# Patient Record
Sex: Male | Born: 1955 | Race: White | Hispanic: No | Marital: Married | Smoking: Former smoker
Health system: Southern US, Community
[De-identification: ages and names within clinical notes are randomized; demographics above are authoritative.]

## PROBLEM LIST (undated history)

## (undated) DIAGNOSIS — K219 Gastro-esophageal reflux disease without esophagitis: Secondary | ICD-10-CM

## (undated) DIAGNOSIS — B019 Varicella without complication: Secondary | ICD-10-CM

## (undated) DIAGNOSIS — N289 Disorder of kidney and ureter, unspecified: Secondary | ICD-10-CM

## (undated) DIAGNOSIS — S43005A Unspecified dislocation of left shoulder joint, initial encounter: Secondary | ICD-10-CM

## (undated) DIAGNOSIS — J45909 Unspecified asthma, uncomplicated: Secondary | ICD-10-CM

## (undated) DIAGNOSIS — I519 Heart disease, unspecified: Secondary | ICD-10-CM

## (undated) DIAGNOSIS — K9041 Non-celiac gluten sensitivity: Secondary | ICD-10-CM

## (undated) DIAGNOSIS — T7840XA Allergy, unspecified, initial encounter: Secondary | ICD-10-CM

## (undated) DIAGNOSIS — L309 Dermatitis, unspecified: Secondary | ICD-10-CM

## (undated) DIAGNOSIS — I2510 Atherosclerotic heart disease of native coronary artery without angina pectoris: Secondary | ICD-10-CM

## (undated) DIAGNOSIS — E785 Hyperlipidemia, unspecified: Secondary | ICD-10-CM

## (undated) HISTORY — DX: Atherosclerotic heart disease of native coronary artery without angina pectoris: I25.10

## (undated) HISTORY — DX: Gastro-esophageal reflux disease without esophagitis: K21.9

## (undated) HISTORY — DX: Disorder of kidney and ureter, unspecified: N28.9

## (undated) HISTORY — PX: VASECTOMY: SHX75

## (undated) HISTORY — PX: OTHER SURGICAL HISTORY: SHX169

## (undated) HISTORY — PX: COLONOSCOPY: SHX174

## (undated) HISTORY — DX: Hyperlipidemia, unspecified: E78.5

## (undated) HISTORY — DX: Unspecified asthma, uncomplicated: J45.909

## (undated) HISTORY — DX: Allergy, unspecified, initial encounter: T78.40XA

## (undated) HISTORY — DX: Varicella without complication: B01.9

## (undated) HISTORY — PX: ELBOW SURGERY: SHX618

## (undated) HISTORY — DX: Non-celiac gluten sensitivity: K90.41

## (undated) HISTORY — DX: Dermatitis, unspecified: L30.9

## (undated) HISTORY — DX: Unspecified dislocation of left shoulder joint, initial encounter: S43.005A

## (undated) HISTORY — DX: Heart disease, unspecified: I51.9

---
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
## 2008-02-03 HISTORY — PX: TOE SURGERY: SHX1073

## 2010-04-15 DIAGNOSIS — K9041 Non-celiac gluten sensitivity: Secondary | ICD-10-CM | POA: Insufficient documentation

## 2010-04-15 DIAGNOSIS — K219 Gastro-esophageal reflux disease without esophagitis: Secondary | ICD-10-CM | POA: Insufficient documentation

## 2010-04-15 DIAGNOSIS — J302 Other seasonal allergic rhinitis: Secondary | ICD-10-CM | POA: Insufficient documentation

## 2010-09-17 DIAGNOSIS — M6740 Ganglion, unspecified site: Secondary | ICD-10-CM | POA: Insufficient documentation

## 2010-09-17 HISTORY — DX: Ganglion, unspecified site: M67.40

## 2013-12-21 ENCOUNTER — Ambulatory Visit: Payer: Self-pay | Admitting: Family Medicine

## 2015-11-06 ENCOUNTER — Encounter: Payer: Self-pay | Admitting: Gastroenterology

## 2015-11-27 ENCOUNTER — Ambulatory Visit (AMBULATORY_SURGERY_CENTER): Payer: Self-pay | Admitting: *Deleted

## 2015-11-27 VITALS — Ht 65.0 in | Wt 152.0 lb

## 2015-11-27 DIAGNOSIS — Z1211 Encounter for screening for malignant neoplasm of colon: Secondary | ICD-10-CM

## 2015-11-27 MED ORDER — NA SULFATE-K SULFATE-MG SULF 17.5-3.13-1.6 GM/177ML PO SOLN: ORAL | 0 refills | Status: DC

## 2015-11-27 NOTE — Progress Notes (Signed)
Patient denies any allergies to eggs or soy. Patient denies any problems with anesthesia/sedation. Patient denies any oxygen use at home and does not take any diet/weight loss medications.  

## 2015-12-19 ENCOUNTER — Encounter: Payer: Self-pay | Admitting: Gastroenterology

## 2015-12-24 ENCOUNTER — Encounter: Payer: Self-pay | Admitting: Gastroenterology

## 2015-12-30 ENCOUNTER — Encounter: Payer: Self-pay | Admitting: Gastroenterology

## 2015-12-30 ENCOUNTER — Ambulatory Visit (AMBULATORY_SURGERY_CENTER): Payer: Managed Care, Other (non HMO) | Admitting: Gastroenterology

## 2015-12-30 VITALS — BP 112/63 | HR 59 | Temp 97.1°F | Resp 11 | Ht 65.0 in | Wt 152.0 lb

## 2015-12-30 DIAGNOSIS — Z1211 Encounter for screening for malignant neoplasm of colon: Secondary | ICD-10-CM

## 2015-12-30 DIAGNOSIS — Z1212 Encounter for screening for malignant neoplasm of rectum: Secondary | ICD-10-CM | POA: Diagnosis not present

## 2015-12-30 DIAGNOSIS — K649 Unspecified hemorrhoids: Secondary | ICD-10-CM | POA: Diagnosis not present

## 2015-12-30 MED ORDER — SODIUM CHLORIDE 0.9 % IV SOLN: 500.0000 mL | INTRAVENOUS | Status: DC

## 2015-12-30 NOTE — Op Note (Signed)
[facility] Patient Name: Eric ChildsDaniel [name] Procedure Date: 12/30/2015 8:12 AM MRN: [id number] Endoscopist: Rachael Feeaniel P Jacobs , MD Age: 60 Referring MD:  Date of Birth: 12/06/1955 Gender: Male Account #: [id number] Procedure:                Colonoscopy Indications:              Screening for colorectal malignant neoplasm;                            colonoscopy 10 years ago (elsewhere) was normal Medicines:                Monitored Anesthesia Care Procedure:                Pre-Anesthesia Assessment:                           - Prior to the procedure, a History and Physical                            was performed, and patient medications and                            allergies were reviewed. The patient's tolerance of                            previous anesthesia was also reviewed. The risks                            and benefits of the procedure and the sedation                            options and risks were discussed with the patient.                            All questions were answered, and informed consent                            was obtained. Prior Anticoagulants: The patient has                            taken no previous anticoagulant or antiplatelet                            agents. ASA Grade Assessment: II - A patient with                            mild systemic disease. After reviewing the risks                            and benefits, the patient was deemed in                            satisfactory condition to undergo the procedure.  After obtaining informed consent, the colonoscope                            was passed under direct vision. Throughout the                            procedure, the patient's blood pressure, pulse, and                            oxygen saturations were monitored continuously. The                            Model CF-HQ190L [id number]) scope was introduced                            through the anus and  advanced to the the cecum,                            identified by appendiceal orifice and ileocecal                            valve. The colonoscopy was performed without                            difficulty. The patient tolerated the procedure                            well. The quality of the bowel preparation was                            excellent. The ileocecal valve, appendiceal                            orifice, and rectum were photographed. Scope In: 8:24:35 AM Scope Out: 8:32:29 AM Scope Withdrawal Time: 0 hours 6 minutes 3 seconds  Total Procedure Duration: 0 hours 7 minutes 54 seconds  Findings:                 Internal hemorrhoids were found. The hemorrhoids                            were small.                           The exam was otherwise without abnormality on                            direct and retroflexion views. Complications:            No immediate complications. Estimated blood loss:                            None. Estimated Blood Loss:     Estimated blood loss: none. Impression:               - Internal hemorrhoids.                           -  The examination was otherwise normal on direct                            and retroflexion views.                           - No polyps or cancers. Recommendation:           - Patient has a contact number available for                            emergencies. The signs and symptoms of potential                            delayed complications were discussed with the                            patient. Return to normal activities tomorrow.                            Written discharge instructions were provided to the                            patient.                           - Resume previous diet.                           - Continue present medications.                           - Repeat colonoscopy in 10 years for screening                            purposes. There is no need for colon cancer                             screening by any method (including stool testing)                            prior to then. Rachael Fee, MD 12/30/2015 8:35:11 AM This report has been signed electronically.

## 2015-12-30 NOTE — Progress Notes (Signed)
Report given to PACU RN, vss 

## 2015-12-30 NOTE — Patient Instructions (Signed)
YOU HAD AN ENDOSCOPIC PROCEDURE TODAY AT THE [facility]:   Refer to the procedure report that was given to you for any specific questions about what was found during the examination.  If the procedure report does not answer your questions, please call your gastroenterologist to clarify.  If you requested that your care partner not be given the details of your procedure findings, then the procedure report has been included in a sealed envelope for you to review at your convenience later.  YOU SHOULD EXPECT: Some feelings of bloating in the abdomen. Passage of more gas than usual.  Walking can help get rid of the air that was put into your GI tract during the procedure and reduce the bloating. If you had a lower endoscopy (such as a colonoscopy or flexible sigmoidoscopy) you may notice spotting of blood in your stool or on the toilet paper. If you underwent a bowel prep for your procedure, you may not have a normal bowel movement for a few days.  Please Note:  You might notice some irritation and congestion in your nose or some drainage.  This is from the oxygen used during your procedure.  There is no need for concern and it should clear up in a day or so.  SYMPTOMS TO REPORT IMMEDIATELY:   Following lower endoscopy (colonoscopy or flexible sigmoidoscopy):  Excessive amounts of blood in the stool  Significant tenderness or worsening of abdominal pains  Swelling of the abdomen that is new, acute  Fever of 100F or higher  For urgent or emergent issues, a gastroenterologist can be reached at any hour by calling [phone].  DIET:  We do recommend a small meal at first, but then you may proceed to your regular diet.  Drink plenty of fluids but you should avoid alcoholic beverages for 24 hours.  ACTIVITY:  You should plan to take it easy for the rest of today and you should NOT DRIVE or use heavy machinery until tomorrow (because of the sedation medicines used during the test).     FOLLOW UP: Our staff will call the number listed on your records the next business day following your procedure to check on you and address any questions or concerns that you may have regarding the information given to you following your procedure. If we do not reach you, we will leave a message.  However, if you are feeling well and you are not experiencing any problems, there is no need to return our call.  We will assume that you have returned to your regular daily activities without incident.  SIGNATURES/CONFIDENTIALITY: You and/or your care partner have signed paperwork which will be entered into your electronic medical record.  These signatures attest to the fact that that the information above on your After Visit Summary has been reviewed and is understood.  Full responsibility of the confidentiality of this discharge information lies with you and/or your care-partner.  Please continue your normal medications  Next colonoscopy- 10 years  Please read over handouts about hemorrhoids and high fiber diet

## 2015-12-31 ENCOUNTER — Telehealth: Payer: Self-pay

## 2015-12-31 NOTE — Telephone Encounter (Signed)
  Follow up Call-  Call back number 12/30/2015  Post procedure Call Back phone  # [phone]  Permission to leave phone message Yes  Some recent data might be hidden    Patient was called for follow up after his procedure on 12/30/2015. No answer at the number given for follow up phone call. A message was left on the answering machine.

## 2015-12-31 NOTE — Telephone Encounter (Signed)
  Follow up Call-  Call back number 12/30/2015  Post procedure Call Back phone  # [phone]  Permission to leave phone message Yes  Some recent data might be hidden    Patient was called for follow up after her procedure on 12/30/2015. No answer at the number given for follow up phone call. A message was left on the answering machine.

## 2016-06-22 ENCOUNTER — Telehealth: Payer: Self-pay

## 2016-06-22 NOTE — Telephone Encounter (Signed)
SENT NOTES TO SCHEDULING 

## 2016-06-24 ENCOUNTER — Telehealth: Payer: Self-pay | Admitting: Cardiology

## 2016-06-24 NOTE — Telephone Encounter (Signed)
Received records from [facility] for appointment on 07/23/16 with Dr Herbie BaltimoreHarding.  Records put with Dr Elissa HeftyHarding's schedule for 07/23/16. lp

## 2016-07-23 ENCOUNTER — Encounter: Payer: Self-pay | Admitting: Cardiology

## 2016-07-23 ENCOUNTER — Ambulatory Visit (INDEPENDENT_AMBULATORY_CARE_PROVIDER_SITE_OTHER): Payer: Managed Care, Other (non HMO) | Admitting: Cardiology

## 2016-07-23 VITALS — BP 139/95 | HR 59 | Ht 65.0 in | Wt 161.0 lb

## 2016-07-23 DIAGNOSIS — E785 Hyperlipidemia, unspecified: Secondary | ICD-10-CM | POA: Insufficient documentation

## 2016-07-23 DIAGNOSIS — Z8249 Family history of ischemic heart disease and other diseases of the circulatory system: Secondary | ICD-10-CM | POA: Diagnosis not present

## 2016-07-23 HISTORY — DX: Hyperlipidemia, unspecified: E78.5

## 2016-07-23 NOTE — Progress Notes (Signed)
PCP: Shirlean MylarWebb, Carol, MD  Clinic Note: Chief Complaint  Patient presents with  . New Evaluation    family history of CAD    HPI: Eric [name] is a 61 y.o. male who is being seen today for the evaluation of baseline CVD risk with Fam Hx of CAD at the request of Shirlean MylarWebb, Carol, MD. Eric [name] himself is been relatively healthy, but is concerned about his extensive family history of CAD noted below. His father, 4 paternal uncles and paternal grandfather all with MIs and most with early deaths related to CAD. He himself has lived longer than most of his paternal uncles. He is very active exercising routinely, and has not had any sensation of anginal symptoms. [place]  Eric [name] was last seen on May 18 by Dr. Hyman HopesWebb for routine follow-up visit/preventive medicine. He noted that time having some decreased exercise tolerance and is concerned about his risk factors. He is therefore referred for baseline screening.  Recent Hospitalizations: None  Studies Personally Reviewed - (if available, images/films reviewed: From Epic Chart or Care Everywhere)  None  Interval History: Eric [name] presents here today noting maybe just a little bit of decreased exercise tolerance. He routinely does running, cycling was swimming and golf. Over the last year or so he is tolerance to exercises on more notably. He still is able to run may be 3 miles a day, but ever since he had a significant lifestyle changes because of his job changing with more frequent traveling and less ability to exercise. Since then try to get back to where he was, he is not able to run the 8 and half-9 minute miles that he used to be a do, is only doing 10 minute miles. The is barely able to do his 50 mile bike rides. She used to be routine. Basically what is noting now as he is not improving as fast as he used to and is seemed to plateau sooner with his heart rate and energy level. He sometimes has to stop. Overall  he thinks it is getting better, but he is low but concerned with his family history that his inability to improve is a sign of developing coronary disease. He also notes that some of this exercise intolerance is related to bilateral (R>L) knee pain  No chest pain or shortness of breath with rest or exertion.  No PND, orthopnea or edema. No palpitations, lightheadedness, dizziness, weakness or syncope/near syncope. No TIA/amaurosis fugax symptoms. No melena, hematochezia, hematuria, or epstaxis. No claudication.  ROS: A comprehensive was performed. Review of Systems  Constitutional: Negative for malaise/fatigue.  HENT: Negative for nosebleeds.   Respiratory: Negative for cough, shortness of breath and wheezing.   Cardiovascular: Negative.   Gastrointestinal: Negative for abdominal pain and constipation.  Genitourinary: Negative for dysuria and frequency.  Musculoskeletal: Positive for joint pain (Knee pain).  Neurological: Negative for dizziness and weakness.  Psychiatric/Behavioral: Negative.   All other systems reviewed and are negative.   I have reviewed and (if needed) personally updated the patient's problem list, medications, allergies, past medical and surgical history, social and family history.   Past Medical History:  Diagnosis Date  . Coronary artery disease   . Dislocation of shoulder, left, closed   . GERD (gastroesophageal reflux disease)   . Gluten intolerance    Leads to diffuse GI issues    Past Surgical History:  Procedure Laterality Date  . COLONOSCOPY  10 years ago=Normal  done in [place]; pt unsure MD name, he will look for records.  Marland Kitchen MOUTH SURGERY  1965  . TOE SURGERY      Current Meds  Medication Sig  . omeprazole (PRILOSEC) 40 MG capsule Take 40 mg by mouth daily.   Current Facility-Administered Medications for the 07/23/16 encounter (Office Visit) with [name], MD  Medication  . 0.9 %  sodium chloride infusion    Allergies  Allergen  Reactions  . Gluten Meal Diarrhea and Other (See Comments)    GI    Social History   Social History  . Marital status: Married    Spouse name: N/A  . Number of children: 4  . Years of education: N/A   Occupational History  . Surveyor, mining   Social History Main Topics  . Smoking status: Former Games developer  . Smokeless tobacco: Never Used  . Alcohol use 4.2 oz/week    7 Glasses of wine per week  . Drug use: No  . Sexual activity: Not Asked   Other Topics Concern  . None   Social History Narrative   He is been married for 37 years. Therefore children and 1 grandchild. He does the son and spouse. 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   He never smoked (unless you count when he was 61 years old) he has been exposed to passive smoke with his parents.   He drinks roughly 14 alcohol drinks usually red wine a week.   Used marijuana in high school but none since.   He is an avid exercise enthusiast who does running, bicycling and swimming at least 5 days a week for 40 minutes in time.    family history includes Breast cancer in his mother; Coronary artery disease in his paternal uncle and paternal uncle; Dementia in his father; Heart attack in his father, paternal uncle, and paternal uncle; Heart disease in his father and paternal grandfather; Hypertension in his brother and paternal uncle; Prostate cancer in his father.  Wt Readings from Last 3 Encounters:  07/23/16 161 lb (73 kg)  12/30/15 152 lb (68.9 kg)  11/27/15 152 lb (68.9 kg)    PHYSICAL EXAM BP (!) 139/95 (BP Location: Left Arm, Patient Position: Sitting, Cuff Size: Normal)   Pulse (!) 59   Ht 5\' 5"  (1.651 m)   Wt 161 lb (73 kg)   BMI 26.79 kg/m  General appearance: alert, cooperative, appears stated age, no distress. Well-nourished, well-groomed. Healthy-appearing HEENT: [place]/AT, EOMI, MMM, anicteric sclera Neck: no adenopathy, no carotid bruit  and no JVD Lungs: clear to auscultation bilaterally, normal percussion bilaterally and non-labored Heart: regular rate and rhythm, S1 &S2 normal, no murmur, click, rub or gallop; nondisplaced PMI Abdomen: soft, non-tender; bowel sounds normal; no masses,  no organomegaly; no HJR Extremities: extremities normal, atraumatic, no cyanosis, or edema  Pulses: 2+ and symmetric;  Skin: mobility and turgor normal, no evidence of bleeding or bruising and no lesions noted  Neurologic: Mental status: Alert & oriented x 3, thought content appropriate; non-focal exam.  Pleasant mood & affect. Cranial nerves: normal (II-XII grossly intact)    Adult ECG Report  Rate: 58 ;  Rhythm: sinus bradycardia and Normal axis, intervals and durations;   Narrative Interpretation: Relatively normal   Other studies Reviewed: Additional studies/ records that were reviewed today include:  Recent Labs:  None available  ASSESSMENT / PLAN: Problem List Items Addressed This Visit    Family history of premature CAD - Primary (Chronic)    Pretty asymptomatic, unlikely to have obstructive coronary disease, however for risk stratification, I think is reasonable to evaluate with a coronary calcium score. Depending on what this shows, we may need to be more aggressive lipid management for instance.  Otherwise he continues to be very active with no symptoms. He is not on any cardiac meds because of. I did suggest a daily aspirin.      Relevant Orders   EKG 12-Lead (Completed)   CT CARDIAC SCORING   Hyperlipidemia with target LDL less than 100 (Chronic)    He cannot recall last time his lipids were checked, but for many years he decided that he did not want to take statins because actually intended to lower his HDL but not necessarily LDL. He therefore is leery of using them again future.  We may need to adjust this process pending results of his coronary calcium score.      Relevant Orders   EKG 12-Lead (Completed)        Current medicines are reviewed at length with the patient today. (+/- concerns) n/a The following changes have been made: n/a  Patient Instructions  SCHEDULE AT [address]  Your physician has requested that you have CORONARY Calcium scoring. Cardiac computed tomography (CT) is a painless test that uses an x-ray machine to take clear, detailed pictures of your heart. For further information please visit [email or web address] Please follow instruction sheet as given.    No other changes at present  MAY USE CoQ10 300 MG  DAILY- INCREASE BY 100 MG EVERY 2 WEEKS UNTIL YOU REACH 300 MG DAILY  Your physician recommends that you schedule a follow-up appointment in 1 month with DR Sharri Loya- AFTER TEST COMPLETE.     Studies Ordered:   Orders Placed This Encounter  Procedures  . CT CARDIAC SCORING  . EKG 12-Lead      Bryan Lemma, M.D., M.S. Interventional Cardiologist   Pager # [phone] Phone # [phone] [address] [place], [place] [address]

## 2016-07-23 NOTE — Patient Instructions (Addendum)
SCHEDULE AT [address]  Your physician has requested that you have CORONARY Calcium scoring. Cardiac computed tomography (CT) is a painless test that uses an x-ray machine to take clear, detailed pictures of your heart. For further information please visit [email or web address] Please follow instruction sheet as given.    No other changes at present  MAY USE CoQ10 300 MG  DAILY- INCREASE BY 100 MG EVERY 2 WEEKS UNTIL YOU REACH 300 MG DAILY  Your physician recommends that you schedule a follow-up appointment in 1 month with DR HARDING- AFTER TEST COMPLETE.

## 2016-07-24 ENCOUNTER — Encounter: Payer: Self-pay | Admitting: Cardiology

## 2016-07-24 NOTE — Assessment & Plan Note (Signed)
Pretty asymptomatic, unlikely to have obstructive coronary disease, however for risk stratification, I think is reasonable to evaluate with a coronary calcium score. Depending on what this shows, we may need to be more aggressive lipid management for instance.  Otherwise he continues to be very active with no symptoms. He is not on any cardiac meds because of. I did suggest a daily aspirin.

## 2016-07-24 NOTE — Assessment & Plan Note (Signed)
He cannot recall last time his lipids were checked, but for many years he decided that he did not want to take statins because actually intended to lower his HDL but not necessarily LDL. He therefore is leery of using them again future.  We may need to adjust this process pending results of his coronary calcium score.

## 2016-08-07 ENCOUNTER — Other Ambulatory Visit: Payer: Managed Care, Other (non HMO)

## 2016-08-10 ENCOUNTER — Ambulatory Visit (INDEPENDENT_AMBULATORY_CARE_PROVIDER_SITE_OTHER)
Admission: RE | Admit: 2016-08-10 | Discharge: 2016-08-10 | Disposition: A | Discharge status: Home / Self Care | Payer: Self-pay | Source: Ambulatory Visit | Attending: Cardiology | Admitting: Cardiology

## 2016-08-10 DIAGNOSIS — Z8249 Family history of ischemic heart disease and other diseases of the circulatory system: Secondary | ICD-10-CM

## 2016-08-13 ENCOUNTER — Encounter: Payer: Self-pay | Admitting: Cardiology

## 2016-08-13 DIAGNOSIS — Z8249 Family history of ischemic heart disease and other diseases of the circulatory system: Secondary | ICD-10-CM

## 2016-08-13 DIAGNOSIS — R931 Abnormal findings on diagnostic imaging of heart and coronary circulation: Secondary | ICD-10-CM

## 2016-08-17 ENCOUNTER — Encounter: Payer: Self-pay | Admitting: Cardiology

## 2016-08-18 ENCOUNTER — Telehealth: Payer: Self-pay | Admitting: Cardiology

## 2016-08-18 NOTE — Telephone Encounter (Signed)
New Message  Pt call requesting to speak with RN. Pt states he was asked to call office after 11am today per Dr. Herbie BaltimoreHarding. Please call back to discuss

## 2016-08-18 NOTE — Telephone Encounter (Signed)
Dr Harding's pt Eric [name]  

## 2016-08-18 NOTE — Telephone Encounter (Signed)
The caution and put forward for me. I talked to him for a long time

## 2016-08-21 ENCOUNTER — Encounter: Payer: Self-pay | Admitting: Cardiology

## 2016-08-21 ENCOUNTER — Telehealth: Payer: Self-pay

## 2016-08-21 DIAGNOSIS — Z8249 Family history of ischemic heart disease and other diseases of the circulatory system: Secondary | ICD-10-CM

## 2016-08-21 DIAGNOSIS — Z01818 Encounter for other preprocedural examination: Secondary | ICD-10-CM

## 2016-08-21 NOTE — Telephone Encounter (Signed)
Patient needs BMET before CT.

## 2016-08-28 ENCOUNTER — Other Ambulatory Visit: Payer: Managed Care, Other (non HMO) | Admitting: *Deleted

## 2016-08-28 ENCOUNTER — Other Ambulatory Visit: Payer: Self-pay

## 2016-08-28 DIAGNOSIS — Z8249 Family history of ischemic heart disease and other diseases of the circulatory system: Secondary | ICD-10-CM

## 2016-08-28 DIAGNOSIS — Z01818 Encounter for other preprocedural examination: Secondary | ICD-10-CM

## 2016-08-28 DIAGNOSIS — R931 Abnormal findings on diagnostic imaging of heart and coronary circulation: Secondary | ICD-10-CM

## 2016-08-28 LAB — BASIC METABOLIC PANEL
BUN/Creatinine Ratio: 20 (ref 10–24)
BUN: 23 mg/dL (ref 8–27)
CHLORIDE: 103 mmol/L (ref 96–106)
CO2: 21 mmol/L (ref 20–29)
Calcium: 9.4 mg/dL (ref 8.6–10.2)
Creatinine, Ser: 1.13 mg/dL (ref 0.76–1.27)
GFR calc Af Amer: 81 mL/min/{1.73_m2} (ref >59)
GFR calc non Af Amer: 70 mL/min/{1.73_m2} (ref >59)
GLUCOSE: 94 mg/dL (ref 65–99)
POTASSIUM: 4.5 mmol/L (ref 3.5–5.2)
SODIUM: 139 mmol/L (ref 134–144)

## 2016-08-28 NOTE — Addendum Note (Signed)
Addended by: Virl AxePATE INGALLS, Dorcas Melito L on: 08/28/2016 11:07 AM   Modules accepted: Orders

## 2016-09-02 ENCOUNTER — Ambulatory Visit: Payer: Managed Care, Other (non HMO) | Admitting: Cardiology

## 2016-09-03 ENCOUNTER — Ambulatory Visit (HOSPITAL_COMMUNITY): Admission: RE | Admit: 2016-09-03 | Payer: Managed Care, Other (non HMO) | Source: Ambulatory Visit

## 2016-09-03 ENCOUNTER — Encounter (HOSPITAL_COMMUNITY): Payer: Self-pay

## 2016-09-03 ENCOUNTER — Ambulatory Visit (HOSPITAL_COMMUNITY)
Admission: RE | Admit: 2016-09-03 | Discharge: 2016-09-03 | Disposition: A | Discharge status: Home / Self Care | Payer: Managed Care, Other (non HMO) | Source: Ambulatory Visit | Attending: Cardiology | Admitting: Cardiology

## 2016-09-03 DIAGNOSIS — Z8249 Family history of ischemic heart disease and other diseases of the circulatory system: Secondary | ICD-10-CM | POA: Diagnosis present

## 2016-09-03 DIAGNOSIS — I2510 Atherosclerotic heart disease of native coronary artery without angina pectoris: Secondary | ICD-10-CM | POA: Diagnosis not present

## 2016-09-03 DIAGNOSIS — R931 Abnormal findings on diagnostic imaging of heart and coronary circulation: Secondary | ICD-10-CM | POA: Diagnosis not present

## 2016-09-03 DIAGNOSIS — R079 Chest pain, unspecified: Secondary | ICD-10-CM

## 2016-09-03 MED ORDER — NITROGLYCERIN 0.4 MG SL SUBL
SUBLINGUAL_TABLET | SUBLINGUAL | Status: AC
  Filled 2016-09-03: qty 2

## 2016-09-03 MED ORDER — IOPAMIDOL (ISOVUE-370) INJECTION 76%
INTRAVENOUS | Status: AC
  Administered 2016-09-03: 80 mL
  Filled 2016-09-03: qty 100

## 2016-09-03 MED ORDER — NITROGLYCERIN 0.4 MG SL SUBL
0.8000 mg | SUBLINGUAL_TABLET | Freq: Once | SUBLINGUAL | Status: AC
  Administered 2016-09-03: 0.8 mg via SUBLINGUAL

## 2016-09-09 ENCOUNTER — Ambulatory Visit (INDEPENDENT_AMBULATORY_CARE_PROVIDER_SITE_OTHER): Payer: Managed Care, Other (non HMO) | Admitting: Cardiology

## 2016-09-09 ENCOUNTER — Encounter: Payer: Self-pay | Admitting: Cardiology

## 2016-09-09 VITALS — BP 119/81 | HR 56 | Ht 65.0 in | Wt 153.4 lb

## 2016-09-09 DIAGNOSIS — I2510 Atherosclerotic heart disease of native coronary artery without angina pectoris: Secondary | ICD-10-CM | POA: Diagnosis not present

## 2016-09-09 DIAGNOSIS — Z8249 Family history of ischemic heart disease and other diseases of the circulatory system: Secondary | ICD-10-CM | POA: Diagnosis not present

## 2016-09-09 DIAGNOSIS — E785 Hyperlipidemia, unspecified: Secondary | ICD-10-CM | POA: Diagnosis not present

## 2016-09-09 NOTE — Progress Notes (Signed)
PCP: Shirlean Mylar, MD  Clinic Note: Chief Complaint  Patient presents with  . Follow-up    CV Risk stratification - Coronary Calcium Score    HPI: Eric [name] is a 61 y.o. male with a PMH below who presents today for Follow-up evaluation of baseline cardiovascular risk with family history of coronary disease at the request of Shirlean Mylar, MD.  He noted mild exertional dyspnea & exercise intolerance.  THADIUS SMISEK was last seen on June 21, 20187  Recent Hospitalizations: None  Studies Personally Reviewed - (if available, images/films reviewed: From Epic Chart or Care Everywhere) Coronary Calcium Score & CTA: 1) Calcium score 147 which is 74th percentile for age and sex 2) Right dominant coronary arteries with Less than 30% calcified plaque in proximal LAD. Less than 50% calcified plaque in proximal RCA, Less than 30% soft plauqe in mid RCA and Less than 30% calcified plaque in distal RCA  Interval History: Mr. [name] returns to discuss the results of his coronary CTA. He states that since he retired in June, he is now made total lifestyle change. He was concerned about a total cholesterol level being 245 last time. He now has started exercising and is dramatically change his diet. He is stop alcohol. He is no longer doing the travel trips where he's been eating out and drinking at social gatherings. He is now exercising despite every day and has lost 10 pounds. As such he has been totally asymptomatic from cardiac standpoint.  Cardiovascular review of symptoms: No chest pain or shortness of breath with rest or exertion. No PND, orthopnea or edema. No palpitations, lightheadedness, dizziness, weakness or syncope/near syncope. No TIA/amaurosis fugax symptoms. No claudication.  ROS: A comprehensive was performed. Pertinent symptoms noted above Review of Systems  Constitutional: Positive for weight loss (Intentional).  Musculoskeletal: Positive for joint pain (If he overdoes it with  exercise).  All other systems reviewed and are negative.   I have reviewed and (if needed) personally updated the patient's problem list, medications, allergies, past medical and surgical history, social and family history.   Past Medical History:  Diagnosis Date  . Coronary artery disease, non-occlusive    Elevated Coronary Calcium Score - Non-obstructive CAD (30-50%) on Coronary CTA  . Dislocation of shoulder, left, closed   . GERD (gastroesophageal reflux disease)   . Gluten intolerance    Leads to diffuse GI issues    Past Surgical History:  Procedure Laterality Date  . COLONOSCOPY  10 years ago=Normal   done in [place]; pt unsure MD name, he will look for records.  . Coronary CT Angiograpm  July-August 2018   1) Calcium score 147 which is 74th percentile for age and sex 2) Right dominant coronary arteries with Less than 30% calcified plaque in proximal LAD. Less than 50% calcified plaque in proximal RCA, Less than 30% soft plauqe in mid RCA and Less than 30% calcified plaque in distal RCA  . MOUTH SURGERY  1965  . TOE SURGERY      Current Meds  Medication Sig  . omeprazole (PRILOSEC) 40 MG capsule Take 40 mg by mouth daily.   Current Facility-Administered Medications for the 09/09/16 encounter (Office Visit) with [name], MD  Medication  . 0.9 %  sodium chloride infusion    Allergies  Allergen Reactions  . Gluten Meal Diarrhea and Other (See Comments)    GI    Social History   Social History  . Marital status: Married  Spouse name: N/A  . Number of children: 4  . Years of education: N/A   Occupational History  . Surveyor, mining   Social History Main Topics  . Smoking status: Former Games developer  . Smokeless tobacco: Never Used  . Alcohol use 4.2 oz/week    7 Glasses of wine per week  . Drug use: No  . Sexual activity: Not Asked   Other Topics Concern  . None   Social History Narrative   He is been married for 37 years. Therefore children  and 1 grandchild. He does the son and spouse. 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   He never smoked (unless you count when he was 61 years old) he has been exposed to passive smoke with his parents.   He drinks roughly 14 alcohol drinks usually red wine a week.   Used marijuana in high school but none since.   He is an avid exercise enthusiast who does running, bicycling and swimming at least 5 days a week for 40 minutes in time.    family history includes Breast cancer in his mother; Coronary artery disease in his paternal uncle and paternal uncle; Dementia in his father; Heart attack in his father, paternal uncle, and paternal uncle; Heart disease in his father and paternal grandfather; Hypertension in his brother and paternal uncle; Prostate cancer in his father.  Wt Readings from Last 3 Encounters:  09/09/16 153 lb 6.4 oz (69.6 kg)  07/23/16 161 lb (73 kg)  12/30/15 152 lb (68.9 kg)    PHYSICAL EXAM BP 119/81   Pulse (!) 56   Ht 5\' 5"  (1.651 m)   Wt 153 lb 6.4 oz (69.6 kg)   BMI 25.53 kg/m  Physical Exam  Constitutional: He is oriented to person, place, and time. He appears well-developed and well-nourished. No distress.  Healthy-appearing.  HENT:  Head: Normocephalic and atraumatic.  Neck: No hepatojugular reflux and no JVD present. Carotid bruit is not present.  Cardiovascular: Normal rate, regular rhythm, normal heart sounds and intact distal pulses.  Exam reveals no gallop and no friction rub.   No murmur heard. Pulmonary/Chest: Effort normal and breath sounds normal. No respiratory distress. He has no wheezes.  Musculoskeletal: Normal range of motion. He exhibits no edema.  Neurological: He is alert and oriented to person, place, and time.  Skin: Skin is warm and dry.  Psychiatric: He has a normal mood and affect. His behavior is normal. Judgment and thought content normal.  Nursing note and vitals  reviewed.     Adult ECG Report  N/A  Other studies Reviewed: Additional studies/ records that were reviewed today include:  Recent Labs:   Labs last checked by PCP in ~May - not available   ASSESSMENT / PLAN: Problem List Items Addressed This Visit    Coronary artery disease, non-occlusive - Primary (Chronic)    Now that he has evidence of CAD and not just coarse calcification, we do need to be more aggressive risk factor modification. The very fact that he has lost 10 pounds since I last saw him with diet and exercise indicates that he has made lifestyle modification changes. I would recommend continued follow his lipid levels and blood pressure with these changes. Currently his blood pressure looks well at goal., His weight is also stable.  Recommend daily aspirin         Family  history of premature CAD (Chronic)   Hyperlipidemia with target LDL less than 100 (Chronic)    Would recheck lipids in roughly 6 months from the last check. If he does not make an appreciable drop, would consider initiating statin. If he does not have a significant drop, would probably suggest rosuvastatin to avoid starting of the potential side effects.  Target LDL should be less than 100, closer to 70.         We spent 25+ minutes today talking about the results of the study and pathophysiology of CAD as well as restrictive modification. We discussed that his ongoing care and risks. > 50% of the time was spent in this detailed conversation  Current medicines are reviewed at length with the patient today. (+/- concerns) n/a The following changes have been made: n/a  Patient Instructions  No Changes with  Current treatment    Your physician recommends that you schedule a follow-up appointment on an as needed basis    Studies Ordered:   No orders of the defined types were placed in this encounter.     Bryan Lemmaavid Harding, M.D., M.S. Interventional Cardiologist   Pager # [phone] Phone  # [phone] [address] [place], [place] [address]

## 2016-09-09 NOTE — Patient Instructions (Signed)
No Changes with  Current treatment    Your physician recommends that you schedule a follow-up appointment on an as needed basis

## 2016-09-11 ENCOUNTER — Encounter: Payer: Self-pay | Admitting: Cardiology

## 2016-09-11 DIAGNOSIS — I2510 Atherosclerotic heart disease of native coronary artery without angina pectoris: Secondary | ICD-10-CM | POA: Insufficient documentation

## 2016-09-11 NOTE — Assessment & Plan Note (Addendum)
Now that he has evidence of CAD and not just coarse calcification, we do need to be more aggressive risk factor modification. The very fact that he has lost 10 pounds since I last saw him with diet and exercise indicates that he has made lifestyle modification changes. I would recommend continued follow his lipid levels and blood pressure with these changes. Currently his blood pressure looks well at goal., His weight is also stable.  Recommend daily aspirin

## 2016-09-11 NOTE — Assessment & Plan Note (Signed)
Would recheck lipids in roughly 6 months from the last check. If he does not make an appreciable drop, would consider initiating statin. If he does not have a significant drop, would probably suggest rosuvastatin to avoid starting of the potential side effects.  Target LDL should be less than 100, closer to 70.

## 2017-06-01 ENCOUNTER — Ambulatory Visit
Admission: RE | Admit: 2017-06-01 | Discharge: 2017-06-01 | Disposition: A | Discharge status: Home / Self Care | Payer: 59 | Source: Ambulatory Visit | Attending: Sports Medicine | Admitting: Sports Medicine

## 2017-06-01 ENCOUNTER — Ambulatory Visit (INDEPENDENT_AMBULATORY_CARE_PROVIDER_SITE_OTHER): Payer: 59 | Admitting: Sports Medicine

## 2017-06-01 VITALS — BP 126/82 | Ht 65.0 in | Wt 145.0 lb

## 2017-06-01 DIAGNOSIS — M25561 Pain in right knee: Secondary | ICD-10-CM | POA: Diagnosis not present

## 2017-06-01 NOTE — Progress Notes (Signed)
   Subjective:    Patient ID: Eric [name], male    DOB: Mar 11, 1955, 62 y.o.   MRN: [id number]  HPIchief complaint: Right knee pain  Very pleasant 62 year old runner comes in today complaining of 2 years of intermittent right knee pain. Patient does not recall any specific injury but does describe a sudden onset of pain about 2 years ago. He initially felt some instability in his knee but that has resolved. He now complains of pain along the lateral patella that is specific to going up and down stairs or with squatting. He has no pain while running. He also enjoys biking and swimming. No pain with those activities although he does have some pain after cycling. He denies locking in his knee. He has not noticed any swelling. He denies any prior knee surgery. He denies any injury to this knee in the past. No numbness or tingling. He does have custom orthotics for his running shoes but he failed to bring those with him today.  Past medical history reviewed Medications reviewed Allergies reviewed    Review of Systems As above    Objective:   Physical Exam  Well-developed, well-nourished. No acute distress. Awake alert and oriented 3. Vital signs reviewed  Right knee: Full range of motion. No effusion. Slight VMO weakness. Knee is stable to valgus and varus stressing. Negative anterior drawer, negative posterior drawer. No tenderness along medial lateral joint lines. Negative McMurray's. Negative Thessaly's. Good patellar mobility. Negative patellar grind. Trace patellofemoral crepitus. Neurovascular intact distally.  Evaluation of his gait shows good running form. Slight pronation, especially on the left. No limp.      Assessment & Plan:   Chronic right knee pain  I suspect his symptoms are retropatellar. I'm going to get an x-ray of his knee including a sunrise view to evaluate further. Phone follow-up with those results when available. Follow-up with me in 4 weeks for reevaluation. He  will bring his orthotics with him to that visit. In the meantime, he will start doing daily isometric quad exercises (he has already been doing them 3 times a week). I think he is okay to continue with activity using pain as his guide but he may want to decrease his running and cross-train a little more for the immediate future.

## 2017-06-02 ENCOUNTER — Telehealth: Payer: Self-pay | Admitting: Sports Medicine

## 2017-06-02 NOTE — Telephone Encounter (Signed)
  I spoke with the patient on the phone today after reviewing x-rays of his knee. They are fairly unremarkable. No significant degenerative changes. 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 I recommend that he purchase a patellar strap to wear with running and follow-up with me in 4 weeks. He will do his isometric quad exercises daily. We will discuss his x-rays in further detail at follow-up. If symptoms persist we could consider formal physical therapy or possibly an MRI specifically to evaluate the articular cartilage behind the patella. Patient is in agreement with this plan.

## 2017-07-01 ENCOUNTER — Ambulatory Visit (INDEPENDENT_AMBULATORY_CARE_PROVIDER_SITE_OTHER): Payer: 59 | Admitting: Sports Medicine

## 2017-07-01 VITALS — BP 118/78 | Ht 65.5 in | Wt 143.0 lb

## 2017-07-01 DIAGNOSIS — M25561 Pain in right knee: Secondary | ICD-10-CM

## 2017-07-02 NOTE — Progress Notes (Signed)
   Subjective:    Patient ID: Eric [name], male    DOB: 1955/12/18, 62 y.o.   MRN: [id number]  HPI   Patient comes in today for follow-up on right knee pain. His symptoms have improved but he has not been running as much as he would like due to a recent illness. His x-rays were fairly unremarkable other than patella alta. He has been doing his home exercises. He still endorses pain primarily with going up and down stairs. No swelling. He brought his orthotics in with him today for me to review. He tried using a patellar strap but did not find it useful.   Review of Systems As above    Objective:   Physical Exam  Well-developed, well-nourished. No acute distress. Awake alert and oriented 3. Vital signs reviewed  Right knee: Full range of motion.No effusion. No tenderness to palpation around the patella. No patellofemoral crepitus. No joint line tenderness. Negative McMurray's. Neurovascularly intact distally.  Right hip does demonstrate some mild hip abductor weakness.  Evaluation of his running gait with orthotics in place shows excellent correction of his pronation. He has excellent running form.      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 He is already doing quad strengthening so we will add hip abductor strengthening as well. He may continue all activity as tolerated including running and follow-up with me again in 6 weeks. If symptoms do not continue to improve then consider merits of further diagnostic imaging. Patient will call with questions or concerns in the interim.

## 2017-08-12 ENCOUNTER — Ambulatory Visit (INDEPENDENT_AMBULATORY_CARE_PROVIDER_SITE_OTHER): Payer: 59 | Admitting: Sports Medicine

## 2017-08-12 VITALS — BP 112/82 | Ht 65.5 in | Wt 142.0 lb

## 2017-08-12 DIAGNOSIS — M25561 Pain in right knee: Secondary | ICD-10-CM

## 2017-08-13 NOTE — Progress Notes (Signed)
   Subjective:    Patient ID: Eric [name], male    DOB: 09/12/1955, 62 y.o.   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 Overall, he is slowly improving.He has continued to stay active with running and cycling. He had a rather aggressive ride a few days ago which did cause his knee to get sore but despite this he still notes improvement. Continues to deny swelling. Continues to deny locking or catching. Pain continues to be localized to the anterior knee. He has a good understanding of his home exercises.   Review of Systems    as above Objective:   Physical Exam Well-developed, well-nourished. No acute distress. Awake alert and oriented 3. Vital signs reviewed  Right knee: Full range of motion. No effusion. No significant patellofemoral crepitus.No joint line tenderness. Negative McMurray's. Good quad strength. Neurovascularly intact distally.  Right hip exam shows full painless range of motion with much improved hip abductor strength.      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 I think he can continue to increase his activity as tolerated and follow-up with me as needed.

## 2018-01-04 IMAGING — CT CT HEART SCORING
2 series · 16 of 20 positions shown, 18 images · non-contrast
Comparison: None.

CLINICAL DATA: Risk stratification

EXAM:
Coronary Calcium Score
TECHNIQUE: The patient was scanned on a Siemens Somatom 64 slice scanner. Axial
non-contrast 3 mm slices were carried out through the heart. The
data set was analyzed on a dedicated work station and scored using
the Agatson method.

[Series 2: casc 3.0 i36f 2 bestdiast 69 % · axial · 0.35mm/px · z∈[-219,-120]mm · 8 of 43 slices shown, 10 images]
[im 5/43  vessel]
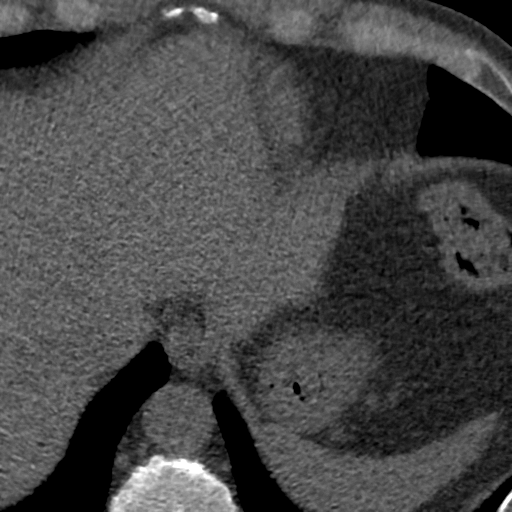
[im 5/43  lung]
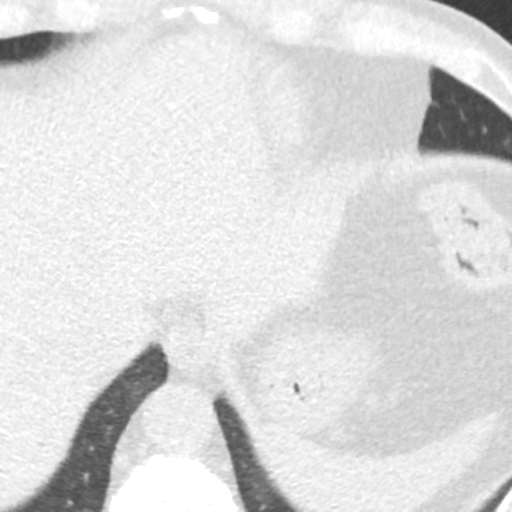
[im 10/43  vessel]
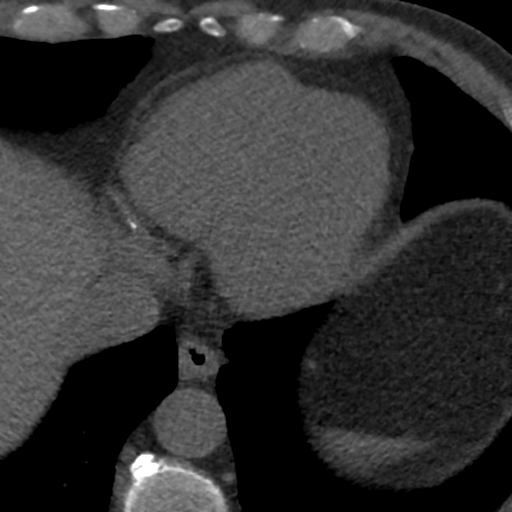
[im 15/43  vessel]
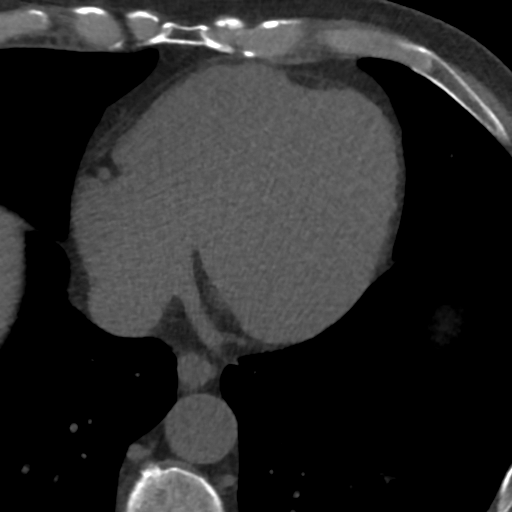
[im 19/43  vessel]
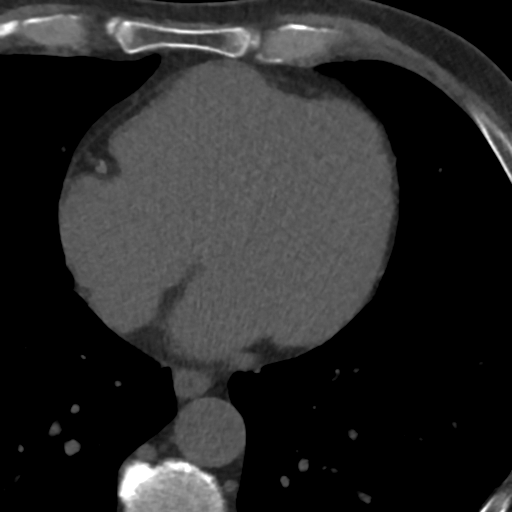
[im 24/43  vessel]
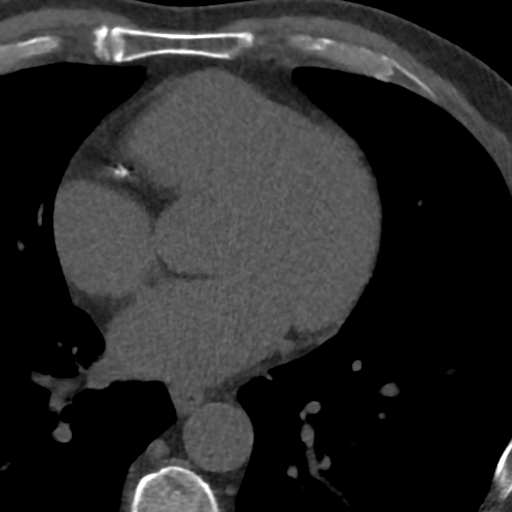
[im 24/43  lung]
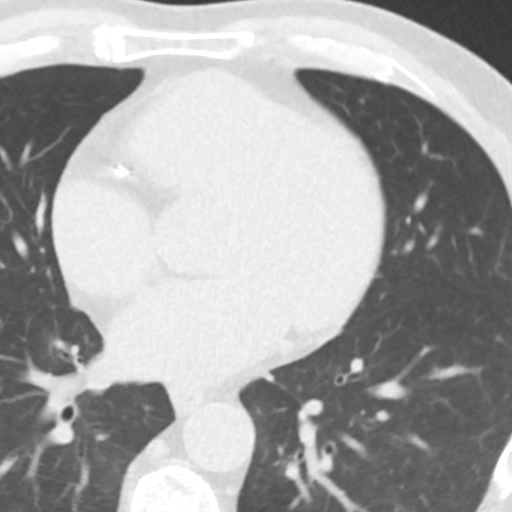
[im 29/43  vessel]
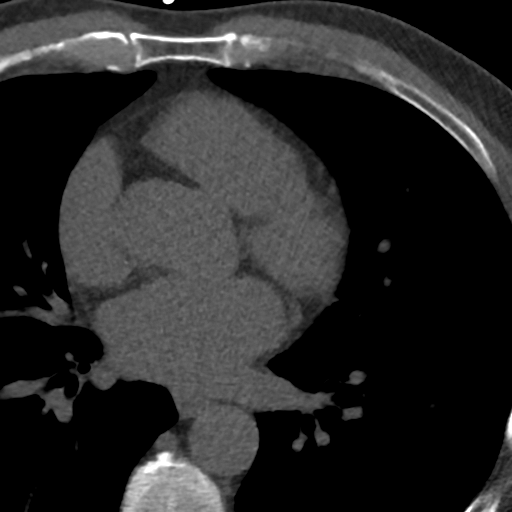
[im 33/43  vessel]
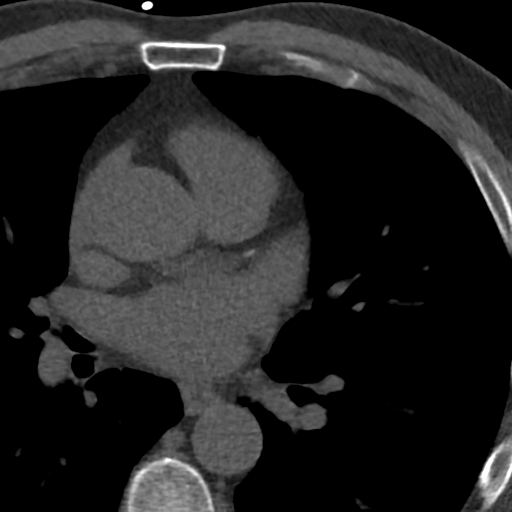
[im 38/43  vessel]
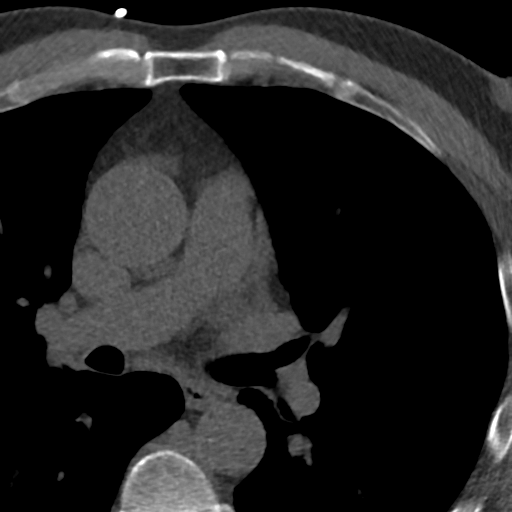

[Series 4: lung st 69 % · axial · 0.66mm/px · z∈[-222,-120]mm · 8 of 44 slices shown]
[im 5/44  lung]
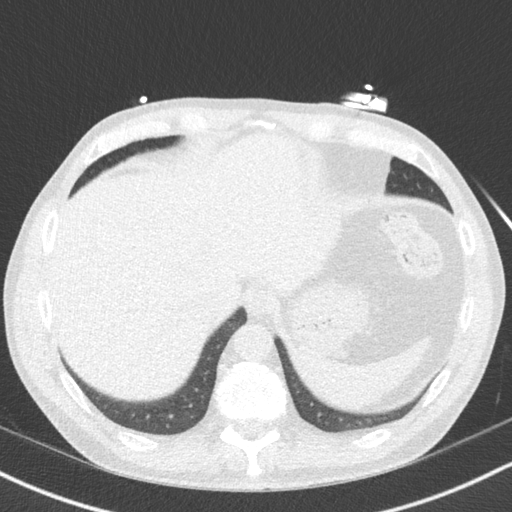
[im 10/44  lung]
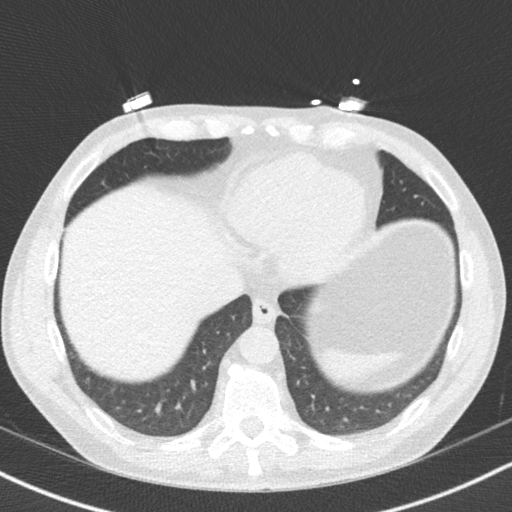
[im 15/44  lung]
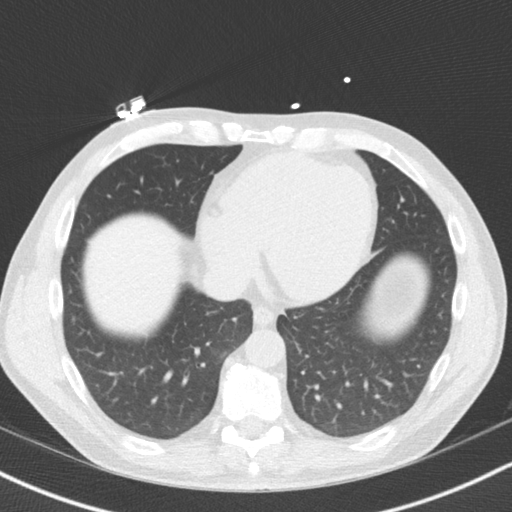
[im 20/44  lung]
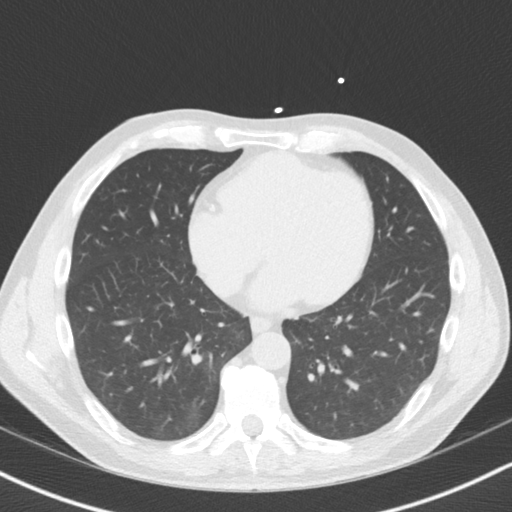
[im 24/44  lung]
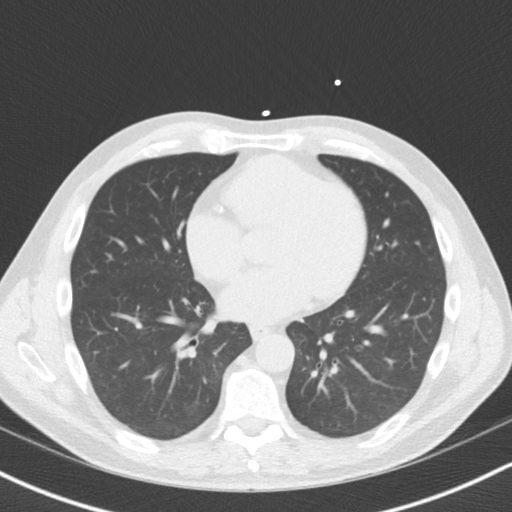
[im 29/44  lung]
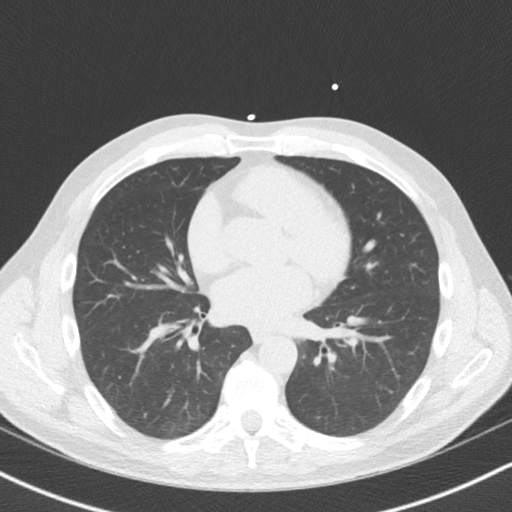
[im 34/44  lung]
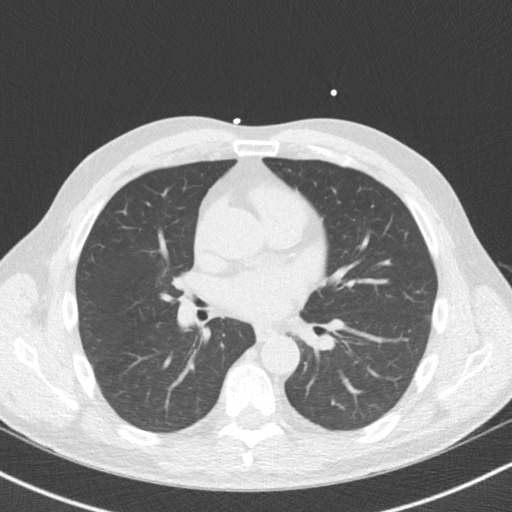
[im 39/44  lung]
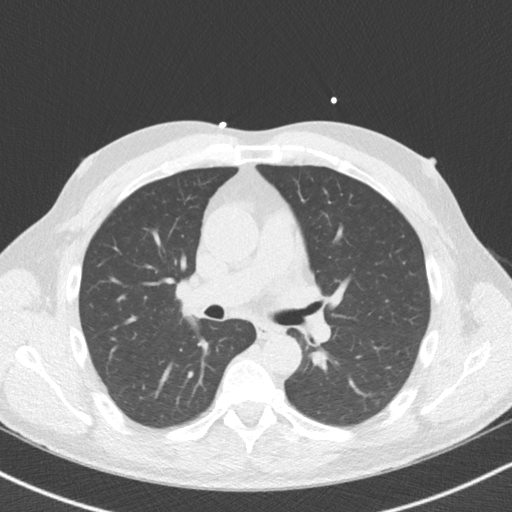

[16 of 20 positions shown; findings below may reference images not displayed]

FINDINGS: Non-cardiac: See separate report from [REDACTED].

Ascending Aorta:  3.7 cm

Pericardium: Normal

Coronary arteries: Calcium noted in proximal, mid and distal RCA as
well as proximal LAD
IMPRESSION: Coronary calcium score of 147. This was 74 th percentile for age and
sex matched control.

Dh Bowen

EXAM:
OVER-READ INTERPRETATION  CT CHEST

The following report is an over-read performed by radiologist Dr.
over-read does not include interpretation of cardiac or coronary
anatomy or pathology. The coronary calcium score interpretation by
the cardiologist is attached.
FINDINGS: Within the visualized portions of the thorax there are no suspicious
appearing pulmonary nodules or masses, there is no acute
consolidative airspace disease, no pleural effusions, no
pneumothorax and no lymphadenopathy. Visualized portions of the
upper abdomen are unremarkable. There are no aggressive appearing
lytic or blastic lesions noted in the visualized portions of the
skeleton.
IMPRESSION: 1. No significant incidental noncardiac findings are noted.

## 2019-02-24 ENCOUNTER — Ambulatory Visit: Payer: Managed Care, Other (non HMO)

## 2019-04-09 ENCOUNTER — Ambulatory Visit: Payer: 59 | Attending: Internal Medicine

## 2019-04-09 DIAGNOSIS — Z23 Encounter for immunization: Secondary | ICD-10-CM | POA: Insufficient documentation

## 2019-04-09 NOTE — Progress Notes (Signed)
   Covid-19 Vaccination Clinic  Name:  [name]    MRN: [id number] DOB: 07-18-1955  04/09/2019  [name] was observed post Covid-19 immunization for 15 minutes without incident. He was provided with Vaccine Information Sheet and instruction to access the V-Safe system.   [name] was instructed to call 911 with any severe reactions post vaccine: Marland Kitchen Difficulty breathing  . Swelling of face and throat  . A fast heartbeat  . A bad rash all over body  . Dizziness and weakness   Immunizations Administered    Name Date Dose VIS Date Route   Pfizer COVID-19 Vaccine 04/09/2019  5:50 PM 0.3 mL 01/13/2019 Intramuscular   Manufacturer: [employer], [employer]   Lot: [id number]   NDC: [address]-0

## 2019-05-02 ENCOUNTER — Ambulatory Visit: Payer: 59 | Attending: Internal Medicine

## 2019-05-02 DIAGNOSIS — Z23 Encounter for immunization: Secondary | ICD-10-CM

## 2019-05-02 NOTE — Progress Notes (Signed)
   Covid-19 Vaccination Clinic  Name:  Eric [name]    MRN: [id number] DOB: 1955/10/06  05/02/2019  Mr. Dortch was observed post Covid-19 immunization for 15 minutes without incident. He was provided with Vaccine Information Sheet and instruction to access the V-Safe system.   Mr. Nill was instructed to call 911 with any severe reactions post vaccine: Marland Kitchen Difficulty breathing  . Swelling of face and throat  . A fast heartbeat  . A bad rash all over body  . Dizziness and weakness   Immunizations Administered    Name Date Dose VIS Date Route   Pfizer COVID-19 Vaccine 05/02/2019  9:51 AM 0.3 mL 01/13/2019 Intramuscular   Manufacturer: [employer], [employer]   Lot: [id number]   NDC: [address]-4

## 2020-08-19 LAB — COMPREHENSIVE METABOLIC PANEL
Albumin: 4 (ref 3.5–5.0)
Calcium: 9.3 (ref 8.7–10.7)

## 2020-08-19 LAB — BASIC METABOLIC PANEL
Chloride: 106 (ref 99–108)
Glucose: 109
Sodium: 139 (ref 137–147)

## 2020-08-19 LAB — HEPATIC FUNCTION PANEL: ALT: 20 U/L (ref 10–40)

## 2021-05-12 ENCOUNTER — Ambulatory Visit: Payer: 59 | Admitting: Family Medicine

## 2021-05-31 ENCOUNTER — Encounter: Payer: Self-pay | Admitting: Family Medicine

## 2021-06-06 ENCOUNTER — Encounter: Payer: Self-pay | Admitting: Family Medicine

## 2021-06-06 ENCOUNTER — Ambulatory Visit (INDEPENDENT_AMBULATORY_CARE_PROVIDER_SITE_OTHER): Payer: Medicare Other | Admitting: Family Medicine

## 2021-06-06 VITALS — BP 128/83 | HR 72 | Temp 98.2°F | Ht 66.0 in | Wt 155.0 lb

## 2021-06-06 DIAGNOSIS — Z8249 Family history of ischemic heart disease and other diseases of the circulatory system: Secondary | ICD-10-CM | POA: Diagnosis not present

## 2021-06-06 DIAGNOSIS — I2510 Atherosclerotic heart disease of native coronary artery without angina pectoris: Secondary | ICD-10-CM | POA: Diagnosis not present

## 2021-06-06 DIAGNOSIS — E785 Hyperlipidemia, unspecified: Secondary | ICD-10-CM | POA: Diagnosis not present

## 2021-06-06 DIAGNOSIS — Z7689 Persons encountering health services in other specified circumstances: Secondary | ICD-10-CM

## 2021-06-06 DIAGNOSIS — R931 Abnormal findings on diagnostic imaging of heart and coronary circulation: Secondary | ICD-10-CM

## 2021-06-06 DIAGNOSIS — R944 Abnormal results of kidney function studies: Secondary | ICD-10-CM | POA: Diagnosis not present

## 2021-06-06 DIAGNOSIS — Z91018 Allergy to other foods: Secondary | ICD-10-CM

## 2021-06-06 MED ORDER — PRAVASTATIN SODIUM 80 MG PO TABS: 80.0000 mg | ORAL_TABLET | Freq: Every day | ORAL | 3 refills | Status: DC

## 2021-06-06 NOTE — Patient Instructions (Signed)
Coronary Artery Disease, Male Coronary artery disease (CAD) is a condition in which the arteries that lead to the heart (coronary arteries) become narrow or blocked. The narrowing or blockage can lead to decreased blood flow to the heart. Prolonged reduced blood flow can cause a heart attack (myocardial infarction, or MI). This condition may also be called coronary heart disease. CAD is the most common type of heart disease, and heart disease is the leading cause of death in men. It is important to understand what causes CAD and how it is treated. What are the causes? CAD is most often caused by atherosclerosis. This is the buildup of fat and cholesterol (plaque) on the inside of the arteries. Over time, the plaque may narrow or block the artery, reducing blood flow to the heart. Plaque can also become weak and break off within a coronary artery and cause a sudden blockage. Other less common causes of CAD include: A blood clot or a piece of another substance that blocks the flow of blood in a coronary artery (embolism). A tearing of the artery (spontaneous coronary artery dissection). An enlargement of an artery (aneurysm). Inflammation (vasculitis) in the artery wall. What increases the risk? The following factors may make you more likely to develop this condition: Age. Men older than 45 years are at a greater risk of CAD. Family history of CAD. High blood pressure (hypertension). Diabetes. High cholesterol levels. Obesity. Other risk factors include: Tobacco use. Excessive alcohol use. Lack of exercise. A diet high in saturated and trans fats, such as fried food and processed meat. What are the signs or symptoms? Many people do not have any symptoms during the early stages of CAD. As the condition progresses, symptoms may include: Chest pain (angina). The pain can: Feel like crushing or squeezing, or like a tightness, pressure, fullness, or heaviness in the chest. Last more than a few  minutes or can stop and recur. The pain tends to get worse with exercise or stress and to fade with rest. Pain in the arms, neck, jaw, ear, or back. Unexplained heartburn or indigestion. Shortness of breath. Nausea or vomiting. Sudden light-headedness. Sudden cold sweats. Fluttering or fast heartbeat (palpitations). How is this diagnosed? This condition is diagnosed based on: Your family and medical history. A physical exam. Tests. These may include: A test to check the electrical signals in your heart (electrocardiogram). Exercise stress test. This looks for signs of blockage when the heart is stressed with exercise, such as running on a treadmill. Pharmacologic stress test. This test looks for signs of blockage when the heart is being stressed with a medicine. Blood tests to check levels of cardiac enzymes such as troponin and creatine kinase. Coronary angiogram. This is a procedure to look at the coronary arteries to see if there is any blockage. During this test, a dye is injected into your arteries so they appear on an X-ray. Coronary artery CT scan. This scan helps detect calcium deposits in your coronary arteries. Calcium deposits are an indicator of CAD. A test that uses sound waves to take a picture of your heart (echocardiogram). How is this treated? This condition may be treated by: Healthy lifestyle changes to reduce risk factors. Medicines such as: Antiplatelet medicines such as clopidogrel or aspirin. These help to prevent blood clots. Nitroglycerin. Blood pressure medicines. Cholesterol-lowering medicine. Coronary angioplasty and stenting. During this procedure, a thin, flexible tube is inserted through a blood vessel and into a blocked artery. A balloon or similar device on the   end of the tube is inflated to open up the artery. In some cases, a small, mesh tube (stent) is inserted into the artery to keep it open. Coronary artery bypass surgery. During this surgery, veins  or arteries from other parts of the body are used to create a bypass around the blockage and allow blood to reach your heart. Follow these instructions at home: Medicines Take over-the-counter and prescription medicines only as told by your health care provider. Do not take the following medicines unless your health care provider approves: NSAIDs, such as ibuprofen, naproxen, or celecoxib. Vitamin supplements that contain vitamin A, vitamin E, or both. Lifestyle Follow an exercise program approved by your health care provider. Ask your health care provider if cardiac rehab is appropriate. Maintain a healthy weight or lose weight as approved by your health care provider. Learn to manage stress or try to limit your stress. Ask your health care provider for suggestions if you need help. Get screened for depression and seek treatment, if needed. Do not use any products that contain nicotine or tobacco. These products include cigarettes, chewing tobacco, and vaping devices, such as e-cigarettes. If you need help quitting, ask your health care provider. Eating and drinking  Follow a heart-healthy diet. A dietitian can help educate you about healthy food options and changes. In general, eat plenty of fruits and vegetables, lean meats, and whole grains. Avoid foods high in: Sugar. Salt (sodium). Saturated fat, such as processed or fatty meat. Trans fat, such as fried foods. Use healthy cooking methods such as roasting, grilling, broiling, baking, poaching, steaming, or stir-frying. Do not drink alcohol if your health care provider tells you not to drink. If you drink alcohol: Limit how much you have to 0-2 drinks a day. Know how much alcohol is in your drink. In the [place] one drink equals one 12 oz bottle of beer (355 mL), one 5 oz glass of wine (148 mL), or one 1 oz glass of hard liquor (44 mL). General instructions Manage any other health conditions, such as high cholesterol, hypertension, and  diabetes. These conditions affect your heart. Your health care provider may ask you to monitor your blood pressure. Keep all follow-up visits. This is important. Get help right away if: You have pain in your chest, neck, ear, arm, jaw, stomach, or back that: Lasts more than a few minutes. Is recurring. Is not relieved by taking medicine under your tongue (sublingual nitroglycerin). You have profuse sweating without cause. You have unexplained: Heartburn or indigestion. Shortness of breath or difficulty breathing. Fluttering or fast heartbeat (palpitations). Nausea or vomiting. Fatigue or weakness. Feelings of nervousness or anxiety. You have sudden light-headedness or dizziness. You faint. These symptoms may be an emergency. Get help right away. Call 911. Do not wait to see if the symptoms will go away. Do not drive yourself to the hospital. Summary Coronary artery disease (CAD) is a condition in which the arteries that lead to the heart (coronary arteries) become narrow or blocked. Prolonged reduced blood flow can cause a heart attack. CAD can be treated with lifestyle changes, medicines, coronary angioplasty or stents, coronary artery bypass surgery, or a combination of these treatments. Keep all follow-up visits. This is important. This information is not intended to replace advice given to you by your health care provider. Make sure you discuss any questions you have with your health care provider. Document Revised: 12/18/2020 Document Reviewed: 12/18/2020 Elsevier Patient Education  2023 Elsevier Inc.  

## 2021-06-06 NOTE — Progress Notes (Signed)
? ? ? ?Patient ID: Eric [name], male  DOB: Apr 04, 1955, 66 y.o.   MRN: [id number] ?Patient Care Team  ?  Relationship Specialty Notifications Start End  ?Ma Hillock, DO PCP - General Family Medicine  06/06/21   ?[facility] Consulting Physician Optometry  06/09/21   ? ? ?Chief Complaint  ?Patient presents with  ? Establish Care  ? Hyperlipidemia  ?  CMC; HLD and CKD  ? ? ?Subjective: ?[name] is a 66 y.o.  male present for new patient establishment. ?All past medical history, surgical history, allergies, family history, immunizations, medications and social history were updated in the electronic medical record today. ?All recent labs, ED visits and hospitalizations within the last year were reviewed. ? ?Patient presents today for new patient establishment.  He has a history of coronary artery disease, hyperlipidemia, family history of premature CAD.  He is compliant with pravastatin 80 mg daily.  He did have an elevated calcium score in 2018 of 147 which is in the 74th percentile for age and sex.  He had 30% calcified plaque in the proximal LAD and 50% calcified plaque in the proximal RCA.  He has not followed with cardiology since that time.  He is extremely physically active and denies any chest pain with activity.  He is wondering if there is something he should be doing to monitor his coronary artery disease more closely. ?2018: ?Coronary Calcium Score & CTA: 1) Calcium score 147 which is 74th percentile for age and sex 2) Right dominant coronary arteries with Less than 30% calcified plaque in proximal LAD. Less than 50% calcified plaque in proximal RCA, Less than 30% soft plauqe in mid RCA and Less than 30% calcified plaque in distal RCA ? ? ?He reports he has noticed increase in asthma-like symptoms, headaches and brain fog.  He feels the headaches and brain fog are correlated with tree nuts and legumes.  He would like tested for food allergies to be certain.  Currently he has been avoiding.  He also  wonders if he may be developing some reactive airway disease such as exercise-induced asthma. ? ? ?  06/06/2021  ?  1:46 PM  ?Depression screen PHQ 2/9  ?Decreased Interest 0  ?Down, Depressed, Hopeless 0  ?PHQ - 2 Score 0  ? ?   ? View : No data to display.  ?  ?  ?  ? ? ? ? ?  ?  ? ?  06/06/2021  ?  1:46 PM  ?Fall Risk   ?Falls in the past year? 0  ?Number falls in past yr: 0  ?Injury with Fall? 0  ?Risk for fall due to : No Fall Risks  ?Follow up Falls evaluation completed  ? ? ? ?Immunization History  ?Administered Date(s) Administered  ? Influenza Split 12/23/2011, 11/28/2018, 11/21/2019  ? Influenza,inj,Quad PF,6+ Mos 01/17/2014, 12/11/2014, 12/11/2015, 12/11/2016, 11/14/2020  ? Moderna Covid-19 Vaccine Bivalent Booster 76yrs & up 10/21/2020  ? Moderna Sars-Covid-2 Vaccination 05/23/2020  ? PFIZER(Purple Top)SARS-COV-2 Vaccination 04/09/2019, 05/02/2019, 11/21/2019, 05/23/2020  ? Pension scheme manager 46yrs & up 10/21/2020  ? Td 05/15/2008  ? ? ?No results found. ? ?Past Medical History:  ?Diagnosis Date  ? Allergies   ? Allergy 1976  ? Chicken pox   ? Coronary artery disease, non-occlusive   ? Elevated Coronary Calcium Score - Non-obstructive CAD (30-50%) on Coronary CTA  ? Dislocation of shoulder, left, closed   ? GERD (gastroesophageal reflux disease)   ?  Gluten intolerance   ? Leads to diffuse GI issues  ? Heart disease   ? Hyperlipemia   ? Kidney disease   ? 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 ?Allergies  ?Allergen Reactions  ? Gluten Meal Diarrhea and Other (See Comments)  ?  GI  ? ?Past Surgical History:  ?Procedure Laterality Date  ? COLONOSCOPY  10 years ago=Normal  ? done in [place]; pt unsure MD name, he will look for records.  ? Coronary CT Angiograpm  July-August 2018  ? 1) Calcium score 147 which is 74th percentile for age and sex 2) Right dominant coronary arteries with Less than 30% calcified plaque in proximal LAD. Less than 50% calcified plaque in proximal RCA, Less than 30% soft plauqe in mid  RCA and Less than 30% calcified plaque in distal RCA  ? [place]  ? TOE SURGERY  2010  ? TONSILLECTOMY  1962  ? VASECTOMY    ? ?Family History  ?Problem Relation Age of Onset  ? Hypertension Mother   ? Breast cancer Mother   ? Hearing loss Mother   ? Hyperlipidemia Father   ? Hypertension Father   ? Dementia Father   ? Heart disease Father   ? Prostate cancer Father   ? Heart attack Father   ?     Paternal uncle also had MI  ? Depression Father   ? Obesity Father   ? Alcohol abuse Sister   ? Hypertension Brother   ? Alcohol abuse Brother   ? Coronary artery disease Paternal Uncle   ? Heart attack Paternal Uncle   ? Heart attack Paternal Uncle   ? Coronary artery disease Paternal Uncle   ? Heart disease Paternal Grandfather   ? Colon cancer Neg Hx   ? Colon polyps Neg Hx   ? Pancreatic cancer Neg Hx   ? Rectal cancer Neg Hx   ? Stomach cancer Neg Hx   ? ?Social History  ? ?Social History Narrative  ? He is been married for 37 years.   ? [employer] consultant VP for [employer]  ? He is an avid exercise enthusiast - runs, bicycling and swimming at least 5 days a week for 40 minutes in time.  ? Has smoke alarm in the home  ? Wears a seatbelt.  ? Feels safe in his relationship.  ? ? ?Allergies as of 06/06/2021   ? ?   Reactions  ? Gluten Meal Diarrhea, Other (See Comments)  ? GI  ? ?  ? ?  ?Medication List  ?  ? ?  ? Accurate as of Jun 06, 2021 11:59 PM. If you have any questions, ask your nurse or doctor.  ?  ?  ? ?  ? ?STOP taking these medications   ? ?aspirin 81 MG tablet ?Stopped by: Howard Pouch, DO ?  ? ?  ? ?TAKE these medications   ? ?nitroGLYCERIN 0.2 mg/hr patch ?Commonly known as: NITRODUR - Dosed in mg/24 hr ?SMARTSIG:0.25-0.5 Patch(s) Topical Daily ?  ?Omeprazole 20 MG Tbec ?Take by mouth. ?  ?pravastatin 80 MG tablet ?Commonly known as: PRAVACHOL ?Take 1 tablet (80 mg total) by mouth daily. ?  ? ?  ? ? ?All past medical history, surgical history, allergies, family history, immunizations  andmedications were updated in the EMR today and reviewed under the history and medication portions of their EMR.    ?Recent Results (from the past 2160 hour(s))  ?Basic Metabolic Panel (BMET)  Status: Abnormal  ? Collection Time: 06/06/21  2:36 PM  ?Result Value Ref Range  ? Glucose, Bld 69 65 - 99 mg/dL  ?  Comment: . ?           Fasting reference interval ?. ?  ? BUN 28 (H) 7 - 25 mg/dL  ? Creat 1.31 0.70 - 1.35 mg/dL  ? BUN/Creatinine Ratio 21 6 - 22 (calc)  ? Sodium 139 135 - 146 mmol/L  ? Potassium 4.5 3.5 - 5.3 mmol/L  ? Chloride 104 98 - 110 mmol/L  ? CO2 22 20 - 32 mmol/L  ? Calcium 9.4 8.6 - 10.3 mg/dL  ? ? ?DG Knee Complete 4 Views Right ? ?Result Date: 06/02/2017 ?CLINICAL DATA:  Chronic knee pain. EXAM: RIGHT KNEE - COMPLETE 4+ VIEW COMPARISON:  None. FINDINGS: No fracture of the proximal tibia or distal femur. Patella is normal. No joint effusion. No significant arthropathy. IMPRESSION: No acute osseous abnormality. No significant arthropathy identified. No effusion. Electronically Signed   By: Suzy Bouchard M.D.   On: 06/02/2017 08:20  ? ? ? ?ROS ?14 pt review of systems performed and negative (unless mentioned in an HPI) ? ?Objective: ?BP 128/83   Pulse 72   Temp 98.2 ?F (36.8 ?C) (Oral)   Ht 5\' 6"  (1.676 m)   Wt 155 lb (70.3 kg)   SpO2 97%   BMI 25.02 kg/m?  ?Physical Exam ?Vitals and nursing note reviewed.  ?Constitutional:   ?   General: He is not in acute distress. ?   Appearance: Normal appearance. He is not ill-appearing, toxic-appearing or diaphoretic.  ?HENT:  ?   Head: Normocephalic and atraumatic.  ?Eyes:  ?   General: No scleral icterus.    ?   Right eye: No discharge.     ?   Left eye: No discharge.  ?   Extraocular Movements: Extraocular movements intact.  ?   Pupils: Pupils are equal, round, and reactive to light.  ?Cardiovascular:  ?   Rate and Rhythm: Normal rate and regular rhythm.  ?   Heart sounds: No murmur heard. ?Pulmonary:  ?   Effort: Pulmonary effort is normal. No  respiratory distress.  ?   Breath sounds: Normal breath sounds. No wheezing, rhonchi or rales.  ?Musculoskeletal:  ?   Cervical back: Neck supple.  ?   Right lower leg: No edema.  ?   Left lower leg: No edema.  ?Lymp

## 2021-06-07 LAB — BASIC METABOLIC PANEL
BUN/Creatinine Ratio: 21 (calc) (ref 6–22)
BUN: 28 mg/dL — ABNORMAL HIGH (ref 7–25)
CO2: 22 mmol/L (ref 20–32)
Calcium: 9.4 mg/dL (ref 8.6–10.3)
Chloride: 104 mmol/L (ref 98–110)
Creat: 1.31 mg/dL (ref 0.70–1.35)
Glucose, Bld: 69 mg/dL (ref 65–99)
Potassium: 4.5 mmol/L (ref 3.5–5.3)
Sodium: 139 mmol/L (ref 135–146)

## 2021-06-09 ENCOUNTER — Encounter: Payer: Self-pay | Admitting: Family Medicine

## 2021-06-09 DIAGNOSIS — G9332 Myalgic encephalomyelitis/chronic fatigue syndrome: Secondary | ICD-10-CM | POA: Insufficient documentation

## 2021-06-09 DIAGNOSIS — N529 Male erectile dysfunction, unspecified: Secondary | ICD-10-CM | POA: Insufficient documentation

## 2021-06-09 DIAGNOSIS — R931 Abnormal findings on diagnostic imaging of heart and coronary circulation: Secondary | ICD-10-CM | POA: Insufficient documentation

## 2021-06-09 DIAGNOSIS — E538 Deficiency of other specified B group vitamins: Secondary | ICD-10-CM | POA: Insufficient documentation

## 2021-06-09 DIAGNOSIS — R7303 Prediabetes: Secondary | ICD-10-CM | POA: Insufficient documentation

## 2021-07-22 ENCOUNTER — Ambulatory Visit (INDEPENDENT_AMBULATORY_CARE_PROVIDER_SITE_OTHER): Payer: Medicare Other | Admitting: Allergy

## 2021-07-22 ENCOUNTER — Encounter: Payer: Self-pay | Admitting: Allergy

## 2021-07-22 VITALS — BP 140/86 | HR 55 | Temp 98.0°F | Resp 20 | Ht 65.25 in | Wt 155.2 lb

## 2021-07-22 DIAGNOSIS — Z713 Dietary counseling and surveillance: Secondary | ICD-10-CM

## 2021-07-22 DIAGNOSIS — J45990 Exercise induced bronchospasm: Secondary | ICD-10-CM

## 2021-07-22 DIAGNOSIS — J310 Chronic rhinitis: Secondary | ICD-10-CM | POA: Diagnosis not present

## 2021-07-22 HISTORY — DX: Dietary counseling and surveillance: Z71.3

## 2021-07-22 HISTORY — DX: Chronic rhinitis: J31.0

## 2021-07-22 MED ORDER — ALBUTEROL SULFATE HFA 108 (90 BASE) MCG/ACT IN AERS: 2.0000 | INHALATION_SPRAY | RESPIRATORY_TRACT | 1 refills | Status: DC | PRN

## 2021-07-22 NOTE — Assessment & Plan Note (Addendum)
Noted shortness of breath with swimming and first mile running. Sometimes has coughing and wheezing at the end. Runs 6 miles consistently. Denies any prior asthma diagnosis or inhaler use.  Today's spirometry was normal.  Given clinical history there is some concern for EIB.  I doubt this has anything to do with autoimmune issues. Discussed that if he is concerned for autoimmune diseases then recommend rheumatology evaluation next. . Prior to physical activity: May use albuterol rescue inhaler 2 puffs 5 to 15 minutes prior to strenuous physical activities. Demonstrated proper use. o If you notice any side effects from it let us know. o You may take an additional 2 puffs at the end of your exercise if you have coughing, wheezing.

## 2021-07-22 NOTE — Assessment & Plan Note (Addendum)
Avoiding gluten due to gluten intolerance - improved eczema and GI symptoms with avoidance. Noted brain fog and headaches with legumes/tree nut ingestion.   Discussed with patient that skin prick testing and bloodwork (food IgE levels) check for IgE mediated reactions which his clinical presentation does not support.   No indication for any skin testing to foods today.   Keep a food journal with symptoms and foods eaten.  Recommend to reintroduce foods one by one a few days apart and see if he develops any symptoms.  If no issues then okay to consume.  If has issues then continue to avoid.

## 2021-07-22 NOTE — Progress Notes (Signed)
New Patient Note  RE: Eric [name] MRN: [id number] DOB: [birth year] Date of Office Visit: 07/22/2021  Consult requested by: Natalia Leatherwood, DO Primary care provider: Natalia Leatherwood, DO  Chief Complaint: Allergy Testing (Is worried about food allergies. Legumes, gluten, tree nuts, ), Allergic Rhinitis  (Has seasonal allergies), and Food Intolerance (Legumes bother him, Is not sure if he still has an intolerance to gluten. He had been diagnosed with the gluten intolerance years ago. Some lactose intolerance. )  History of Present Illness: I had the pleasure of seeing Eric [name] for initial evaluation at the [facility] on 07/22/2021. He is a 66 y.o. male, who is referred here by Felix Pacini A, DO for the evaluation of food allergy.  Patient was diagnosed with gluten intolerance 15 years ago. He had negative celiac bloodwork at that time but had some other work up done and was told to stop eating gluten.  After he stopped eating gluten his eczema resolved and his GI symptoms improved significantly.  Lately noticed brain fog and headaches and he thinks it's caused by legumes and tree nuts. Symptoms resolve after 1 day with Advil and Tylenol.  Denies any other associated symptoms.  This started about 10 years ago but didn't stop eliminating legumes and tree nuts from his diet until 5 years ago.   Now questioning whether he needs to avoid all these foods and if there are other things he can avoid to improve his health - mainly concerning his breathing.  He had questions about autoimmune diseases and if that could play a role in his current breathing issues.   Past work up includes: none. Dietary History: patient has been eating other foods including milk, eggs, limited sesame, shellfish, fish, soy, meats, fruits and vegetables. Avoiding peanuts, legumes, tree nuts, gluten  06/06/2021 PCP visit: "He reports he has noticed increase in asthma-like symptoms, headaches and  brain fog.  He feels the headaches and brain fog are correlated with tree nuts and legumes.  He would like tested for food allergies to be certain.  Currently he has been avoiding.  He also wonders if he may be developing some reactive airway disease such as exercise-induced asthma."  Assessment and Plan: Keiandre is a 66 y.o. male with: Dietary counseling and surveillance Avoiding gluten due to gluten intolerance - improved eczema and GI symptoms with avoidance. Noted brain fog and headaches with legumes/tree nut ingestion.  Discussed with patient that skin prick testing and bloodwork (food IgE levels) check for IgE mediated reactions which his clinical presentation does not support.  No indication for any skin testing to foods today.  Keep a food journal with symptoms and foods eaten. Recommend to reintroduce foods one by one a few days apart and see if he develops any symptoms. If no issues then okay to consume. If has issues then continue to avoid.   Exercise-induced asthma Noted shortness of breath with swimming and first mile running. Sometimes has coughing and wheezing at the end. Runs 6 miles consistently. Denies any prior asthma diagnosis or inhaler use. Today's spirometry was normal. Given clinical history there is some concern for EIB. I doubt this has anything to do with autoimmune issues. Discussed that if he is concerned for autoimmune diseases then recommend rheumatology evaluation next. Prior to physical activity: May use albuterol rescue inhaler 2 puffs 5 to 15 minutes prior to strenuous physical activities. Demonstrated proper use. If you notice any side effects from it let  us know. You may take an additional 2 puffs at the end of your exercise if you have coughing, wheezing.   Chronic rhinitis Some mild symptoms in the spring and fall. Tried zyrtec with no benefit. Skin testing in 2006 was positive to mold per patient. No prior AIT. Today's skin prick testing was negative to  indoor/outdoor allergens. Monitor symptoms. Use over the counter antihistamines such as Zyrtec (cetirizine), Claritin (loratadine), Allegra (fexofenadine), or Xyzal (levocetirizine) daily as needed. May switch antihistamines every few months. Use Flonase (fluticasone) nasal spray 1 spray per nostril twice a day as needed for nasal congestion.   Return in about 3 months (around 10/22/2021).  Meds ordered this encounter  Medications   albuterol (VENTOLIN HFA) 108 (90 Base) MCG/ACT inhaler    Sig: Inhale 2 puffs into the lungs every 4 (four) hours as needed for wheezing or shortness of breath (coughing fits).    Dispense:  18 g    Refill:  1   Lab Orders  No laboratory test(s) ordered today    Other allergy screening: Asthma:  Patient is concerned about possible exercise induced asthma. He noted issues with his swimming in a triathlon with difficulty breathing. He also noted shortness of breath in his first mile of running. Randomly noted coughing and wheezing at the end of his exercise. No prior inhaler use.  Rhino conjunctivitis: yes Sneezing, watery/itchy eyes mainly in the spring and fall. He tried zyrtec in the past and symptoms seem to be better when not taking it.  Skin testing in 2006 was positive to mold in the past per patient report. No prior AIT.   Medication allergy: no Hymenoptera allergy: no Urticaria: no Eczema:no History of recurrent infections suggestive of immunodeficency: no  Diagnostics: Spirometry:  Tracings reviewed. His effort: Good reproducible efforts. FVC: 3.94L FEV1: 2.93L, 104% predicted FEV1/FVC ratio: 74% Interpretation: Spirometry consistent with normal pattern.  Please see scanned spirometry results for details.  Skin Testing: Environmental allergy panel. Negative to indoor/outdoor allergens. Results discussed with patient/family.  Airborne Adult Perc - 07/22/21 0958     Time Antigen Placed [id number]    Allergen Manufacturer Waynette ButteryGreer     Location Back    Number of Test 59    1. Control-Buffer 50% Glycerol Negative    2. Control-Histamine 1 mg/ml 2+    3. Albumin saline Negative    4. Bahia Negative    5. French Southern TerritoriesBermuda Negative    6. Johnson Negative    7. Kentucky Blue Negative    8. Meadow Fescue Negative    9. Perennial Rye Negative    10. Sweet Vernal Negative    11. Timothy Negative    12. Cocklebur Negative    13. Burweed Marshelder Negative    14. Ragweed, short Negative    15. Ragweed, Giant Negative    16. Plantain,  English Negative    17. Lamb's Quarters Negative    18. Sheep Sorrell Negative    19. Rough Pigweed Negative    20. Marsh Elder, Rough Negative    21. Mugwort, Common Negative    22. Ash mix Negative    23. Birch mix Negative    24. Beech American Negative    25. Box, Elder Negative    26. Cedar, red Negative    27. Cottonwood, Guinea-BissauEastern Negative    28. Elm mix Negative    29. Hickory Negative    30. Maple mix Negative    31. Oak, Guinea-BissauEastern mix Negative    32.  Pecan Pollen Negative    33. Pine mix Negative    34. Sycamore Eastern Negative    35. Walnut, Black Pollen Negative    36. Alternaria alternata Negative    37. Cladosporium Herbarum Negative    38. Aspergillus mix Negative    39. Penicillium mix Negative    40. Bipolaris sorokiniana (Helminthosporium) Negative    41. Drechslera spicifera (Curvularia) Negative    42. Mucor plumbeus Negative    43. Fusarium moniliforme Negative    44. Aureobasidium pullulans (pullulara) Negative    45. Rhizopus oryzae Negative    46. Botrytis cinera Negative    47. Epicoccum nigrum Negative    48. Phoma betae Negative    49. Candida Albicans Negative    50. Trichophyton mentagrophytes Negative    51. Mite, D Farinae  5,000 AU/ml Negative    52. Mite, D Pteronyssinus  5,000 AU/ml Negative    53. Cat Hair 10,000 BAU/ml Negative    54.  Dog Epithelia Negative    55. Mixed Feathers Negative    56. Horse Epithelia Negative    57. Cockroach, German  Negative    58. Mouse Negative    59. 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   Chronic fatigue syndrome 06/09/2021   B12 deficiency 06/09/2021   Agatston coronary artery calcium score between 100 and 199 06/09/2021   Coronary artery disease, non-occlusive    Family history of premature CAD 07/23/2016   Hyperlipidemia with target LDL less than 100 07/23/2016   Ganglion cyst 09/17/2010   Seasonal allergies 04/15/2010   Gluten intolerance 04/15/2010   GERD (gastroesophageal reflux disease) 04/15/2010   Past Medical History:  Diagnosis Date   Allergies    Allergy 1976   Chicken pox    Coronary artery disease, non-occlusive    Elevated Coronary Calcium Score - Non-obstructive CAD (30-50%) on Coronary CTA   Dislocation of shoulder, left, closed    Eczema    GERD (gastroesophageal reflux disease)    Gluten intolerance    Leads to diffuse GI issues   Heart disease    Hyperlipemia    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   Coronary CT Angiograpm  July-August 2018   1) Calcium score 147 which is 74th percentile for age and sex 2) Right dominant coronary arteries with Less than 30% calcified plaque in proximal LAD. Less than 50% calcified plaque in proximal RCA, Less than 30% soft plauqe in mid RCA and Less than 30% calcified plaque in distal RCA   MOUTH SURGERY  1965   TOE SURGERY  2010   TONSILLECTOMY  1962   VASECTOMY     Medication List:  Current Outpatient Medications  Medication Sig Dispense Refill   albuterol (VENTOLIN HFA) 108 (90  Base) MCG/ACT inhaler Inhale 2 puffs into the lungs every 4 (four) hours as needed for wheezing or shortness of breath (coughing fits). 18 g 1   ASPIRIN 81 PO Take 1 tablet by mouth daily as needed.  COD LIVER OIL PO Take by mouth.     nitroGLYCERIN (NITRODUR - DOSED IN MG/24 HR) 0.2 mg/hr patch SMARTSIG:0.25-0.5 Patch(s) Topical Daily     Omeprazole 20 MG TBEC Take by mouth.     pravastatin (PRAVACHOL) 80 MG tablet Take 1 tablet (80 mg total) by mouth daily. 90 tablet 3   TH MAGNESIUM PO Take by mouth.     No current facility-administered medications for this visit.   Allergies: Allergies  Allergen Reactions   Gluten Meal Diarrhea and Other (See Comments)    GI   Social History: Social History   Socioeconomic History   Marital status: Married    Spouse name: Not on file   Number of children: 4   Years of education: Not on file   Highest education level: Not on file  Occupational History   Occupation: Production designer, theatre/television/film    Comment: Health and safety inspector  Tobacco Use   Smoking status: Former    Packs/day: 1.00    Years: 2.00    Total pack years: 2.00    Types: Cigarettes    Quit date: 07/04/1971    Years since quitting: 50.0    Passive exposure: Never   Smokeless tobacco: Never  Vaping Use   Vaping Use: Never used  Substance and Sexual Activity   Alcohol use: Yes    Alcohol/week: 7.0 standard drinks of alcohol    Types: 7 Glasses of wine per week   Drug use: No   Sexual activity: Not Currently    Partners: Female    Birth control/protection: Surgical    Comment: vasectomy  Other Topics Concern   Not on file  Social History Narrative   He is been married for 37 years.    Masters degree executive VP for [employer]   He is an avid exercise enthusiast - runs, bicycling and swimming at least 5 days a week for 40 minutes in time.   Has smoke alarm in the home   Wears a seatbelt.   Feels safe in his relationship.   Social Determinants of Health   Financial Resource Strain: Not  on file  Food Insecurity: Not on file  Transportation Needs: Not on file  Physical Activity: Not on file  Stress: Not on file  Social Connections: Not on file   Lives in a house built in 2003. Smoking: denies Occupation: retired  Landscape architect HistorySurveyor, minerals in the house: no Engineer, civil (consulting) in the family room: yes Carpet in the bedroom: yes Heating: gas Cooling: central Pet: yes 1 cat  x 15 years  Family History: Family History  Problem Relation Age of Onset   Hypertension Mother    Breast cancer Mother    Hearing loss Mother    Hyperlipidemia Father    Hypertension Father    Dementia Father    Heart disease Father    Prostate cancer Father    Heart attack Father        Paternal uncle also had MI   Depression Father    Obesity Father    Alcohol abuse Sister    Allergic rhinitis Brother    Hypertension Brother    Alcohol abuse Brother    Coronary artery disease Paternal Uncle    Heart attack Paternal Uncle    Heart attack Paternal Uncle    Coronary artery disease Paternal Uncle    Heart disease Paternal Grandfather    Angioedema Other    Eczema Other    Colon cancer Neg Hx    Colon  polyps Neg Hx    Pancreatic cancer Neg Hx    Rectal cancer Neg Hx    Stomach cancer Neg Hx    Review of Systems  Constitutional:  Negative for appetite change, chills, fever and unexpected weight change.  HENT:  Negative for congestion and rhinorrhea.   Eyes:  Negative for itching.  Respiratory:  Positive for shortness of breath. Negative for cough, chest tightness and wheezing.        Only with exercise.   Cardiovascular:  Negative for chest pain.  Gastrointestinal:  Negative for abdominal pain.  Genitourinary:  Negative for difficulty urinating.  Skin:  Negative for rash.  Allergic/Immunologic: Negative for environmental allergies.  Neurological:  Negative for headaches.    Objective: BP 140/86   Pulse (!) 55   Temp 98 F (36.7 C)   Resp 20   Ht 5' 5.25" (1.657  m)   Wt 155 lb 4 oz (70.4 kg)   SpO2 100%   BMI 25.64 kg/m  Body mass index is 25.64 kg/m. Physical Exam Vitals and nursing note reviewed.  Constitutional:      Appearance: Normal appearance. He is well-developed.  HENT:     Head: Normocephalic and atraumatic.     Right Ear: Tympanic membrane and external ear normal.     Left Ear: Tympanic membrane and external ear normal.     Nose: Nose normal.     Mouth/Throat:     Mouth: Mucous membranes are moist.     Pharynx: Oropharynx is clear.  Eyes:     Conjunctiva/sclera: Conjunctivae normal.  Cardiovascular:     Rate and Rhythm: Normal rate and regular rhythm.     Heart sounds: Normal heart sounds. No murmur heard.    No friction rub. No gallop.  Pulmonary:     Effort: Pulmonary effort is normal.     Breath sounds: Normal breath sounds. No wheezing, rhonchi or rales.  Musculoskeletal:     Cervical back: Neck supple.  Skin:    General: Skin is warm.     Findings: No rash.  Neurological:     Mental Status: He is alert and oriented to person, place, and time.  Psychiatric:        Behavior: Behavior normal.    The plan was reviewed with the patient/family, and all questions/concerned were addressed.  It was my pleasure to see Eric [name] today and participate in his care. Please feel free to contact me with any questions or concerns.  Sincerely,  Wyline Mood, DO Allergy & Immunology  [facility] office: [phone] [facility] office: [phone]

## 2021-07-22 NOTE — Patient Instructions (Addendum)
Today's skin testing showed: Negative to indoor/outdoor allergens.   Results given.  Food Discussed with patient that skin prick testing and bloodwork (food IgE levels) check for IgE mediated reactions which his clinical presentation does not support.  Keep a food journal with symptoms and foods eaten. Recommend to reintroduce foods one by one a few days apart and see if you notice any symptoms. If no issues then okay to consume. If you notice issues then continue to avoid.   Breathing Normal breathing test today. Prior to physical activity: May use albuterol rescue inhaler 2 puffs 5 to 15 minutes prior to strenuous physical activities. If you notice any side effects from it let us know. You may take an additional 2 puffs at the end of your exercise if you have coughing, wheezing.   Rhinitis Monitor symptoms. Use over the counter antihistamines such as Zyrtec (cetirizine), Claritin (loratadine), Allegra (fexofenadine), or Xyzal (levocetirizine) daily as needed. May switch antihistamines every few months. Use Flonase (fluticasone) nasal spray 1 spray per nostril twice a day as needed for nasal congestion.    Follow up in 3 months or sooner if needed.

## 2021-07-22 NOTE — Assessment & Plan Note (Signed)
Some mild symptoms in the spring and fall. Tried zyrtec with no benefit. Skin testing in 2006 was positive to mold per patient. No prior AIT. Marland Kitchen Today's skin prick testing was negative to indoor/outdoor allergens. . Monitor symptoms. . Use over the counter antihistamines such as Zyrtec (cetirizine), Claritin (loratadine), Allegra (fexofenadine), or Xyzal (levocetirizine) daily as needed. May switch antihistamines every few months. Use Flonase (fluticasone) nasal spray 1 spray per nostril twice a day as needed for nasal congestion.

## 2021-07-28 ENCOUNTER — Ambulatory Visit
Admission: RE | Admit: 2021-07-28 | Discharge: 2021-07-28 | Disposition: A | Discharge status: Home / Self Care | Payer: Self-pay | Source: Ambulatory Visit | Attending: Family Medicine | Admitting: Family Medicine

## 2021-07-28 ENCOUNTER — Telehealth: Payer: Self-pay | Admitting: Family Medicine

## 2021-07-28 ENCOUNTER — Encounter: Payer: Self-pay | Admitting: Family Medicine

## 2021-07-28 DIAGNOSIS — I77810 Thoracic aortic ectasia: Secondary | ICD-10-CM

## 2021-07-28 DIAGNOSIS — E785 Hyperlipidemia, unspecified: Secondary | ICD-10-CM

## 2021-07-28 DIAGNOSIS — I2510 Atherosclerotic heart disease of native coronary artery without angina pectoris: Secondary | ICD-10-CM

## 2021-07-28 DIAGNOSIS — R931 Abnormal findings on diagnostic imaging of heart and coronary circulation: Secondary | ICD-10-CM

## 2021-07-28 DIAGNOSIS — Z8249 Family history of ischemic heart disease and other diseases of the circulatory system: Secondary | ICD-10-CM

## 2021-07-28 NOTE — Telephone Encounter (Signed)
Spoke with pt regarding labs and instructions.   

## 2021-08-31 NOTE — Progress Notes (Unsigned)
Primary Care Provider: Natalia Leatherwood, DO [facility] Cardiologist: None Electrophysiologist: None  Clinic Note: Chief Complaint  Patient presents with   New Patient (Initial Visit)    Reevaluation after repeat Coronary Calcium Score is slightly worse.   Coronary Artery Disease    Mild to moderate diffuse disease noted on coronary CTA in 2018.  No active chest pain.  Patient exercising routinely.    ===================================  ASSESSMENT/PLAN   Problem List Items Addressed This Visit       Cardiology Problems   Hyperlipidemia with target LDL less than 70 (Chronic)    With Coronary Calcium Score 170 target LDL is less than 100.  But now probably should have an LDL less than 70 since there has potentially been a progression of disease (if only calcification).  He is currently taking pravastatin and has not had lipids checked in over a year.  Should be due for lipid check soon with PCP.  Would recommend converting to rosuvastatin at 20 mg daily if slightly increased, if well out of goal, we would switch to rosuvastatin 40 mg daily, targeting LDL less than 70.      Agatston coronary artery calcium score 276; 73% - Primary (Chronic)    Not surprisingly he had a modest increase in his Coronary Calcium Score from previous score of 147-276.  At this level, we do want target LDL less than 70 and he is due to have labs checked soon by PCP.  The fact that he is active at and exercising/running and bicycling as vigorously as he is doing, with suggestive he has not had obstructive CAD and probably would not benefit from a stress test.  If he were to have any reduction in exercise tolerance or worsening exertional dyspnea then we could consider a relook Coronary CTA in the future.   For the most part, we discussed the process of atherosclerosis and how coronary calcium is involved.  It is not unusual at the Coronary Calcium Score will go up unless the lipids been aggressively  controlled.  In fact this could simply mean stabilization of existing plaque as opposed to progression of plaque.  We will follow-up with him every couple years.  Sooner if symptoms warrant.  He is currently only on pravastatin 80 mg.  May need to be more aggressive with that. Also elevated blood pressure ensure that he is maintaining stable pressures currently 134/90 is borderline A1c has been fairly normal, continue to monitor for glycemic control. Per Coronary Calcium Score is a less than 300, the general consensus among his cardiologist is that aspirin may not be warranted.  However, if lipids are not adequately controlled would agree with initiating 81 mg aspirin if only every other day.      Relevant Orders   EKG 12-Lead (Completed)   Ascending aorta dilatation (HCC) (Chronic)    Minimally dilated thoracic aorta.  Also noted segmental dilation of the abdominal aorta.  He has abdominal aortic ultrasound ordered.  We can reassess thoracic aorta in about 2 years with a new CT scan (ultrasound does not work in the chest)        Other   Family history of premature CAD (Chronic)    He is relatively prominent family history for CAD and therefore being a little more aggressive with management is warranted.  As such, would be agreeable for initiating aspirin and LDL is greater than 100.  Targeting LDL of less than 70, A1c less than 5.8, BP less than 135/80.  Relevant Orders   EKG 12-Lead (Completed)   Exercise-induced asthma    He says it actually seems to doing better with using the rescue inhaler.  He thinks it may actually be related to allergies making it worse.      Prediabetes (Chronic)    Follow glycemic control closely.  Low threshold to consider initiation with cardioprotective medications such as SGLT2 inhibitor (plus or minus GLP-1 agonist).  Also metformin would be reasonable to initiate early on.      Relevant Orders   EKG 12-Lead (Completed)     ===================================  HPI:    Eric [name] is a very active & "healthy" 66 y.o. male with HLD pre-DM & Fam Hx of Premature CAD & Cor Ca Score 187 (from 2018 - Cor CTA pLAD 30% & pRCA 50%) is being seen today for Cardiology Reassessment for Elevated Calcium Score at the request of Kuneff, Renee A, DO.  Eric [name] was last seen on 09/09/2016 for follow-up after Coronary CTA -> he is overall doing very well.  Continue with weight loss since.  Total cholesterol is 245 -->  making lifestyle changes.  He has changed his diet, stopped alcohol.  Doing his best to avoid eating out and drinking alcohol in social gatherings.  Has lost 10 pounds.  Currently symptomatic from cardiac standpoint. Recommended target LDL of <100, and preferably <70  Recent Hospitalizations: none  Eric [name] was just seen by Dr. Claiborne Billings on 06/06/2021 to establish a new PCP.  Notes that he is very active & denies CP with or without activity.  Asked about f/u from 2018 testing (reviewed in PMH/PSH. Notes increase in "asthma-like Sx" - ? Concern for Exercise-induced Asthma. Compliant with pravastatin. ASA 81 mg started. -> & Cor Ca Score ordered along with f/u Cardiology c/s.  => He was continued on pravastatin 80 mg daily and told to start aspirin.  Was also given diagnosis of CKD 3  Reviewed  CV studies:    The following studies were reviewed today: (if available, images/films reviewed: From Epic Chart or Care Everywhere) Coronary Calcium Score 07/28/21: Total 276., LAD 89, R A 187 - 73%.  Aorta "mildly dilated "38.5 mm)-for full size man, this is relatively normal.  Coronary Calcium Score & CTA 09/03/16: 1) Calcium score 147 which is 74th percentile for age and sex 2) Right dominant coronary arteries with Less than 30% calcified plaque in proximal LAD. Less than 50% calcified plaque in proximal RCA, Less than 30% soft plauqe in mid RCA and Less than 30% calcified plaque in distal RCA  Interval History:    Eric [name] presents here for cardiology evaluation.  He has been evaluated for symptoms concerning for possible exercise-induced asthma also experiencing some headaches and "brain fog".  He was also concerned of possible food allergy that could be leading to his brain fog. Baer is extremely active and denies any symptoms of chest tightness or pressure at rest or exertion.  With absolutely we will start getting short of breath we will delay starting to wheeze and have difficulty breathing at peak exercise that relieves with rest.  Is exercise program includes running pretty aggressively for at least an hour (up to 6 miles a day) sometimes as well as bicycling.  CV Review of Symptoms (Summary) Cardiovascular ROS: positive for - dyspnea on exertion, shortness of breath, and associated with wheezing, but with no chest tightness negative for - chest pain, edema, irregular heartbeat, orthopnea, palpitations, paroxysmal nocturnal dyspnea, rapid  heart rate, or lightheadedness, dizziness or wooziness, syncope/near syncope or TIA/amaurosis fugax, claudication  REVIEWED OF SYSTEMS   Review of Systems  Constitutional:  Negative for malaise/fatigue.  HENT:  Negative for congestion and nosebleeds.   Respiratory:  Positive for shortness of breath and wheezing.        Per HPI  Gastrointestinal:  Negative for blood in stool and melena.  Genitourinary:  Negative for hematuria.  Musculoskeletal:  Negative for myalgias.  Neurological:  Negative for dizziness and focal weakness.  All other systems reviewed and are negative.   I have reviewed and (if needed) personally updated the patient's problem list, medications, allergies, past medical and surgical history, social and family history.   PAST MEDICAL HISTORY   Past Medical History:  Diagnosis Date   Allergies    Allergy 1976   Chicken pox    Chronic rhinitis 07/22/2021   Coronary artery disease, non-occlusive    Elevated Coronary Calcium Score  - Non-obstructive CAD (30-50%) on Coronary CTA   Dislocation of shoulder, left, closed    Eczema    Ganglion cyst 09/17/2010   GERD (gastroesophageal reflux disease)    Gluten intolerance    Leads to diffuse GI issues   Heart disease    Hyperlipidemia with target LDL less than 70 07/23/2016   Kidney disease    borderline CKD 2/3    PAST SURGICAL HISTORY   Past Surgical History:  Procedure Laterality Date   COLONOSCOPY  10 years ago=[place]   [place]; [place] unsure MD name, he will look for records.   Coronary CT Angiograpm  July-August 2018   1) Calcium score 147 which is 74th percentile for age and sex 2) Right dominant coronary arteries with Less than 30% calcified plaque in proximal LAD. Less than 50% calcified plaque in proximal RCA, Less than 30% soft plauqe in mid RCA and Less than 30% calcified plaque in distal RCA   MOUTH SURGERY  1965   TOE SURGERY  2010   TONSILLECTOMY  1962   VASECTOMY      Immunization History  Administered Date(s) Administered   Influenza Split 12/23/2011, 11/28/2018, 11/21/2019   Influenza,inj,Quad PF,6+ Mos 01/17/2014, 12/11/2014, 12/11/2015, 12/11/2016, 11/14/2020   Moderna Covid-19 Vaccine Bivalent Booster 4345yrs & up 10/21/2020   Moderna Sars-Covid-2 Vaccination 05/23/2020   PFIZER(Purple Top)SARS-COV-2 Vaccination 04/09/2019, 05/02/2019, 11/21/2019, 05/23/2020   PNEUMOCOCCAL CONJUGATE-20 09/09/2021   Pfizer Covid-19 Vaccine Bivalent Booster 4342yrs & up 10/21/2020   Td 05/15/2008    MEDICATIONS/ALLERGIES   Current Meds  Medication Sig   albuterol (VENTOLIN HFA) 108 (90 Base) MCG/ACT inhaler Inhale 2 puffs into the lungs every 4 (four) hours as needed for wheezing or shortness of breath (coughing fits).   ASPIRIN 81 PO Take 1 tablet by mouth daily as needed.   COD LIVER OIL PO Take by mouth.   Omeprazole 20 MG TBEC Take 40 mg by mouth.   TH MAGNESIUM PO Take by mouth.   [DISCONTINUED] pravastatin (PRAVACHOL) 80 MG tablet Take 1 tablet (80  mg total) by mouth daily.    Allergies  Allergen Reactions   Gluten Meal Diarrhea and Other (See Comments)    GI    SOCIAL HISTORY/FAMILY HISTORY   Reviewed in Epic:   Social History   Tobacco Use   Smoking status: Former    Packs/day: 1.00    Years: 2.00    Total pack years: 2.00    Types: Cigarettes    Quit date: 07/04/1971    Years  since quitting: 50.2    Passive exposure: Never   Smokeless tobacco: Never  Vaping Use   Vaping Use: Never used  Substance Use Topics   Alcohol use: Yes    Alcohol/week: 7.0 standard drinks of alcohol    Types: 7 Glasses of wine per week   Drug use: No   Social History   Social History Narrative   He is been married for 37 years.    Masters degree executive VP for [employer]   He is an avid exercise enthusiast - runs, bicycling and swimming at least 5 days a week for 40 minutes in time.   Has smoke alarm in the home   Wears a seatbelt.   Feels safe in his relationship.   Family History  Problem Relation Age of Onset   Hypertension Mother    Breast cancer Mother    Hearing loss Mother    Hyperlipidemia Father    Hypertension Father    Dementia Father    Heart disease Father    Prostate cancer Father    Heart attack Father        Paternal uncle also had MI   Depression Father    Obesity Father    Alcohol abuse Sister    Allergic rhinitis Brother    Hypertension Brother    Alcohol abuse Brother    Coronary artery disease Paternal Uncle    Heart attack Paternal Uncle    Heart attack Paternal Uncle    Coronary artery disease Paternal Uncle    Heart disease Paternal Grandfather    Angioedema Other    Eczema Other    Colon cancer Neg Hx    Colon polyps Neg Hx    Pancreatic cancer Neg Hx    Rectal cancer Neg Hx    Stomach cancer Neg Hx     OBJCTIVE -PE, EKG, labs   Wt Readings from Last 3 Encounters:  09/09/21 153 lb (69.4 kg)  09/01/21 153 lb 6.4 oz (69.6 kg)  07/22/21 155 lb 4 oz (70.4 kg)    Physical  Exam: BP 134/90   Pulse (!) 56   Ht 5\' 5"  (1.651 m)   Wt 153 lb 6.4 oz (69.6 kg)   SpO2 98%   BMI 25.53 kg/m  Physical Exam Vitals reviewed.  Constitutional:      General: He is not in acute distress.    Appearance: Normal appearance. He is normal weight. He is not ill-appearing or toxic-appearing.  HENT:     Head: Normocephalic and atraumatic.  Eyes:     Extraocular Movements: Extraocular movements intact.     Conjunctiva/sclera: Conjunctivae normal.     Pupils: Pupils are equal, round, and reactive to light.  Neck:     Vascular: No carotid bruit.  Cardiovascular:     Rate and Rhythm: Normal rate and regular rhythm. No extrasystoles are present.    Chest Wall: PMI is not displaced.     Pulses: Normal pulses and intact distal pulses.     Heart sounds: S1 normal and S2 normal. No murmur heard.    No friction rub. No gallop.  Pulmonary:     Effort: Pulmonary effort is normal. No respiratory distress.     Breath sounds: Normal breath sounds. No wheezing, rhonchi or rales.  Musculoskeletal:        General: No swelling. Normal range of motion.     Cervical back: Normal range of motion and neck supple.  Skin:    General: Skin is  warm and dry.  Neurological:     General: No focal deficit present.     Mental Status: He is alert and oriented to person, place, and time.     Cranial Nerves: No cranial nerve deficit (Grossly normal).     Gait: Gait normal.  Psychiatric:        Mood and Affect: Mood normal.        Behavior: Behavior normal.        Thought Content: Thought content normal.        Judgment: Judgment normal.     Adult ECG Report  Rate: 56;  Rhythm: sinus bradycardia and otherwise normal axis, intervals and durations ;   Narrative Interpretation: Normal given his level of exercise  Recent Labs: 08/19/2020: TC 186, TG 47, HDL 80, LDL 90.  A1c 5.6. 06/06/2021: CR 1.31, K+ 4.5.  ================================================== I spent a total of 36 minutes with the  patient spent in direct patient consultation.  Additional time spent with chart review  / charting (studies, outside notes, etc): 15 min Total Time: 51 min  Current medicines are reviewed at length with the patient today.  (+/- concerns) none  Notice: This dictation was prepared with Dragon dictation along with smart phrase technology. Any transcriptional errors that result from this process are unintentional and may not be corrected upon review.   Studies Ordered:  Orders Placed This Encounter  Procedures   EKG 12-Lead   No orders of the defined types were placed in this encounter.   Patient Instructions / Medication Changes & Studies & Tests Ordered    Patient Instructions  Medication Instructions:   No changes  *If you need a refill on your cardiac medications before your next appointment, please call your pharmacy*   Lab Work: Not needed    Testing/Procedures:  Not needed  Follow-Up: At [facility], you and your health needs are our priority.  As part of our continuing mission to provide you with exceptional heart care, we have created designated Provider Care Teams.  These Care Teams include your primary Cardiologist (physician) and Advanced Practice Providers (APPs -  Physician Assistants and Nurse Practitioners) who all work together to provide you with the care you need, when you need it.     Your next appointment:   24 month(s)  The format for your next appointment:   In Person  Provider:   None    Other Instructions    Dr Herbie Baltimore will send your primary recommendation -- for taking a statin     [name], MD, MS Bryan Lemma, M.D., M.S. Interventional Cardiologist  [facility]  Pager # [phone] Phone # [phone] [address] [place], [place] [address]    Thank you for choosing [facility] at [facility]!!

## 2021-09-01 ENCOUNTER — Encounter: Payer: Self-pay | Admitting: Cardiology

## 2021-09-01 ENCOUNTER — Ambulatory Visit (INDEPENDENT_AMBULATORY_CARE_PROVIDER_SITE_OTHER): Payer: Medicare Other | Admitting: Cardiology

## 2021-09-01 VITALS — BP 134/90 | HR 56 | Ht 65.0 in | Wt 153.4 lb

## 2021-09-01 DIAGNOSIS — E785 Hyperlipidemia, unspecified: Secondary | ICD-10-CM

## 2021-09-01 DIAGNOSIS — I2510 Atherosclerotic heart disease of native coronary artery without angina pectoris: Secondary | ICD-10-CM | POA: Diagnosis not present

## 2021-09-01 DIAGNOSIS — I77810 Thoracic aortic ectasia: Secondary | ICD-10-CM

## 2021-09-01 DIAGNOSIS — J45990 Exercise induced bronchospasm: Secondary | ICD-10-CM

## 2021-09-01 DIAGNOSIS — R7303 Prediabetes: Secondary | ICD-10-CM

## 2021-09-01 DIAGNOSIS — R931 Abnormal findings on diagnostic imaging of heart and coronary circulation: Secondary | ICD-10-CM | POA: Diagnosis not present

## 2021-09-01 DIAGNOSIS — Z8249 Family history of ischemic heart disease and other diseases of the circulatory system: Secondary | ICD-10-CM

## 2021-09-01 NOTE — Patient Instructions (Signed)
Medication Instructions:   No changes  *If you need a refill on your cardiac medications before your next appointment, please call your pharmacy*   Lab Work: Not needed    Testing/Procedures:  Not needed  Follow-Up: At [facility], you and your health needs are our priority.  As part of our continuing mission to provide you with exceptional heart care, we have created designated Provider Care Teams.  These Care Teams include your primary Cardiologist (physician) and Advanced Practice Providers (APPs -  Physician Assistants and Nurse Practitioners) who all work together to provide you with the care you need, when you need it.     Your next appointment:   24 month(s)  The format for your next appointment:   In Person  Provider:   None    Other Instructions    Dr Herbie Baltimore will send your primary recommendation -- for taking a statin

## 2021-09-09 ENCOUNTER — Encounter: Payer: Self-pay | Admitting: Family Medicine

## 2021-09-09 ENCOUNTER — Ambulatory Visit (INDEPENDENT_AMBULATORY_CARE_PROVIDER_SITE_OTHER): Payer: Medicare Other | Admitting: Family Medicine

## 2021-09-09 VITALS — BP 125/77 | HR 49 | Temp 98.3°F | Ht 66.0 in | Wt 153.0 lb

## 2021-09-09 DIAGNOSIS — Z0000 Encounter for general adult medical examination without abnormal findings: Secondary | ICD-10-CM | POA: Diagnosis not present

## 2021-09-09 DIAGNOSIS — Z1159 Encounter for screening for other viral diseases: Secondary | ICD-10-CM

## 2021-09-09 DIAGNOSIS — Z8249 Family history of ischemic heart disease and other diseases of the circulatory system: Secondary | ICD-10-CM

## 2021-09-09 DIAGNOSIS — Z5181 Encounter for therapeutic drug level monitoring: Secondary | ICD-10-CM

## 2021-09-09 DIAGNOSIS — Z125 Encounter for screening for malignant neoplasm of prostate: Secondary | ICD-10-CM

## 2021-09-09 DIAGNOSIS — Z79899 Other long term (current) drug therapy: Secondary | ICD-10-CM

## 2021-09-09 DIAGNOSIS — E559 Vitamin D deficiency, unspecified: Secondary | ICD-10-CM | POA: Diagnosis not present

## 2021-09-09 DIAGNOSIS — E785 Hyperlipidemia, unspecified: Secondary | ICD-10-CM | POA: Diagnosis not present

## 2021-09-09 DIAGNOSIS — Z23 Encounter for immunization: Secondary | ICD-10-CM

## 2021-09-09 DIAGNOSIS — R7303 Prediabetes: Secondary | ICD-10-CM

## 2021-09-09 DIAGNOSIS — I2510 Atherosclerotic heart disease of native coronary artery without angina pectoris: Secondary | ICD-10-CM

## 2021-09-09 DIAGNOSIS — Z114 Encounter for screening for human immunodeficiency virus [HIV]: Secondary | ICD-10-CM

## 2021-09-09 DIAGNOSIS — Z136 Encounter for screening for cardiovascular disorders: Secondary | ICD-10-CM

## 2021-09-09 DIAGNOSIS — Z87891 Personal history of nicotine dependence: Secondary | ICD-10-CM

## 2021-09-09 DIAGNOSIS — R931 Abnormal findings on diagnostic imaging of heart and coronary circulation: Secondary | ICD-10-CM

## 2021-09-09 LAB — CBC
HCT: 46.2 % (ref 39.0–52.0)
Hemoglobin: 15.3 g/dL (ref 13.0–17.0)
MCHC: 33.1 g/dL (ref 30.0–36.0)
MCV: 89.2 fl (ref 78.0–100.0)
Platelets: 285 10*3/uL (ref 150.0–400.0)
RBC: 5.18 Mil/uL (ref 4.22–5.81)
RDW: 15.3 % (ref 11.5–15.5)
WBC: 4.1 10*3/uL (ref 4.0–10.5)

## 2021-09-09 LAB — LIPID PANEL
Cholesterol: 223 mg/dL — ABNORMAL HIGH (ref 0–200)
HDL: 84.2 mg/dL (ref >39.00)
LDL Cholesterol: 124 mg/dL — ABNORMAL HIGH (ref 0–99)
NonHDL: 138.79
Total CHOL/HDL Ratio: 3
Triglycerides: 76 mg/dL (ref 0.0–149.0)
VLDL: 15.2 mg/dL (ref 0.0–40.0)

## 2021-09-09 LAB — HEMOGLOBIN A1C: Hgb A1c MFr Bld: 5.8 % (ref 4.6–6.5)

## 2021-09-09 LAB — COMPREHENSIVE METABOLIC PANEL
ALT: 24 U/L (ref 0–53)
AST: 25 U/L (ref 0–37)
Albumin: 4.8 g/dL (ref 3.5–5.2)
Alkaline Phosphatase: 41 U/L (ref 39–117)
BUN: 26 mg/dL — ABNORMAL HIGH (ref 6–23)
CO2: 26 mEq/L (ref 19–32)
Calcium: 9.7 mg/dL (ref 8.4–10.5)
Chloride: 101 mEq/L (ref 96–112)
Creatinine, Ser: 1.13 mg/dL (ref 0.40–1.50)
GFR: 68.24 mL/min (ref >60.00)
Glucose, Bld: 111 mg/dL — ABNORMAL HIGH (ref 70–99)
Potassium: 4.6 mEq/L (ref 3.5–5.1)
Sodium: 138 mEq/L (ref 135–145)
Total Bilirubin: 0.7 mg/dL (ref 0.2–1.2)
Total Protein: 7.3 g/dL (ref 6.0–8.3)

## 2021-09-09 LAB — PSA, MEDICARE: PSA: 1.05 ng/ml (ref 0.10–4.00)

## 2021-09-09 LAB — TSH: TSH: 1.99 u[IU]/mL (ref 0.35–5.50)

## 2021-09-09 LAB — MAGNESIUM: Magnesium: 2.2 mg/dL (ref 1.5–2.5)

## 2021-09-09 LAB — VITAMIN D 25 HYDROXY (VIT D DEFICIENCY, FRACTURES): VITD: 32.75 ng/mL (ref 30.00–100.00)

## 2021-09-09 LAB — VITAMIN B12: Vitamin B-12: 431 pg/mL (ref 211–911)

## 2021-09-09 MED ORDER — ZOSTER VAC RECOMB ADJUVANTED 50 MCG/0.5ML IM SUSR: 0.5000 mL | Freq: Once | INTRAMUSCULAR | 0 refills | Status: AC

## 2021-09-09 MED ORDER — TETANUS-DIPHTH-ACELL PERTUSSIS 5-2.5-18.5 LF-MCG/0.5 IM SUSP: 0.5000 mL | Freq: Once | INTRAMUSCULAR | 0 refills | Status: AC

## 2021-09-09 NOTE — Progress Notes (Unsigned)
Welcome to [facility] well exam Chief Complaint  Patient presents with   Medicare Wellness    Pt is fasting    Patient Care Team    Relationship Specialty Notifications Start End  Ma Hillock, DO PCP - General Family Medicine  06/06/21   [facility] Done eye care Consulting Physician Optometry  06/09/21      History of Present Ilness: Eric [name], 66 y.o. , male presents today for welcome to Medicare wellness visit.  Health maintenance:  Colonoscopy:12/2015, Dr. Ardis Hughs with 10-year follow-up per patient Immunizations: tdap 2010-prescription for him today, PNA administered today, shingrix printed prescription for him today, influenza (encouraged yearly), covid completed Infectious disease screening: hiv and hep c completed today PSA: 08/19/2020 (0.8) . Collected today Glaucoma screen: [facility] recently  Hearing: Whisper test, no barriers identified.    Hearing Screening   500Hz  1000Hz  2000Hz  4000Hz   Right ear 20 20 20 20   Left ear 20 20 20 20    Vision Screening   Right eye Left eye Both eyes  Without correction 20/25 20/20 20/25   With correction       Past medical, surgical, family and social histories reviewed (including experiences with illnesses, hospital stays, operations, injuries, and treatments):  Past Medical History:  Diagnosis Date   Allergies    Allergy 1976   Chicken pox    Chronic rhinitis 07/22/2021   Coronary artery disease, non-occlusive    Elevated Coronary Calcium Score - Non-obstructive CAD (30-50%) on Coronary CTA   Dislocation of shoulder, left, closed    Eczema    Ganglion cyst 09/17/2010   GERD (gastroesophageal reflux disease)    Gluten intolerance    Leads to diffuse GI issues   Heart disease    Hyperlipemia    Kidney disease    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   Coronary CT Angiograpm  July-August 2018   1) Calcium score 147 which is 74th percentile for age and sex 2) Right dominant coronary arteries with Less than 30% calcified plaque in proximal LAD. Less than 50% calcified plaque in proximal RCA, Less than 30% soft plauqe in mid RCA and Less than 30% calcified plaque in distal RCA   [place]   TOE SURGERY  2010   [place]     Family History  Problem Relation Age of Onset   Hypertension Mother    Breast cancer Mother    Hearing loss Mother    Hyperlipidemia Father    Hypertension Father    Dementia Father    Heart disease Father    Prostate cancer Father    Heart attack Father        Paternal uncle also had MI   Depression Father    Obesity Father    Alcohol abuse Sister    Allergic rhinitis Brother    Hypertension Brother    Alcohol abuse Brother    Coronary artery disease Paternal Uncle    Heart attack Paternal Uncle    Heart attack Paternal Uncle    Coronary artery disease Paternal Uncle    Heart disease Paternal Grandfather    Angioedema Other    Eczema Other  Colon cancer Neg Hx    Colon polyps Neg Hx    Pancreatic cancer Neg Hx    Rectal cancer Neg Hx    Stomach cancer Neg Hx    Social History   Social History Narrative   He is been married for 37 years.    Masters degree executive VP for [employer]   He is an avid exercise enthusiast - runs, bicycling and swimming at least 5 days a week for 40 minutes in time.   Has smoke alarm in the home   Wears a seatbelt.   Feels safe in his relationship.    All medications verified Allergies as of 09/09/2021       Reactions   Gluten Meal Diarrhea, Other (See Comments)   GI        Medication List        Accurate as of September 09, 2021 11:59 PM. If you have any questions, ask your nurse or doctor.          albuterol 108 (90 Base) MCG/ACT  inhaler Commonly known as: Ventolin HFA Inhale 2 puffs into the lungs every 4 (four) hours as needed for wheezing or shortness of breath (coughing fits).   ASPIRIN 81 PO Take 1 tablet by mouth daily as needed.   COD LIVER OIL PO Take by mouth.   Omeprazole 20 MG Tbec Take 40 mg by mouth.   pravastatin 80 MG tablet Commonly known as: PRAVACHOL Take 1 tablet (80 mg total) by mouth daily.   Tdap 5-2.5-18.5 LF-MCG/0.5 injection Commonly known as: BOOSTRIX Inject 0.5 mLs into the muscle once for 1 dose. Started by: Howard Pouch, DO   TH MAGNESIUM PO Take by mouth.   Zoster Vaccine Adjuvanted injection Commonly known as: SHINGRIX Inject 0.5 mLs into the muscle once for 1 dose. Started by: Howard Pouch, DO           09/09/2021    8:13 AM 06/06/2021    1:46 PM  Depression screen PHQ 2/9  Decreased Interest 0 0  Down, Depressed, Hopeless 0 0  PHQ - 2 Score 0 0       No data to display          Cognitive assessment:  No barriers identified    09/09/2021    8:13 AM  6CIT Screen  What Year? 0 points  What month? 0 points  What time? 0 points  Count back from 20 0 points  Months in reverse 0 points  Repeat phrase 0 points  Total Score 0 points    [facility] Office Visit from 09/09/2021 in [place]  AUDIT-C Score 5        Immunizations: Immunization History  Administered Date(s) Administered   Influenza Split 12/23/2011, 11/28/2018, 11/21/2019   Influenza,inj,Quad PF,6+ Mos 01/17/2014, 12/11/2014, 12/11/2015, 12/11/2016, 11/14/2020   Moderna Covid-19 Vaccine Bivalent Booster 69yrs & up 10/21/2020   Moderna Sars-Covid-2 Vaccination 05/23/2020   PFIZER(Purple Top)SARS-COV-2 Vaccination 04/09/2019, 05/02/2019, 11/21/2019, 05/23/2020   PNEUMOCOCCAL CONJUGATE-20 09/09/2021   Pfizer Covid-19 Vaccine Bivalent Booster 11yrs & up 10/21/2020   Td 05/15/2008    Exercise: Current Exercise Habits: Structured exercise class, Type of  exercise: calisthenics;strength training/weights, Time (Minutes): > 60, Frequency (Times/Week): >7, Weekly Exercise (Minutes/Week): 0, Intensity: Intense    Diet: Regular  Functional Status Survey: Get up and go test: steady and less than 20 seconds.  Is the patient deaf or have difficulty hearing?: No Does the patient have difficulty seeing,  even when wearing glasses/contacts?: No Does the patient have difficulty concentrating, remembering, or making decisions?: No Does the patient have difficulty walking or climbing stairs?: No Does the patient have difficulty dressing or bathing?: No Does the patient have difficulty doing errands alone such as visiting a doctor's office or shopping?: No Current Exercise Habits: Structured exercise class, Type of exercise: calisthenics;strength training/weights, Time (Minutes): > 60, Frequency (Times/Week): >7, Weekly Exercise (Minutes/Week): 0, Intensity: Intense      09/09/2021    8:14 AM 09/09/2021    7:59 AM 09/02/2021    9:21 AM 09/02/2021    9:15 AM 06/06/2021    1:46 PM  Fall Risk   Falls in the past year? 0 0 0 0 0  Number falls in past yr: 0  0  0  Injury with Fall? 0  0  0  Risk for fall due to : No Fall Risks    No Fall Risks  Follow up Falls evaluation completed    Falls evaluation completed    Advanced Care Planning: patient has completed. He will bring copy to office.   BP 125/77   Pulse (!) 49   Temp 98.3 F (36.8 C) (Oral)   Ht $R'5\' 6"'yT$  (1.676 m)   Wt 153 lb (69.4 kg)   SpO2 100%   BMI 24.69 kg/m  Physical Exam Constitutional:      General: He is not in acute distress.    Appearance: Normal appearance. He is not ill-appearing, toxic-appearing or diaphoretic.  HENT:     Head: Normocephalic and atraumatic.     Right Ear: Tympanic membrane, ear canal and external ear normal. There is no impacted cerumen.     Left Ear: Tympanic membrane, ear canal and external ear normal. There is no impacted cerumen.     Nose: Nose normal. No  congestion or rhinorrhea.     Mouth/Throat:     Mouth: Mucous membranes are moist.     Pharynx: Oropharynx is clear. No oropharyngeal exudate or posterior oropharyngeal erythema.  Eyes:     General: No scleral icterus.       Right eye: No discharge.        Left eye: No discharge.     Extraocular Movements: Extraocular movements intact.     Pupils: Pupils are equal, round, and reactive to light.  Cardiovascular:     Rate and Rhythm: Normal rate and regular rhythm.     Pulses: Normal pulses.     Heart sounds: Normal heart sounds. No murmur heard.    No friction rub. No gallop.  Pulmonary:     Effort: Pulmonary effort is normal. No respiratory distress.     Breath sounds: Normal breath sounds. No stridor. No wheezing, rhonchi or rales.  Chest:     Chest wall: No tenderness.  Abdominal:     General: Abdomen is flat. Bowel sounds are normal. There is no distension.     Palpations: Abdomen is soft. There is no mass.     Tenderness: There is no abdominal tenderness. There is no right CVA tenderness, left CVA tenderness, guarding or rebound.     Hernia: No hernia is present.  Musculoskeletal:        General: No swelling or tenderness. Normal range of motion.     Cervical back: Normal range of motion and neck supple.     Right lower leg: No edema.     Left lower leg: No edema.  Lymphadenopathy:     Cervical: No cervical  adenopathy.  Skin:    General: Skin is warm and dry.     Coloration: Skin is not jaundiced.     Findings: No bruising, lesion or rash.  Neurological:     General: No focal deficit present.     Mental Status: He is alert and oriented to person, place, and time. Mental status is at baseline.     Cranial Nerves: No cranial nerve deficit.     Sensory: No sensory deficit.     Motor: No weakness.     Coordination: Coordination normal.     Gait: Gait normal.     Deep Tendon Reflexes: Reflexes normal.  Psychiatric:        Mood and Affect: Mood normal.        Behavior:  Behavior normal.        Thought Content: Thought content normal.        Judgment: Judgment normal.     Assessment and Plan: Eric [name] is a 66 y.o. male present for  Welcome to Medicare preventive visit Patient was encouraged to exercise greater than 150 minutes a week. Patient was encouraged to choose a diet filled with fresh fruits and vegetables, and lean meats. AVS provided to patient today for education/recommendation on gender specific health and safety maintenance. Colonoscopy:12/2015, Dr. Ardis Hughs with 10-year follow-up per patient Immunizations: tdap 2010-prescription for him today, PNA administered today, shingrix printed prescription for him today, influenza (encouraged yearly), covid completed Infectious disease screening: hiv and hep c completed today Former smoker/AAA screen: completed if male 37- 71 and if ever smoked and patient agreed to screening. > discussed and pt is agreeable to screening test  Hyperlipidemia with target LDL less than 100/Family history of premature CAD/Coronary artery disease, non-occlusive/Agatston coronary artery calcium score 276; 73% - CBC - Comp Met (CMET) - TSH - Lipid panel Discussed LDL goal of less than 70 with his history.  We discussed discontinuing pravastatin and starting atorvastatin, he is agreeable to this today.  Will wait on lab results and adjust dose accordingly. Prediabetes - Hemoglobin A1c - Vitamin D (25 hydroxy) Need for Tdap vaccination - Tdap (BOOSTRIX) 5-2.5-18.5 LF-MCG/0.5 injection; Inject 0.5 mLs into the muscle once for 1 dose.  Dispense: 0.5 mL; Refill: 0 Encounter for hepatitis C screening test for low risk patient - Hepatitis C Antibody Encounter for screening for HIV - HIV antibody (with reflex) Prostate cancer screening - PSA, Medicare ( [place] [facility] only) Encounter for monitoring long-term proton pump inhibitor therapy - B12 - Magnesium Vitamin D deficiency - Vitamin D (25 hydroxy) Need for  pneumococcal 20-valent conjugate vaccination - Pneumococcal conjugate vaccine 20-valent (Prevnar 20)  Meds ordered this encounter  Medications   Zoster Vaccine Adjuvanted [facility]) injection    Sig: Inject 0.5 mLs into the muscle once for 1 dose.    Dispense:  0.5 mL    Refill:  0   Tdap (BOOSTRIX) 5-2.5-18.5 LF-MCG/0.5 injection    Sig: Inject 0.5 mLs into the muscle once for 1 dose.    Dispense:  0.5 mL    Refill:  0   Orders Placed This Encounter  Procedures   [place] MEDICARE SCREENING   Pneumococcal conjugate vaccine 20-valent (Prevnar 20)   CBC   Comp Met (CMET)   TSH   Lipid panel   Hemoglobin A1c   Hepatitis C Antibody   HIV antibody (with reflex)   PSA, Medicare ( [place] [facility] only)   B12   Magnesium   Vitamin D (25  hydroxy)   Referral Orders  No referral(s) requested today     Electronically Signed by: Howard Pouch, DO [place]

## 2021-09-09 NOTE — Patient Instructions (Addendum)
Eric [name] , Thank you for taking time to come for your Medicare Wellness Visit. I appreciate your ongoing commitment to your health goals. Please review the following plan we discussed and let me know if I can assist you in the future.   Health Maintenance After Age 65 After age 61, you are at a higher risk for certain long-term diseases and infections as well as injuries from falls. Falls are a major cause of broken bones and head injuries in people who are older than age 65. Getting regular preventive care can help to keep you healthy and well. Preventive care includes getting regular testing and making lifestyle changes as recommended by your health care provider. Talk with your health care provider about: Which screenings and tests you should have. A screening is a test that checks for a disease when you have no symptoms. A diet and exercise plan that is right for you. What should I know about screenings and tests to prevent falls? Screening and testing are the best ways to find a health problem early. Early diagnosis and treatment give you the best chance of managing medical conditions that are common after age 65. Certain conditions and lifestyle choices may make you more likely to have a fall. Your health care provider may recommend: Regular vision checks. Poor vision and conditions such as cataracts can make you more likely to have a fall. If you wear glasses, make sure to get your prescription updated if your vision changes. Medicine review. Work with your health care provider to regularly review all of the medicines you are taking, including over-the-counter medicines. Ask your health care provider about any side effects that may make you more likely to have a fall. Tell your health care provider if any medicines that you take make you feel dizzy or sleepy. Strength and balance checks. Your health care provider may recommend certain tests to check your strength and balance while standing,  walking, or changing positions. Foot health exam. Foot pain and numbness, as well as not wearing proper footwear, can make you more likely to have a fall. Screenings, including: Osteoporosis screening. Osteoporosis is a condition that causes the bones to get weaker and break more easily. Blood pressure screening. Blood pressure changes and medicines to control blood pressure can make you feel dizzy. Depression screening. You may be more likely to have a fall if you have a fear of falling, feel depressed, or feel unable to do activities that you used to do. Alcohol use screening. Using too much alcohol can affect your balance and may make you more likely to have a fall. Follow these instructions at home: Lifestyle Do not drink alcohol if: Your health care provider tells you not to drink. If you drink alcohol: Limit how much you have to: 0-1 drink a day for women. 0-2 drinks a day for men. Know how much alcohol is in your drink. In the [place] one drink equals one 12 oz bottle of beer (355 mL), one 5 oz glass of wine (148 mL), or one 1 oz glass of hard liquor (44 mL). Do not use any products that contain nicotine or tobacco. These products include cigarettes, chewing tobacco, and vaping devices, such as e-cigarettes. If you need help quitting, ask your health care provider. Activity  Follow a regular exercise program to stay fit. This will help you maintain your balance. Ask your health care provider what types of exercise are appropriate for you. If you need a cane or walker, use  it as recommended by your health care provider. Wear supportive shoes that have nonskid soles. Safety  Remove any tripping hazards, such as rugs, cords, and clutter. Install safety equipment such as grab bars in bathrooms and safety rails on stairs. Keep rooms and walkways well-lit. General instructions Talk with your health care provider about your risks for falling. Tell your health care provider if: You fall.  Be sure to tell your health care provider about all falls, even ones that seem minor. You feel dizzy, tiredness (fatigue), or off-balance. Take over-the-counter and prescription medicines only as told by your health care provider. These include supplements. Eat a healthy diet and maintain a healthy weight. A healthy diet includes low-fat dairy products, low-fat (lean) meats, and fiber from whole grains, beans, and lots of fruits and vegetables. Stay current with your vaccines. Schedule regular health, dental, and eye exams. Summary Having a healthy lifestyle and getting preventive care can help to protect your health and wellness after age 65. Screening and testing are the best way to find a health problem early and help you avoid having a fall. Early diagnosis and treatment give you the best chance for managing medical conditions that are more common for people who are older than age 65. Falls are a major cause of broken bones and head injuries in people who are older than age 65. Take precautions to prevent a fall at home. Work with your health care provider to learn what changes you can make to improve your health and wellness and to prevent falls. This information is not intended to replace advice given to you by your health care provider. Make sure you discuss any questions you have with your health care provider. Document Revised: 06/10/2020 Document Reviewed: 06/10/2020 Elsevier Patient Education  2023 [employer].

## 2021-09-10 ENCOUNTER — Telehealth: Payer: Self-pay | Admitting: Family Medicine

## 2021-09-10 LAB — HEPATITIS C ANTIBODY: Hepatitis C Ab: NONREACTIVE

## 2021-09-10 LAB — HIV ANTIBODY (ROUTINE TESTING W REFLEX): HIV 1&2 Ab, 4th Generation: NONREACTIVE

## 2021-09-10 MED ORDER — ATORVASTATIN CALCIUM 80 MG PO TABS: 80.0000 mg | ORAL_TABLET | Freq: Every evening | ORAL | 3 refills | Status: DC

## 2021-09-10 NOTE — Telephone Encounter (Signed)
Please call patient Liver, kidney and thyroid function are normal Prostate cancer screening is normal Vitamin D levels are normal B12 levels are in the low normal range at 431.  He could benefit from increasing his daily B12 by taking a supplement of B12 [phone] mcg sublingual daily. Blood cell counts and electrolytes are normal Diabetes screening/A1c is normal  DL/bad cholesterol is higher than his needed goal of an LDL less than 70.  His LDL is 124.   -We discussed during his appointment stopping pravastatin and starting a different cholesterol medicine that has better cardiovascular protection and coverage.  I have called in the atorvastatin 80 mg for him to take before bed.  DC pravastatin.  The hepatitis C/HIV screening is pending and this takes a few days to return.  We will call him if abnormal or so otherwise we will pend to MyChart.

## 2021-09-10 NOTE — Telephone Encounter (Signed)
LVM for pt to CB regarding results.  

## 2021-09-10 NOTE — Telephone Encounter (Signed)
Patient returning call about lab results.  Please call patient back when available.  

## 2021-09-11 NOTE — Telephone Encounter (Signed)
LVM for pt to CB regarding results.  

## 2021-09-11 NOTE — Telephone Encounter (Signed)
Spoke with pt regarding labs and instructions.   

## 2021-09-11 NOTE — Telephone Encounter (Signed)
Patient returning call about lab results.  Please call patient back when available.  

## 2021-09-15 ENCOUNTER — Encounter: Payer: Self-pay | Admitting: Family Medicine

## 2021-09-16 NOTE — Telephone Encounter (Signed)
If he would like to come in in 3 months for an appointment we certainly can see him again then.  However his cholesterol was not horrible to begin with and we are increasing management therefore I expect it to be better and likely at goal.  Reports also okay if he would rather wait until the 50-month follow-up to recheck his cholesterol. Either are okay

## 2021-09-16 NOTE — Telephone Encounter (Signed)
Please advise when pt should f/u

## 2021-09-22 ENCOUNTER — Ambulatory Visit (HOSPITAL_BASED_OUTPATIENT_CLINIC_OR_DEPARTMENT_OTHER)
Admission: RE | Admit: 2021-09-22 | Discharge: 2021-09-22 | Disposition: A | Discharge status: Home / Self Care | Payer: Medicare Other | Source: Ambulatory Visit | Attending: Family Medicine | Admitting: Family Medicine

## 2021-09-22 DIAGNOSIS — Z136 Encounter for screening for cardiovascular disorders: Secondary | ICD-10-CM | POA: Diagnosis present

## 2021-09-22 DIAGNOSIS — Z87891 Personal history of nicotine dependence: Secondary | ICD-10-CM | POA: Diagnosis present

## 2021-09-23 ENCOUNTER — Telehealth: Payer: Self-pay | Admitting: Family Medicine

## 2021-09-23 DIAGNOSIS — I77810 Thoracic aortic ectasia: Secondary | ICD-10-CM | POA: Insufficient documentation

## 2021-09-23 DIAGNOSIS — I77811 Abdominal aortic ectasia: Secondary | ICD-10-CM | POA: Insufficient documentation

## 2021-09-23 NOTE — Telephone Encounter (Signed)
Please inform patient His abdominal ultrasound screening for aneurysm is negative for abdominal aneurysm. 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  This means that the aorta itself is diffusely a little larger throughout the aorta and not just one area being larger (which would be an aneurysm). Recommend follow-up ultrasound every 5 years.

## 2021-09-24 NOTE — Telephone Encounter (Signed)
Spoke with pt regarding labs and instructions.   

## 2021-09-28 ENCOUNTER — Encounter: Payer: Self-pay | Admitting: Cardiology

## 2021-09-28 NOTE — Assessment & Plan Note (Addendum)
Not surprisingly he had a modest increase in his Coronary Calcium Score from previous score of 147-276.  At this level, we do want target LDL less than 70 and he is due to have labs checked soon by PCP.  The fact that he is active at and exercising/running and bicycling as vigorously as he is doing, with suggestive he has not had obstructive CAD and probably would not benefit from a stress test.  If he were to have any reduction in exercise tolerance or worsening exertional dyspnea then we could consider a relook Coronary CTA in the future.   For the most part, we discussed the process of atherosclerosis and how coronary calcium is involved.  It is not unusual at the Coronary Calcium Score will go up unless the lipids been aggressively controlled.  In fact this could simply mean stabilization of existing plaque as opposed to progression of plaque.  We will follow-up with him every couple years.  Sooner if symptoms warrant.   He is currently only on pravastatin 80 mg.  May need to be more aggressive with that.  Also elevated blood pressure ensure that he is maintaining stable pressures currently 134/90 is borderline  A1c has been fairly normal, continue to monitor for glycemic control.  Per Coronary Calcium Score is a less than 300, the general consensus among his cardiologist is that aspirin may not be warranted.  However, if lipids are not adequately controlled would agree with initiating 81 mg aspirin if only every other day.

## 2021-09-28 NOTE — Assessment & Plan Note (Signed)
He is relatively prominent family history for CAD and therefore being a little more aggressive with management is warranted.  As such, would be agreeable for initiating aspirin and LDL is greater than 100.  Targeting LDL of less than 70, A1c less than 5.8, BP less than 135/80.

## 2021-09-28 NOTE — Assessment & Plan Note (Signed)
Minimally dilated thoracic aorta.  Also noted segmental dilation of the abdominal aorta.  He has abdominal aortic ultrasound ordered.  We can reassess thoracic aorta in about 2 years with a new CT scan (ultrasound does not work in the chest)

## 2021-09-28 NOTE — Assessment & Plan Note (Signed)
With Coronary Calcium Score 170 target LDL is less than 100.  But now probably should have an LDL less than 70 since there has potentially been a progression of disease (if only calcification).  He is currently taking pravastatin and has not had lipids checked in over a year.  Should be due for lipid check soon with PCP.  Would recommend converting to rosuvastatin at 20 mg daily if slightly increased, if well out of goal, we would switch to rosuvastatin 40 mg daily, targeting LDL less than 70.

## 2021-09-28 NOTE — Assessment & Plan Note (Signed)
Follow glycemic control closely.  Low threshold to consider initiation with cardioprotective medications such as SGLT2 inhibitor (plus or minus GLP-1 agonist).  Also metformin would be reasonable to initiate early on.

## 2021-09-28 NOTE — Assessment & Plan Note (Signed)
He says it actually seems to doing better with using the rescue inhaler.  He thinks it may actually be related to allergies making it worse.

## 2021-09-29 ENCOUNTER — Encounter: Payer: Self-pay | Admitting: Cardiology

## 2021-10-01 NOTE — Telephone Encounter (Signed)
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  We would probably want to just follow screening evaluations every couple years to ensure that nothing is changed, but it does not cause extra concerned because we are aggressively treating your risk factors of coronary artery disease which is the same treatment for aortic atherosclerosis.  Bryan Lemma, MD

## 2021-10-22 NOTE — Progress Notes (Unsigned)
Follow Up Note  RE: Eric [name] MRN: [id number] DOB: 08/20/1955 Date of Office Visit: 10/23/2021  Referring provider: Natalia Leatherwood, DO Primary care provider: Natalia Leatherwood, DO  Chief Complaint: No chief complaint on file.  History of Present Illness: I had the pleasure of seeing Eric [name] for a follow up visit at the [facility] on 10/22/2021. He is a 66 y.o. male, who is being followed for exercise-induced asthma and chronic rhinitis. His previous allergy office visit was on 07/22/2021 with Dr. Selena Batten. Today is a regular follow up visit.  Dietary counseling and surveillance Avoiding gluten due to gluten intolerance - improved eczema and GI symptoms with avoidance. Noted brain fog and headaches with legumes/tree nut ingestion.  Discussed with patient that skin prick testing and bloodwork (food IgE levels) check for IgE mediated reactions which his clinical presentation does not support.  No indication for any skin testing to foods today.  Keep a food journal with symptoms and foods eaten. Recommend to reintroduce foods one by one a few days apart and see if he develops any symptoms. If no issues then okay to consume. If has issues then continue to avoid.    Exercise-induced asthma Noted shortness of breath with swimming and first mile running. Sometimes has coughing and wheezing at the end. Runs 6 miles consistently. Denies any prior asthma diagnosis or inhaler use. Today's spirometry was normal. Given clinical history there is some concern for EIB. I doubt this has anything to do with autoimmune issues. Discussed that if he is concerned for autoimmune diseases then recommend rheumatology evaluation next. Prior to physical activity: May use albuterol rescue inhaler 2 puffs 5 to 15 minutes prior to strenuous physical activities. Demonstrated proper use. If you notice any side effects from it let us know. You may take an additional 2 puffs at the end of your  exercise if you have coughing, wheezing.    Chronic rhinitis Some mild symptoms in the spring and fall. Tried zyrtec with no benefit. Skin testing in 2006 was positive to mold per patient. No prior AIT. Today's skin prick testing was negative to indoor/outdoor allergens. Monitor symptoms. Use over the counter antihistamines such as Zyrtec (cetirizine), Claritin (loratadine), Allegra (fexofenadine), or Xyzal (levocetirizine) daily as needed. May switch antihistamines every few months. Use Flonase (fluticasone) nasal spray 1 spray per nostril twice a day as needed for nasal congestion.    Return in about 3 months (around 10/22/2021).  Assessment and Plan: Eric [name] is a 66 y.o. male with: No problem-specific Assessment & Plan notes found for this encounter.  No follow-ups on file.  No orders of the defined types were placed in this encounter.  Lab Orders  No laboratory test(s) ordered today    Diagnostics: Spirometry:  Tracings reviewed. His effort: {Blank single:19197::"Good reproducible efforts.","It was hard to get consistent efforts and there is a question as to whether this reflects a maximal maneuver.","Poor effort, data can not be interpreted."} FVC: ***L FEV1: ***L, ***% predicted FEV1/FVC ratio: ***% Interpretation: {Blank single:19197::"Spirometry consistent with mild obstructive disease","Spirometry consistent with moderate obstructive disease","Spirometry consistent with severe obstructive disease","Spirometry consistent with possible restrictive disease","Spirometry consistent with mixed obstructive and restrictive disease","Spirometry uninterpretable due to technique","Spirometry consistent with normal pattern","No overt abnormalities noted given today's efforts"}.  Please see scanned spirometry results for details.  Skin Testing: {Blank single:19197::"Select foods","Environmental allergy panel","Environmental allergy panel and select foods","Food allergy  panel","None","Deferred due to recent antihistamines use"}. *** Results discussed with patient/family.  Medication List:  Current Outpatient Medications  Medication Sig Dispense Refill  . albuterol (VENTOLIN HFA) 108 (90 Base) MCG/ACT inhaler Inhale 2 puffs into the lungs every 4 (four) hours as needed for wheezing or shortness of breath (coughing fits). 18 g 1  . ASPIRIN 81 PO Take 1 tablet by mouth daily as needed.    Marland Kitchen atorvastatin (LIPITOR) 80 MG tablet Take 1 tablet (80 mg total) by mouth at bedtime. 90 tablet 3  . COD LIVER OIL PO Take by mouth.    . Omeprazole 20 MG TBEC Take 40 mg by mouth.    . TH MAGNESIUM PO Take by mouth.     No current facility-administered medications for this visit.   Allergies: Allergies  Allergen Reactions  . Gluten Meal Diarrhea and Other (See Comments)    GI   I reviewed his past medical history, social history, family history, and environmental history and no significant changes have been reported from his previous visit.  Review of Systems  Constitutional:  Negative for appetite change, chills, fever and unexpected weight change.  HENT:  Negative for congestion and rhinorrhea.   Eyes:  Negative for itching.  Respiratory:  Positive for shortness of breath. Negative for cough, chest tightness and wheezing.        Only with exercise.   Cardiovascular:  Negative for chest pain.  Gastrointestinal:  Negative for abdominal pain.  Genitourinary:  Negative for difficulty urinating.  Skin:  Negative for rash.  Allergic/Immunologic: Negative for environmental allergies.  Neurological:  Negative for headaches.   Objective: There were no vitals taken for this visit. There is no height or weight on file to calculate BMI. Physical Exam Vitals and nursing note reviewed.  Constitutional:      Appearance: Normal appearance. He is well-developed.  HENT:     Head: Normocephalic and atraumatic.     Right Ear: Tympanic membrane and external ear normal.      Left Ear: Tympanic membrane and external ear normal.     Nose: Nose normal.     Mouth/Throat:     Mouth: Mucous membranes are moist.     Pharynx: Oropharynx is clear.  Eyes:     Conjunctiva/sclera: Conjunctivae normal.  Cardiovascular:     Rate and Rhythm: Normal rate and regular rhythm.     Heart sounds: Normal heart sounds. No murmur heard.    No friction rub. No gallop.  Pulmonary:     Effort: Pulmonary effort is normal.     Breath sounds: Normal breath sounds. No wheezing, rhonchi or rales.  Musculoskeletal:     Cervical back: Neck supple.  Skin:    General: Skin is warm.     Findings: No rash.  Neurological:     Mental Status: He is alert and oriented to person, place, and time.  Psychiatric:        Behavior: Behavior normal.  Previous notes and tests were reviewed. The plan was reviewed with the patient/family, and all questions/concerned were addressed.  It was my pleasure to see Jarquez today and participate in his care. Please feel free to contact me with any questions or concerns.  Sincerely,  Rexene Alberts, DO Allergy & Immunology  [facility] office: [place] office: [phone]

## 2021-10-23 ENCOUNTER — Ambulatory Visit (INDEPENDENT_AMBULATORY_CARE_PROVIDER_SITE_OTHER): Payer: Medicare Other | Admitting: Allergy

## 2021-10-23 ENCOUNTER — Encounter: Payer: Self-pay | Admitting: Allergy

## 2021-10-23 VITALS — BP 118/76 | HR 87 | Temp 97.9°F | Ht 66.0 in | Wt 150.8 lb

## 2021-10-23 DIAGNOSIS — J45990 Exercise induced bronchospasm: Secondary | ICD-10-CM | POA: Diagnosis not present

## 2021-10-23 DIAGNOSIS — Z713 Dietary counseling and surveillance: Secondary | ICD-10-CM | POA: Diagnosis not present

## 2021-10-23 DIAGNOSIS — J310 Chronic rhinitis: Secondary | ICD-10-CM

## 2021-10-23 MED ORDER — ALBUTEROL SULFATE HFA 108 (90 BASE) MCG/ACT IN AERS: 2.0000 | INHALATION_SPRAY | RESPIRATORY_TRACT | 1 refills | Status: DC | PRN

## 2021-10-23 NOTE — Assessment & Plan Note (Signed)
Past history - Some mild symptoms in the spring and fall. Tried zyrtec with no benefit. Skin testing in 2006 was positive to mold per patient. No prior AIT. 2023 skin prick testing was negative to indoor/outdoor allergens. Interim history - only uses Nasacort on rare occasions.  . Use Flonase (fluticasone) or Nasacort nasal spray 1 spray per nostril twice a day as needed for nasal congestion.

## 2021-10-23 NOTE — Assessment & Plan Note (Signed)
Past history - Avoiding gluten due to gluten intolerance - improved eczema and GI symptoms with avoidance. Noted brain fog and headaches with legumes/tree nut ingestion.  Interim history - interested in tree nuts and legumes reintroduction.   Recommend to reintroduce foods (legumes and tree nuts) one by one a few days apart and see if you notice any symptoms.  Start with a few nuts at a time.   If no issues then okay to consume.  If you notice issues then continue to avoid.

## 2021-10-23 NOTE — Patient Instructions (Addendum)
Food Keep a food journal with symptoms and foods eaten. Recommend to reintroduce foods (legumes and tree nuts) one by one a few days apart and see if you notice any symptoms. Start with a few nuts at a time.  If no issues then okay to consume. If you notice issues then continue to avoid.   Breathing Prior to physical activity: May use albuterol rescue inhaler 1-2 puffs 5 to 15 minutes prior to strenuous physical activities. If you notice any side effects from it let us know. You may take an additional 2 puffs at the end of your exercise if you have coughing, wheezing.  Rhinitis 2023 skin testing was negative to indoor/outdoor allergens.  Use Flonase (fluticasone) or Nasacort nasal spray 1 spray per nostril twice a day as needed for nasal congestion.   Follow up in 12 months or sooner if needed.

## 2021-10-23 NOTE — Assessment & Plan Note (Signed)
Past history - Noted shortness of breath with swimming and first mile running. Sometimes has coughing and wheezing at the end. Runs 6 miles consistently. Denies any prior asthma diagnosis or inhaler use. 2023 spirometry was normal. Interim history - noted improvement with premedicating with albuterol prior to exertion. . Prior to physical activity: May use albuterol rescue inhaler 1-2 puffs 5 to 15 minutes prior to strenuous physical activities. o You may take an additional 2 puffs at the end of your exercise if you have coughing, wheezing.

## 2021-10-27 ENCOUNTER — Encounter: Payer: Self-pay | Admitting: Family Medicine

## 2021-10-28 ENCOUNTER — Ambulatory Visit (INDEPENDENT_AMBULATORY_CARE_PROVIDER_SITE_OTHER): Payer: Medicare Other | Admitting: Family Medicine

## 2021-10-28 VITALS — BP 127/74 | HR 58 | Temp 98.3°F | Ht 66.0 in | Wt 153.0 lb

## 2021-10-28 DIAGNOSIS — J4521 Mild intermittent asthma with (acute) exacerbation: Secondary | ICD-10-CM | POA: Diagnosis not present

## 2021-10-28 DIAGNOSIS — J209 Acute bronchitis, unspecified: Secondary | ICD-10-CM | POA: Diagnosis not present

## 2021-10-28 MED ORDER — PREDNISONE 20 MG PO TABS: 40.0000 mg | ORAL_TABLET | Freq: Every day | ORAL | 0 refills | Status: DC

## 2021-10-28 MED ORDER — AZITHROMYCIN 250 MG PO TABS: ORAL_TABLET | ORAL | 0 refills | Status: AC

## 2021-10-28 NOTE — Progress Notes (Signed)
Eric [name] , April 07, 1955, 66 y.o., male MRN: [id number] Patient Care Team    Relationship Specialty Notifications Start End  Ma Hillock, DO PCP - General Family Medicine  06/06/21   [facility] Consulting Physician Optometry  06/09/21     Chief Complaint  Patient presents with   Cough    Pt c/o coughing, wheezing, SOB, and hoarseness x 1 mo;      Subjective: Pt presents for an OV with complaints of dry cough, wheezing occasional short of breath and feeling hoarse for a little over a month.  He states he had a cold around a month ago that he believes he got through his grandson.  He did not feel he needs treated and the symptoms resolved rather quickly with the exception of the above symptoms mentioned. He has a history of exercise-induced asthma.  He admittedly did not think about using his albuterol inhaler until this morning.  He felt that he did not get the relief he normally would receive from the use of the albuterol.  He denies any fevers or chills.     09/09/2021    8:13 AM 06/06/2021    1:46 PM  Depression screen PHQ 2/9  Decreased Interest 0 0  Down, Depressed, Hopeless 0 0  PHQ - 2 Score 0 0    Allergies  Allergen Reactions   Gluten Meal Diarrhea and Other (See Comments)    GI   Social History   Social History Narrative   He is been married for 37 years.    Masters degree executive VP for [employer]   He is an avid exercise enthusiast - runs, bicycling and swimming at least 5 days a week for 40 minutes in time.   Has smoke alarm in the home   Wears a seatbelt.   Feels safe in his relationship.   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   Coronary CT Angiograpm  July-August 2018   1) Calcium score 147 which is 74th percentile for age and sex 2) Right dominant coronary arteries with Less than 30% calcified plaque in proximal LAD. Less than 50% calcified plaque in proximal RCA, Less than 30% soft plauqe in mid RCA and Less than 30% calcified plaque in distal RCA   [place]   TOE SURGERY  2010   TONSILLECTOMY  1962   VASECTOMY     Family History  Problem Relation Age of Onset   Hypertension Mother    Breast cancer Mother    Hearing loss Mother    Hyperlipidemia Father    Hypertension Father    Dementia Father    Heart disease Father    Prostate cancer Father  Heart attack Father        Paternal uncle also had MI   Depression Father    Obesity Father    Alcohol abuse Sister    Allergic rhinitis Brother    Hypertension Brother    Alcohol abuse Brother    Coronary artery disease Paternal Uncle    Heart attack Paternal Uncle    Heart attack Paternal Uncle    Coronary artery disease Paternal Uncle    Heart disease Paternal Grandfather    Angioedema Other    Eczema Other    Colon cancer Neg Hx    Colon polyps Neg Hx    Pancreatic cancer Neg Hx    Rectal cancer Neg Hx    Stomach cancer Neg Hx    Allergies as of 10/28/2021       Reactions   Gluten Meal Diarrhea, Other (See Comments)   GI        Medication List        Accurate as of October 28, 2021  2:04 PM. If you have any questions, ask your nurse or doctor.          albuterol 108 (90 Base) MCG/ACT inhaler Commonly known as: Ventolin HFA Inhale 2 puffs into the lungs every 4 (four) hours as needed for wheezing or  shortness of breath (coughing fits).   ASPIRIN 81 PO Take 1 tablet by mouth daily as needed.   atorvastatin 80 MG tablet Commonly known as: LIPITOR Take 1 tablet (80 mg total) by mouth at bedtime.   COD LIVER OIL PO Take by mouth.   Omeprazole 20 MG Tbec Take 40 mg by mouth.   TH MAGNESIUM PO Take by mouth.        All past medical history, surgical history, allergies, family history, immunizations andmedications were updated in the EMR today and reviewed under the history and medication portions of their EMR.     ROS Negative, with the exception of above mentioned in HPI   Objective:  BP 127/74   Pulse (!) 58   Temp 98.3 F (36.8 C) (Oral)   Ht 5\' 6"  (1.676 m)   Wt 153 lb (69.4 kg)   SpO2 99%   BMI 24.69 kg/m  Body mass index is 24.69 kg/m. Physical Exam Vitals and nursing note reviewed.  Constitutional:      General: He is not in acute distress.    Appearance: Normal appearance. He is not ill-appearing, toxic-appearing or diaphoretic.  HENT:     Head: Normocephalic and atraumatic.     Right Ear: Tympanic membrane normal.     Left Ear: Tympanic membrane normal.     Mouth/Throat:     Mouth: Mucous membranes are moist.  Eyes:     General: No scleral icterus.       Right eye: No discharge.        Left eye: No discharge.     Extraocular Movements: Extraocular movements intact.     Pupils: Pupils are equal, round, and reactive to light.  Cardiovascular:     Rate and Rhythm: Normal rate and regular rhythm.     Heart sounds: No murmur heard. Pulmonary:     Effort: Pulmonary effort is normal. No respiratory distress.     Breath sounds: Wheezing (Mild) present. No rhonchi or rales.  Musculoskeletal:     Cervical back: Neck supple.     Right lower leg: No edema.     Left lower leg: No edema.  Lymphadenopathy:  Cervical: No cervical adenopathy.  Skin:    General: Skin is warm and dry.     Coloration: Skin is not jaundiced or pale.     Findings: No rash.   Neurological:     Mental Status: He is alert and oriented to person, place, and time. Mental status is at baseline.  Psychiatric:        Mood and Affect: Mood normal.        Behavior: Behavior normal.        Thought Content: Thought content normal.        Judgment: Judgment normal.     No results found. No results found. No results found for this or any previous visit (from the past 24 hour(s)).  Assessment/Plan: KONSTANTIN LEHNEN is a 66 y.o. male present for OV for  Acute bronchitis with symptoms greater than 10 days/Mild intermittent asthma with exacerbation Rest, hydrate.  mucinex (DM if cough) Z-Pak and prednisone burst prescribed, take until completed.  Albuterol 1 to 2 puffs every 6 hours as needed during illness Follow-up in 2 weeks if not seeing improvement, sooner if worsening   Reviewed expectations re: course of current medical issues. Discussed self-management of symptoms. Outlined signs and symptoms indicating need for more acute intervention. Patient verbalized understanding and all questions were answered. Patient received an After-Visit Summary.    No orders of the defined types were placed in this encounter.  No orders of the defined types were placed in this encounter.  Referral Orders  No referral(s) requested today     Note is dictated utilizing voice recognition software. Although note has been proof read prior to signing, occasional typographical errors still can be missed. If any questions arise, please do not hesitate to call for verification.   electronically signed by:  Felix Pacini, DO  [facility] [place]

## 2021-10-28 NOTE — Patient Instructions (Signed)
  Acute Bronchitis, Adult  Acute bronchitis is when air tubes in the lungs (bronchi) suddenly get swollen. The condition can make it hard for you to breathe. In adults, acute bronchitis usually goes away within 2 weeks. A cough caused by bronchitis may last up to 3 weeks. Smoking, allergies, and asthma can make the condition worse. What are the causes? Germs that cause cold and flu (viruses). The most common cause of this condition is the virus that causes the common cold. Bacteria. Substances that bother (irritate) the lungs, including: Smoke from cigarettes and other types of tobacco. Dust and pollen. Fumes from chemicals, gases, or burned fuel. Indoor or outdoor air pollution. What increases the risk? A weak body's defense system. This is also called the immune system. Any condition that affects your lungs and breathing, such as asthma. What are the signs or symptoms? A cough. Coughing up clear, yellow, or green mucus. Making high-pitched whistling sounds when you breathe, most often when you breathe out (wheezing). Runny or stuffy nose. Having too much mucus in your lungs (chest congestion). Shortness of breath. Body aches. A sore throat. How is this treated? Acute bronchitis may go away over time without treatment. Your doctor may tell you to: Drink more fluids. This will help thin your mucus so it is easier to cough up. Use a device that gets medicine into your lungs (inhaler). Use a vaporizer or a humidifier. These are machines that add water to the air. This helps with coughing and poor breathing. Take a medicine that thins mucus and helps clear it from your lungs. Take a medicine that prevents or stops coughing. It is not common to take an antibiotic medicine for this condition. Follow these instructions at home:  Take over-the-counter and prescription medicines only as told by your doctor. Use an inhaler, vaporizer, or humidifier as told by your doctor. Take two  teaspoons (10 mL) of honey at bedtime. This helps lessen your coughing at night. Drink enough fluid to keep your pee (urine) pale yellow. Do not smoke or use any products that contain nicotine or tobacco. If you need help quitting, ask your doctor. Get a lot of rest. Return to your normal activities when your doctor says that it is safe. Keep all follow-up visits. How is this prevented?  Wash your hands often with soap and water for at least 20 seconds. If you cannot use soap and water, use hand sanitizer. Avoid contact with people who have cold symptoms. Try not to touch your mouth, nose, or eyes with your hands. Avoid breathing in smoke or chemical fumes. Make sure to get the flu shot every year. Contact a doctor if: Your symptoms do not get better in 2 weeks. You have trouble coughing up the mucus. Your cough keeps you awake at night. You have a fever. Get help right away if: You cough up blood. You have chest pain. You have very bad shortness of breath. You faint or keep feeling like you are going to faint. You have a very bad headache. Your fever or chills get worse. These symptoms may be an emergency. Get help right away. Call your local emergency services (911 in the [place] Do not wait to see if the symptoms will go away. Do not drive yourself to the hospital. Summary Acute bronchitis is when air tubes in the lungs (bronchi) suddenly get swollen. In adults, acute bronchitis usually goes away within 2 weeks. Drink more fluids. This will help thin your mucus so it   is easier to cough up. Take over-the-counter and prescription medicines only as told by your doctor. Contact a doctor if your symptoms do not improve after 2 weeks of treatment. This information is not intended to replace advice given to you by your health care provider. Make sure you discuss any questions you have with your health care provider. Document Revised: 05/22/2020 Document Reviewed: 05/22/2020 Elsevier  Patient Education  2023 [employer]  

## 2021-11-13 ENCOUNTER — Ambulatory Visit (INDEPENDENT_AMBULATORY_CARE_PROVIDER_SITE_OTHER): Payer: Medicare Other | Admitting: Family Medicine

## 2021-11-13 ENCOUNTER — Encounter: Payer: Self-pay | Admitting: Family Medicine

## 2021-11-13 VITALS — BP 131/73 | HR 63 | Temp 98.0°F | Ht 66.0 in | Wt 158.8 lb

## 2021-11-13 DIAGNOSIS — U071 COVID-19: Secondary | ICD-10-CM

## 2021-11-13 DIAGNOSIS — J069 Acute upper respiratory infection, unspecified: Secondary | ICD-10-CM

## 2021-11-13 DIAGNOSIS — I2510 Atherosclerotic heart disease of native coronary artery without angina pectoris: Secondary | ICD-10-CM | POA: Diagnosis not present

## 2021-11-13 MED ORDER — NIRMATRELVIR/RITONAVIR (PAXLOVID)TABLET: 3.0000 | ORAL_TABLET | Freq: Two times a day (BID) | ORAL | 0 refills | Status: AC

## 2021-11-13 NOTE — Progress Notes (Addendum)
OFFICE VISIT  11/13/2021  CC:  Chief Complaint  Patient presents with   Covid Positive    Tested positive yesterday. Current symptoms; wheezing, cough, chills, runny nose, and chest congestion. He has exercise-induced asthma but uses albuterol inhaler for relief yesterday and this morning which has helped. Has taken Advil which helps with chills.    Patient is a 66 y.o. male who presents for "covid positive".  HPI: Onset 2 days ago of fatigue, chills, nasal congestion/runny nose, cough.  Mild achiness. He has exercise-induced asthma.  He has had a little bit of wheezing with this illness, albuterol helps well. No nausea, vomiting, or diarrhea. No chest pain or shortness of breath. A COVID test at home was positive.  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   Coronary CT Angiograpm  July-August 2018   1) Calcium score 147 which is 74th percentile for age and sex 2) Right dominant coronary arteries with Less than 30% calcified plaque in proximal LAD. Less than 50% calcified plaque in proximal RCA, Less than 30% soft plauqe in mid RCA and Less than 30% calcified plaque in distal RCA   [place]   TOE SURGERY  2010   TONSILLECTOMY  1962   VASECTOMY      Outpatient Medications Prior to Visit  Medication Sig Dispense Refill   albuterol (VENTOLIN  HFA) 108 (90 Base) MCG/ACT inhaler Inhale 2 puffs into the lungs every 4 (four) hours as needed for wheezing or shortness of breath (coughing fits). 18 g 1   ASPIRIN 81 PO Take 1 tablet by mouth daily as needed.     atorvastatin (LIPITOR) 80 MG tablet Take 1 tablet (80 mg total) by mouth at bedtime. 90 tablet 3   COD LIVER OIL PO Take by mouth.     Omeprazole 20 MG TBEC Take 40 mg by mouth.     predniSONE (DELTASONE) 20 MG tablet Take 2 tablets (40 mg total) by mouth daily with breakfast. 10 tablet 0   TH MAGNESIUM PO Take by mouth.     No facility-administered medications prior to visit.    Allergies  Allergen Reactions   Gluten Meal Diarrhea and Other (See Comments)    GI    ROS As per HPI  PE:    11/13/2021   11:02 AM 10/28/2021    1:46 PM 10/23/2021   11:49 AM  Vitals with BMI  Height $Remov'5\' 6"'hGJxaW$  $Remove'5\' 6"'gCJGLrv$  $RemoveB'5\' 6"'btImkEbB$   Weight 158 lbs 13 oz 153 lbs 150 lbs 12 oz  BMI 25.64 47.09 62.83  Systolic 662 947 654  Diastolic 73 74 76  Pulse 63 58 87  02 sat 98% RA today  Physical Exam  VS: noted--normal. Gen: alert, NAD, NONTOXIC APPEARING. HEENT:  eyes without injection, drainage, or swelling.  Ears: EACs clear, TMs with normal light reflex and landmarks.  Nose: Clear rhinorrhea, with some dried, crusty exudate adherent to mildly injected mucosa.  No purulent d/c.  No paranasal sinus TTP.  No facial swelling.  Throat and mouth without focal lesion.  No pharyngial swelling, erythema, or exudate.   Neck: supple, no LAD.   LUNGS: CTA bilat, nonlabored resps.   CV: RRR, no m/r/g. EXT: no c/c/e SKIN: no rash   LABS:  Last metabolic panel Lab Results  Component Value Date   GLUCOSE 111 (H) 09/09/2021   NA 138 09/09/2021   K 4.6 09/09/2021   CL 101 09/09/2021   CO2 26 09/09/2021   BUN 26 (H) 09/09/2021   CREATININE 1.13 09/09/2021   EGFR 76 08/19/2020   CALCIUM 9.7 09/09/2021   PROT 7.3 09/09/2021   ALBUMIN 4.8 09/09/2021   BILITOT 0.7 09/09/2021   ALKPHOS 41 09/09/2021   AST 25  09/09/2021   ALT 24 09/09/2021    IMPRESSION AND PLAN:  COVID-19 respiratory infection (home covid test positive). Patient's risk factors for complications from COVID illness are: Age greater than 31 and exercise-induced asthma. Paxlovid eRx'd. Cont albuterol 2p q4h prn. OTC cough/cold med ok. Quarantine precautions reviewed.  An After Visit Summary was printed and given to the patient.  FOLLOW UP: Return if symptoms worsen or fail to improve.  Signed:  Crissie Sickles, MD           11/13/2021

## 2021-11-24 ENCOUNTER — Telehealth (INDEPENDENT_AMBULATORY_CARE_PROVIDER_SITE_OTHER): Payer: Medicare Other | Admitting: Family Medicine

## 2021-11-24 ENCOUNTER — Encounter: Payer: Self-pay | Admitting: Family Medicine

## 2021-11-24 VITALS — BP 143/86 | Temp 98.0°F

## 2021-11-24 DIAGNOSIS — U071 COVID-19: Secondary | ICD-10-CM

## 2021-11-24 DIAGNOSIS — J40 Bronchitis, not specified as acute or chronic: Secondary | ICD-10-CM

## 2021-11-24 MED ORDER — PREDNISONE 50 MG PO TABS: ORAL_TABLET | ORAL | 0 refills | Status: DC

## 2021-11-24 MED ORDER — BUDESONIDE 90 MCG/ACT IN AEPB: 1.0000 | INHALATION_SPRAY | Freq: Two times a day (BID) | RESPIRATORY_TRACT | 0 refills | Status: DC

## 2021-11-24 MED ORDER — AZITHROMYCIN 250 MG PO TABS: ORAL_TABLET | ORAL | 0 refills | Status: AC

## 2021-11-24 NOTE — Progress Notes (Signed)
VIRTUAL VISIT VIA VIDEO  I connected with Eric [name] on 11/25/21 at  4:00 PM EDT by a video enabled telemedicine application and verified that I am speaking with the correct person using two identifiers. Location patient: Home Location provider: [facility], Office Persons participating in the virtual visit: Patient, Dr. Claiborne Billings and Les Pou, CMA  I discussed the limitations of evaluation and management by telemedicine and the availability of in person appointments. The patient expressed understanding and agreed to proceed.     MERRIL NAGY , 08-08-1955, 66 y.o., male MRN: [id number] Patient Care Team    Relationship Specialty Notifications Start End  Natalia Leatherwood, DO PCP - General Family Medicine  06/06/21   [facility] Consulting Physician Optometry  06/09/21     Chief Complaint  Patient presents with   Covid Positive    Tested negative 10/11 paxlovid Tested positive 10/21  Using albuterol inhaler more frequently     Subjective: Eric [name] is a 66 y.o. male present virtually today to discuss his continued symptoms status post COVID.  He was treated with Paxlovid and feels like he is having COVID rebound.  He is using his inhaler a few times a day, which is unusual for him.  Denies fevers, chills, nausea or vomit.  He is having some sinus symptoms.  Prior note: Pt presents for an OV with complaints of dry cough, wheezing occasional short of breath and feeling hoarse for a little over a month.  He states he had a cold around a month ago that he believes he got through his grandson.  He did not feel he needs treated and the symptoms resolved rather quickly with the exception of the above symptoms mentioned. He has a history of exercise-induced asthma.  He admittedly did not think about using his albuterol inhaler until this morning.  He felt that he did not get the relief he normally would receive from the use of the albuterol.  He denies any fevers or  chills. Had paxlovid     09/09/2021    8:13 AM 06/06/2021    1:46 PM  Depression screen PHQ 2/9  Decreased Interest 0 0  Down, Depressed, Hopeless 0 0  PHQ - 2 Score 0 0    Allergies  Allergen Reactions   Gluten Meal Diarrhea and Other (See Comments)    GI   Social History   Social History Narrative   He is been married for 37 years.    Masters degree executive VP for [employer]   He is an avid exercise enthusiast - runs, bicycling and swimming at least 5 days a week for 40 minutes in time.   Has smoke alarm in the home   Wears a seatbelt.   Feels safe in his relationship.   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   Coronary CT Angiograpm  July-August 2018   1) Calcium score 147 which is 74th percentile for age and sex 2) Right dominant coronary arteries with Less than 30% calcified plaque in proximal LAD. Less than 50% calcified plaque in proximal RCA, Less than 30% soft plauqe in mid RCA and Less than 30% calcified plaque in distal RCA   [place]   TOE SURGERY  2010   TONSILLECTOMY  1962   VASECTOMY     Family History  Problem Relation Age of Onset   Hypertension Mother    Breast cancer Mother    Hearing loss Mother    Hyperlipidemia Father    Hypertension Father    Dementia Father    Heart disease Father    Prostate cancer  Father    Heart attack Father        Paternal uncle also had MI   Depression Father    Obesity Father    Alcohol abuse Sister    Allergic rhinitis Brother    Hypertension Brother    Alcohol abuse Brother    Coronary artery disease Paternal Uncle    Heart attack Paternal Uncle    Heart attack Paternal Uncle    Coronary artery disease Paternal Uncle    Heart disease Paternal Grandfather    Angioedema Other    Eczema Other    Colon cancer Neg Hx    Colon polyps Neg Hx    Pancreatic cancer Neg Hx    Rectal cancer Neg Hx    Stomach cancer Neg Hx    Allergies as of 11/24/2021       Reactions   Gluten Meal Diarrhea, Other (See Comments)   GI        Medication List        Accurate as of November 24, 2021  4:14 PM. If you have any questions, ask your nurse or doctor.          STOP taking these medications    predniSONE 20 MG tablet Commonly known as: DELTASONE Stopped by: Howard Pouch, DO       TAKE these medications    albuterol 108 (90 Base) MCG/ACT inhaler Commonly known as: Ventolin HFA Inhale 2 puffs into the lungs every 4 (four) hours as needed for wheezing or shortness of breath (coughing fits).   ASPIRIN 81 PO Take 1 tablet by mouth daily as needed.   atorvastatin 80 MG tablet Commonly known as: LIPITOR Take 1 tablet (80 mg total) by mouth at bedtime.   COD LIVER OIL PO Take by mouth.   Omeprazole 20 MG Tbec Take 40 mg by mouth.   TH MAGNESIUM PO Take by mouth.        All past medical history, surgical history, allergies, family history, immunizations andmedications were updated in the EMR today and reviewed under the history and medication portions of their EMR.     Review of Systems  Constitutional:  Positive for malaise/fatigue. Negative for chills and fever.  HENT:  Positive for sinus pain.   Respiratory:  Positive for cough, sputum production and wheezing.    Negative, with the exception of above mentioned in HPI   Objective:   BP (!) 143/86   Temp 98 F (36.7 C)   SpO2 95%  There is no height or weight on file to calculate BMI. Physical Exam  Vitals and nursing note reviewed.  Constitutional:      General: He is not in acute distress.    Appearance: Normal appearance. He is not ill-appearing, toxic-appearing or diaphoretic.  HENT:     Head: Normocephalic and atraumatic.  Eyes:     General: No scleral icterus.       Right eye: No discharge.        Left eye: No discharge.     Extraocular Movements: Extraocular movements intact.     Pupils: Pupils are equal, round, and reactive to light.  Pulmonary:     Effort: Pulmonary effort is normal.  Skin:    General: Skin is warm and dry.     Coloration: Skin is not jaundiced or pale.     Findings: No rash.  Neurological:     Mental Status: He is alert and oriented to person, place, and time. Mental status is at baseline.  Psychiatric:        Mood and Affect: Mood normal.        Behavior: Behavior normal.        Thought Content: Thought content normal.        Judgment: Judgment normal.     No results found. No results found. No results found for this or any previous visit (from the past 24 hour(s)).  Assessment/Plan: IHOR MEINZER is a 66 y.o. male present for OV for  Bronchitis status post COVID Rest, hydrate.  mucinex (DM if cough) Z-Pak and prednisone burst prescribed, take until completed.  Albuterol 1 to 2 puffs every 6 hours as needed during illness Flovent inhaler prescribed use 2 to 4 weeks Follow-up in 2 weeks if not seeing improvement, sooner if worsening   Reviewed expectations re: course of current medical issues. Discussed self-management of symptoms. Outlined signs and symptoms indicating need for more acute intervention. Patient verbalized understanding and all questions were answered. Patient received an After-Visit Summary.    No orders of the defined types were placed in this encounter.  No orders of the defined types were  placed in this encounter.  Referral Orders  No referral(s) requested today     Note is dictated utilizing voice recognition software. Although note has been proof read prior to signing, occasional typographical errors still can be missed. If any questions arise, please do not hesitate to call for verification.   electronically signed by:  Felix Pacini, DO  [facility] [place]

## 2021-11-25 ENCOUNTER — Encounter: Payer: Self-pay | Admitting: Family Medicine

## 2021-11-25 ENCOUNTER — Telehealth: Payer: Self-pay

## 2021-11-25 MED ORDER — FLUTICASONE PROPIONATE HFA 110 MCG/ACT IN AERO: 2.0000 | INHALATION_SPRAY | Freq: Every day | RESPIRATORY_TRACT | 0 refills | Status: DC

## 2021-11-25 NOTE — Telephone Encounter (Signed)
Spoke with patient regarding results/recommendations.  

## 2021-11-25 NOTE — Addendum Note (Signed)
Addended by: Howard Pouch A on: 11/25/2021 12:12 PM   Modules accepted: Orders

## 2021-11-25 NOTE — Telephone Encounter (Signed)
Called in Flovent in place of budesonide.   This is not available or not on his formulary, then please ask patient/pharmacy what is on their formulary and available? Thanks

## 2021-11-25 NOTE — Telephone Encounter (Signed)
Written notice received from the pharmacy regarding Budesonide, this is not available currently.  Alternative requested. Please advise.

## 2021-11-26 ENCOUNTER — Telehealth: Payer: Self-pay | Admitting: Family Medicine

## 2021-11-26 NOTE — Telephone Encounter (Signed)
Pt called back and said disregard previous message about medication advair.

## 2021-11-26 NOTE — Telephone Encounter (Signed)
Noted  

## 2021-11-26 NOTE — Telephone Encounter (Signed)
Pt is requesting he have a generic of advair called in. He is concerned with the cost of the medication and would like a call on how he should proceed with this process. Please call patient with additional steps.

## 2021-12-04 ENCOUNTER — Encounter: Payer: Self-pay | Admitting: Family Medicine

## 2021-12-08 ENCOUNTER — Ambulatory Visit (INDEPENDENT_AMBULATORY_CARE_PROVIDER_SITE_OTHER): Payer: Medicare Other | Admitting: Family Medicine

## 2021-12-08 ENCOUNTER — Encounter: Payer: Self-pay | Admitting: Family Medicine

## 2021-12-08 ENCOUNTER — Ambulatory Visit (HOSPITAL_BASED_OUTPATIENT_CLINIC_OR_DEPARTMENT_OTHER)
Admission: RE | Admit: 2021-12-08 | Discharge: 2021-12-08 | Disposition: A | Discharge status: Home / Self Care | Payer: Medicare Other | Source: Ambulatory Visit | Attending: Family Medicine | Admitting: Family Medicine

## 2021-12-08 VITALS — BP 133/89 | HR 73 | Temp 97.8°F | Wt 152.2 lb

## 2021-12-08 DIAGNOSIS — R052 Subacute cough: Secondary | ICD-10-CM | POA: Diagnosis present

## 2021-12-08 DIAGNOSIS — J40 Bronchitis, not specified as acute or chronic: Secondary | ICD-10-CM

## 2021-12-08 DIAGNOSIS — R062 Wheezing: Secondary | ICD-10-CM | POA: Diagnosis not present

## 2021-12-08 DIAGNOSIS — J4521 Mild intermittent asthma with (acute) exacerbation: Secondary | ICD-10-CM

## 2021-12-08 DIAGNOSIS — U071 COVID-19: Secondary | ICD-10-CM

## 2021-12-08 MED ORDER — CETIRIZINE HCL 10 MG PO TABS: 10.0000 mg | ORAL_TABLET | Freq: Every day | ORAL | 11 refills | Status: DC

## 2021-12-08 MED ORDER — ALBUTEROL SULFATE HFA 108 (90 BASE) MCG/ACT IN AERS: 2.0000 | INHALATION_SPRAY | RESPIRATORY_TRACT | 1 refills | Status: DC | PRN

## 2021-12-08 NOTE — Progress Notes (Unsigned)
AMIIR HECKARD , [birth year], 66 y.o., male MRN: [id number] Patient Care Team    Relationship Specialty Notifications Start End  Natalia Leatherwood, DO PCP - General Family Medicine  06/06/21   [facility] Consulting Physician Optometry  06/09/21     Chief Complaint  Patient presents with   Cough    States got better but is now getting worse     Subjective: Eric [name] is a 66 y.o. male present for his  4th visit for s/p covid bronchitis. Tx with paxlovid, albuterol, steroid x2, flovent start, azith x2.  He is present today  d/t bronchitis symptoms reported are worse and "needs more complete plan for bronchitis assessment and recovery." Prior note: virtually today to discuss his continued symptoms status post COVID.  He was treated with Paxlovid and feels like he is having COVID rebound.  He is using his inhaler a few times a day, which is unusual for him.  Denies fevers, chills, nausea or vomit.  He is having some sinus symptoms.  Prior note: Pt presents for an OV with complaints of dry cough, wheezing occasional short of breath and feeling hoarse for a little over a month.  He states he had a cold around a month ago that he believes he got through his grandson.  He did not feel he needs treated and the symptoms resolved rather quickly with the exception of the above symptoms mentioned. He has a history of exercise-induced asthma.  He admittedly did not think about using his albuterol inhaler until this morning.  He felt that he did not get the relief he normally would receive from the use of the albuterol.  He denies any fevers or chills. Had paxlovid     09/09/2021    8:13 AM 06/06/2021    1:46 PM  Depression screen PHQ 2/9  Decreased Interest 0 0  Down, Depressed, Hopeless 0 0  PHQ - 2 Score 0 0    Allergies  Allergen Reactions   Gluten Meal Diarrhea and Other (See Comments)    GI   Social History   Social History Narrative   He is been married for 37 years.    Masters  degree executive VP for [employer]   He is an avid exercise enthusiast - runs, bicycling and swimming at least 5 days a week for 40 minutes in time.   Has smoke alarm in the home   Wears a seatbelt.   Feels safe in his relationship.   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   Coronary CT Angiograpm  July-August 2018   1) Calcium score 147 which is 74th percentile for age and sex 2) Right dominant coronary arteries with Less  than 30% calcified plaque in proximal LAD. Less than 50% calcified plaque in proximal RCA, Less than 30% soft plauqe in mid RCA and Less than 30% calcified plaque in distal RCA   [place]   TOE SURGERY  2010   TONSILLECTOMY  1962   VASECTOMY     Family History  Problem Relation Age of Onset   Hypertension Mother    Breast cancer Mother    Hearing loss Mother    Hyperlipidemia Father    Hypertension Father    Dementia Father    Heart disease Father    Prostate cancer Father    Heart attack Father        Paternal uncle also had MI   Depression Father    Obesity Father    Alcohol abuse Sister    Allergic rhinitis Brother    Hypertension Brother    Alcohol abuse Brother    Coronary artery disease Paternal Uncle    Heart attack Paternal Uncle    Heart attack Paternal Uncle    Coronary artery disease Paternal Uncle     Heart disease Paternal Grandfather    Angioedema Other    Eczema Other    Colon cancer Neg Hx    Colon polyps Neg Hx    Pancreatic cancer Neg Hx    Rectal cancer Neg Hx    Stomach cancer Neg Hx    Allergies as of 12/08/2021       Reactions   Gluten Meal Diarrhea, Other (See Comments)   GI        Medication List        Accurate as of December 08, 2021  2:28 PM. If you have any questions, ask your nurse or doctor.          STOP taking these medications    predniSONE 50 MG tablet Commonly known as: DELTASONE Stopped by: Howard Pouch, DO       TAKE these medications    albuterol 108 (90 Base) MCG/ACT inhaler Commonly known as: Ventolin HFA Inhale 2 puffs into the lungs every 4 (four) hours as needed for wheezing or shortness of breath (coughing fits).   ASPIRIN 81 PO Take 1 tablet by mouth daily as needed.   atorvastatin 80 MG tablet Commonly known as: LIPITOR Take 1 tablet (80 mg total) by mouth at bedtime.   cetirizine 10 MG tablet Commonly known as: ZYRTEC Take 1 tablet (10 mg total) by mouth at bedtime. Started by: Howard Pouch, DO   COD LIVER OIL PO Take by mouth.   fluticasone 110 MCG/ACT inhaler Commonly known as: Flovent HFA Inhale 2 puffs into the lungs daily.   Omeprazole 20 MG Tbec Take 40 mg by mouth.   TH MAGNESIUM PO Take by mouth.        All past medical history, surgical history, allergies, family history, immunizations andmedications were updated in the EMR today and reviewed under the history and medication portions of their EMR.     Review of Systems  Constitutional:  Negative for chills, fever and malaise/fatigue.  HENT:  Negative for sinus pain.   Respiratory:  Negative for cough, sputum production and wheezing.    Negative, with the exception of above mentioned in HPI   Objective:  BP 133/89   Pulse 73   Temp 97.8 F (36.6 C)   Wt 152 lb 3.2 oz (69 kg)   SpO2 97%   BMI 24.57 kg/m  Body mass index is 24.57  kg/m.  Physical Exam Vitals and nursing note reviewed.  Constitutional:      General: He is not in acute distress.    Appearance: Normal appearance. He is not ill-appearing, toxic-appearing or diaphoretic.  HENT:     Head: Normocephalic and atraumatic.     Mouth/Throat:     Mouth: Mucous membranes are moist.  Eyes:     General: No scleral icterus.       Right eye: No discharge.        Left eye: No discharge.     Extraocular Movements: Extraocular movements intact.     Pupils: Pupils are equal, round, and reactive to light.  Cardiovascular:     Rate and Rhythm: Normal rate and regular rhythm.  Pulmonary:     Effort: Pulmonary effort is normal. No respiratory distress.     Breath sounds: Normal breath sounds. No wheezing, rhonchi or rales.  Musculoskeletal:     Cervical back: Neck supple.  Lymphadenopathy:     Cervical: No cervical adenopathy.  Skin:    General: Skin is warm and dry.     Coloration: Skin is not jaundiced or pale.     Findings: No rash.  Neurological:     Mental Status: He is alert and oriented to person, place, and time. Mental status is at baseline.  Psychiatric:        Mood and Affect: Mood normal.        Behavior: Behavior normal.        Thought Content: Thought content normal.        Judgment: Judgment normal.     No results found. No results found. No results found for this or any previous visit (from the past 24 hour(s)).  Assessment/Plan: JAHQUEZ STEFFLER is a 66 y.o. male present for OV for  Bronchitis status post COVID Extended s/p covid course after rebound to paxlovid.  Increase Flovent 2 puffs BID (was only taking Qd) Albuterol every 6 hours recommended until complete resolution of illness.  Start zyrtec nightly-prescribed CBC collected today (pt aware mild increase in WBC is expected with steroid use) Cxr > if PNA present, will prescribe abx . referral to pulm placed if normal cxr/albs, for further evaluation.   Reviewed expectations re:  course of current medical issues. Discussed self-management of symptoms. Outlined signs and symptoms indicating need for more acute intervention. Patient verbalized understanding and all questions were answered. Patient received an After-Visit Summary.    Orders Placed This Encounter  Procedures   DG Chest 2 View   CBC w/Diff   Meds ordered this encounter  Medications   cetirizine (ZYRTEC) 10 MG tablet    Sig: Take 1 tablet (10 mg total) by mouth at bedtime.    Dispense:  30 tablet    Refill:  11   Referral Orders  No referral(s) requested today     Note is dictated utilizing voice recognition software. Although note has been proof read prior to signing, occasional typographical errors still can be missed. If any questions arise, please do not hesitate to call for verification.   electronically signed by:  Felix Pacini, DO  [facility] [place]

## 2021-12-09 ENCOUNTER — Telehealth: Payer: Self-pay | Admitting: Family Medicine

## 2021-12-09 DIAGNOSIS — D72110 Idiopathic hypereosinophilic syndrome (ihes): Secondary | ICD-10-CM

## 2021-12-09 DIAGNOSIS — J40 Bronchitis, not specified as acute or chronic: Secondary | ICD-10-CM | POA: Insufficient documentation

## 2021-12-09 DIAGNOSIS — J45909 Unspecified asthma, uncomplicated: Secondary | ICD-10-CM | POA: Insufficient documentation

## 2021-12-09 HISTORY — DX: Bronchitis, not specified as acute or chronic: J40

## 2021-12-09 LAB — CBC WITH DIFFERENTIAL/PLATELET
Basophils Absolute: 0.1 10*3/uL (ref 0.0–0.1)
Basophils Relative: 1 % (ref 0.0–3.0)
Eosinophils Absolute: 3.3 10*3/uL — ABNORMAL HIGH (ref 0.0–0.7)
Eosinophils Relative: 29.9 % — ABNORMAL HIGH (ref 0.0–5.0)
HCT: 47.2 % (ref 39.0–52.0)
Hemoglobin: 15.5 g/dL (ref 13.0–17.0)
Lymphocytes Relative: 23.8 % (ref 12.0–46.0)
Lymphs Abs: 2.6 10*3/uL (ref 0.7–4.0)
MCHC: 32.9 g/dL (ref 30.0–36.0)
MCV: 89.5 fl (ref 78.0–100.0)
Monocytes Absolute: 0.8 10*3/uL (ref 0.1–1.0)
Monocytes Relative: 7.2 % (ref 3.0–12.0)
Neutro Abs: 4.2 10*3/uL (ref 1.4–7.7)
Neutrophils Relative %: 38.1 % — ABNORMAL LOW (ref 43.0–77.0)
Platelets: 281 10*3/uL (ref 150.0–400.0)
RBC: 5.28 Mil/uL (ref 4.22–5.81)
RDW: 14.8 % (ref 11.5–15.5)
WBC: 10.9 10*3/uL — ABNORMAL HIGH (ref 4.0–10.5)

## 2021-12-09 MED ORDER — IPRATROPIUM-ALBUTEROL 0.5-2.5 (3) MG/3ML IN SOLN: 3.0000 mL | Freq: Once | RESPIRATORY_TRACT | Status: DC

## 2021-12-09 MED ORDER — MONTELUKAST SODIUM 10 MG PO TABS: 10.0000 mg | ORAL_TABLET | Freq: Every day | ORAL | 3 refills | Status: DC

## 2021-12-09 MED ORDER — PREDNISONE 20 MG PO TABS: ORAL_TABLET | ORAL | 0 refills | Status: DC

## 2021-12-09 MED ORDER — IPRATROPIUM-ALBUTEROL 0.5-2.5 (3) MG/3ML IN SOLN
3.0000 mL | Freq: Once | RESPIRATORY_TRACT | Status: AC
  Administered 2021-12-08: 3 mL via RESPIRATORY_TRACT

## 2021-12-09 NOTE — Telephone Encounter (Signed)
Please call patient: I have not received the reading on his chest x-ray. His lab work has just returned and does not show an elevation in the white blood cells that would account for bacterial infection.  However his eosinophils are extremely high.  Eosinophils are high when asthma/allergies and acute inflammatory reactions are occurring.  Of course we will call him once the chest x-ray results are also in, but for now I have called in a 10-day taper course of prednisone.  Eosinophils respond very well to prednisone, and is likely why he only felt good when he was on it. Continue the antihistamine we started before bed and I also called in a another medication that helps decrease the inflammatory cascade,called Singulair, which is taken before bed.   Lastly, I have referred him to pulmonology for them to further evaluate because of such a high eosinophilic response.

## 2021-12-09 NOTE — Telephone Encounter (Signed)
Spoke with patient regarding results/recommendations.  

## 2021-12-09 NOTE — Addendum Note (Signed)
Addended by: Beatrix Fetters on: 12/09/2021 04:52 PM   Modules accepted: Orders

## 2021-12-10 ENCOUNTER — Encounter: Payer: Self-pay | Admitting: Allergy

## 2021-12-10 ENCOUNTER — Ambulatory Visit (INDEPENDENT_AMBULATORY_CARE_PROVIDER_SITE_OTHER): Payer: Medicare Other | Admitting: Allergy

## 2021-12-10 ENCOUNTER — Encounter: Payer: Self-pay | Admitting: Family Medicine

## 2021-12-10 VITALS — BP 130/70 | HR 83 | Temp 98.1°F | Resp 20 | Wt 150.5 lb

## 2021-12-10 DIAGNOSIS — Z713 Dietary counseling and surveillance: Secondary | ICD-10-CM

## 2021-12-10 DIAGNOSIS — J310 Chronic rhinitis: Secondary | ICD-10-CM

## 2021-12-10 DIAGNOSIS — D7219 Other eosinophilia: Secondary | ICD-10-CM | POA: Diagnosis not present

## 2021-12-10 DIAGNOSIS — J4541 Moderate persistent asthma with (acute) exacerbation: Secondary | ICD-10-CM | POA: Insufficient documentation

## 2021-12-10 DIAGNOSIS — J4540 Moderate persistent asthma, uncomplicated: Secondary | ICD-10-CM

## 2021-12-10 DIAGNOSIS — J45990 Exercise induced bronchospasm: Secondary | ICD-10-CM

## 2021-12-10 MED ORDER — LEVALBUTEROL HCL 1.25 MG/3ML IN NEBU: 1.2500 mg | INHALATION_SOLUTION | RESPIRATORY_TRACT | 1 refills | Status: DC | PRN

## 2021-12-10 MED ORDER — AEROCHAMBER MV MISC: 2 refills | Status: DC

## 2021-12-10 MED ORDER — LEVALBUTEROL TARTRATE 45 MCG/ACT IN AERO: 2.0000 | INHALATION_SPRAY | RESPIRATORY_TRACT | 2 refills | Status: DC | PRN

## 2021-12-10 MED ORDER — BUDESONIDE-FORMOTEROL FUMARATE 160-4.5 MCG/ACT IN AERO: 2.0000 | INHALATION_SPRAY | Freq: Two times a day (BID) | RESPIRATORY_TRACT | 3 refills | Status: DC

## 2021-12-10 MED ORDER — BENZONATATE 100 MG PO CAPS: 100.0000 mg | ORAL_CAPSULE | Freq: Two times a day (BID) | ORAL | 0 refills | Status: DC | PRN

## 2021-12-10 NOTE — Assessment & Plan Note (Signed)
Past history - Some mild symptoms in the spring and fall. Tried zyrtec with no benefit. Skin testing in 2006 was positive to mold per patient. No prior AIT. 2023 skin prick testing was negative to indoor/outdoor allergens. Use Flonase (fluticasone) or Nasacort nasal spray 1 spray per nostril twice a day as needed for nasal congestion.

## 2021-12-10 NOTE — Patient Instructions (Addendum)
Breathing Finish prednisone. You may take tessalon perles as needed. Stop cetirizine. Stop NSAIDs. Keep pulmonology appointment.  Daily controller medication(s): start Symbicort 2 puffs twice a day with spacer and rinse mouth afterwards. Spacer - demonstrated proper use with inhaler. Patient understood technique and all questions/concerned were addressed.  Nebulizer machine given.  Stop Flovent. Continue Singulair (montelukast) 10mg  daily at night. During upper respiratory infections/flares:  Pretreat with levoalbuterol 2 puffs or albuterol nebulizer.  If you need to use your levoalbuterol nebulizer machine back to back within 15-30 minutes with no relief then please go to the ER/urgent care for further evaluation.  May use levoalbuterol rescue inhaler 2 puffs or nebulizer every 4 to 6 hours as needed for shortness of breath, chest tightness, coughing, and wheezing. May use levoalbuterol rescue inhaler 2 puffs 5 to 15 minutes prior to strenuous physical activities. Monitor frequency of use.  breathing control goals:  Full participation in all desired activities (may need albuterol before activity) Albuterol use two times or less a week on average (not counting use with activity) Cough interfering with sleep two times or less a month Oral steroids no more than once a year No hospitalizations   Eosinophils Will need to recheck in 1-2 weeks. If still elevated will need further work up.  Rhinitis 2023 skin testing was negative to indoor/outdoor allergens.  Use Flonase (fluticasone) or Nasacort nasal spray 1 spray per nostril twice a day as needed for nasal congestion.   Food Keep a food journal with symptoms and foods eaten. Recommend to reintroduce foods one by one a few days apart and see if you notice any symptoms. Start with a few nuts at a time.  If no issues then okay to consume. If you notice issues then continue to avoid.   Follow up in 2 weeks or sooner if needed.

## 2021-12-10 NOTE — Assessment & Plan Note (Signed)
Past history - Avoiding gluten due to gluten intolerance - improved eczema and GI symptoms with avoidance. Noted brain fog and headaches with legumes/tree nut ingestion.  Interim history - no issues with legumes.  Recommend to reintroduce foods one by one a few days apart and see if you notice any symptoms. Start with a few nuts at a time.  If no issues then okay to consume. If you notice issues then continue to avoid.

## 2021-12-10 NOTE — Assessment & Plan Note (Addendum)
Past history - Noted shortness of breath with swimming and first mile running. Sometimes has coughing and wheezing at the end. Runs 6 miles consistently. Denies any prior asthma diagnosis or inhaler use. 2023 spirometry was normal. Interim history - was doing well up until he had bronchitis and Covid-19. Had 2 rounds of zpak and 3 rounds of prednisone which helps but then symptoms flare when off medications. Recent CXR showed bronchitis and cbc diff showed elevated eos. No prior history of eosinophilia. Having some shaking and palpitations recently due to increased albuterol use.  Today's spirometry showed: restrictive disease with 76% improvement in FEV1 post bronchodilator treatment. Clinically feeling improved.  Finish prednisone. You may take tessalon perles as needed. Stop cetirizine. Stop NSAIDs. Keep pulmonology appointment. Daily controller medication(s): start Symbicort 2 puffs twice a day with spacer and rinse mouth afterwards. Spacer - demonstrated proper use with inhaler. Patient understood technique and all questions/concerned were addressed.  Nebulizer machine given.  Stop Flovent. Continue Singulair (montelukast) 10mg  daily at night. During upper respiratory infections/flares:  Pretreat with levoalbuterol 2 puffs or levoalbuterol nebulizer.  If you need to use your levoalbuterol nebulizer machine back to back within 15-30 minutes with no relief then please go to the ER/urgent care for further evaluation.  May use levoalbuterol rescue inhaler 2 puffs or nebulizer every 4 to 6 hours as needed for shortness of breath, chest tightness, coughing, and wheezing. May use levoalbuterol rescue inhaler 2 puffs 5 to 15 minutes prior to strenuous physical activities. Monitor frequency of use.  Advised patient to hold off any strenuous activity until follow up in 2 weeks.

## 2021-12-10 NOTE — Assessment & Plan Note (Signed)
12/08/2021 cbc diff showed eos of 3300. No recent eos to compare to. Last eos in 2017 was 230. Patient had about 3lbs weight loss the last 1-2 months but also not eating as much and exercising less as he has not been feeling well with his bouts of bronchitis and Covid-19. Also taking more NSAIDs.  Need to recheck CBC diff in 1-2 weeks to see how the eos are trending. Patient is on prednisone now and has appointment with pulmonology next week. If eos still persistently elevated then will need further work up. Could be also elevated due to recent Covid-19 infection.

## 2021-12-10 NOTE — Telephone Encounter (Signed)
No further action needed.

## 2021-12-10 NOTE — Assessment & Plan Note (Signed)
>>  ASSESSMENT AND PLAN FOR MODERATE PERSISTENT ASTHMA WITH ACUTE EXACERBATION WRITTEN ON 12/10/2021  6:09 PM BY Ellamae Sia, DO  Past history - Noted shortness of breath with swimming and first mile running. Sometimes has coughing and wheezing at the end. Runs 6 miles consistently. Denies any prior asthma diagnosis or inhaler use. 2023 spirometry was normal. Interim history - was doing well up until he had bronchitis and Covid-19. Had 2 rounds of zpak and 3 rounds of prednisone which helps but then symptoms flare when off medications. Recent CXR showed bronchitis and cbc diff showed elevated eos. No prior history of eosinophilia. Having some shaking and palpitations recently due to increased albuterol use.  Today's spirometry showed: restrictive disease with 76% improvement in FEV1 post bronchodilator treatment. Clinically feeling improved.  Finish prednisone. You may take tessalon perles as needed. Stop cetirizine. Stop NSAIDs. Keep pulmonology appointment. Daily controller medication(s): start Symbicort 2 puffs twice a day with spacer and rinse mouth afterwards. Spacer - demonstrated proper use with inhaler. Patient understood technique and all questions/concerned were addressed.  Nebulizer machine given.  Stop Flovent. Continue Singulair (montelukast) 10mg  daily at night. During upper respiratory infections/flares:  Pretreat with levoalbuterol 2 puffs or levoalbuterol nebulizer.  If you need to use your levoalbuterol nebulizer machine back to back within 15-30 minutes with no relief then please go to the ER/urgent care for further evaluation.  May use levoalbuterol rescue inhaler 2 puffs or nebulizer every 4 to 6 hours as needed for shortness of breath, chest tightness, coughing, and wheezing. May use levoalbuterol rescue inhaler 2 puffs 5 to 15 minutes prior to strenuous physical activities. Monitor frequency of use.  Advised patient to hold off any strenuous activity until follow up in 2  weeks.

## 2021-12-10 NOTE — Progress Notes (Signed)
Follow Up Note  RE: [name] MRN: [id number] DOB: 1955-08-19 Date of Office Visit: 12/10/2021  Referring provider: Natalia Leatherwood, DO Primary care provider: Natalia Leatherwood, DO  Chief Complaint: Other (Asthmatic bronchitis on 4th flare up. )  History of Present Illness: I had the pleasure of seeing Eric [name] for a follow up visit at the [facility] on 12/11/2021. He is a 66 y.o. male, who is being followed for asthma, chronic rhinitis. His previous allergy office visit was on 10/23/2021 with Dr. Selena Batten. Today is a new complaint visit of coughing .  He is accompanied today by his wife who provided/contributed to the history.   Patient has been having issues with his breathing the past 2 months. On 10/28/2021 he was diagnosed with acute bronchitis which was treated with zpak and prednisone with good benefit.  Then he went on a trip and got Covid-19 on 11/13/2021. He was treated with Paxlovid which helped.  Then on 11/24/2021 he had another flare of his breathing and was treated with zpak and prednisone again which helped but then symptoms returned.  He had another PCP visit on 12/08/2021 and cbc diff showed highly elevated eos (3300) - unfortunately no prior values to compare this to.  He also had a CXR which showed signs of bronchitis.   He had fevers with Covid-19.  Patient had nebulizer machine treatment in the office at PCP's which helped. He was also prescribed zyrtec, montelukast and Flovent at the last visit. He has pulmonology appointment next week.  Decreased wine intake and lost about 3lbs the past 1-2 months. Tried beans with no issues.  He had his cholesterol medications changed 2 months ago and noticing some heartburn. He is also taking OTC Nsaids for his chest issues. Takes a baby aspirin as preventative measures.   Noted shaking and palpitations with albuterol.  12/08/2021 CXR: "MPRESSION: 1. Central bronchial thickening consistent with  bronchitis. No consolidation or collapse. 2. Aortic atherosclerosis."  Component     Latest Ref Rng 12/08/2021  WBC     4.0 - 10.5 K/uL 10.9 (H)   RBC     4.22 - 5.81 Mil/uL 5.28   Platelets     150.0 - 400.0 K/uL 281.0   Hemoglobin     13.0 - 17.0 g/dL 84.1   HCT     66.0 - 63.0 % 47.2   MCV     78.0 - 100.0 fl 89.5   MCHC     30.0 - 36.0 g/dL 16.0   RDW     10.9 - 32.3 % 14.8   Neutrophils     43.0 - 77.0 % 38.1 (L)   Lymphocytes     12.0 - 46.0 % 23.8   Monocytes Relative     3.0 - 12.0 % 7.2   Eosinophil     0.0 - 5.0 % 29.9 Repeated and verified X2. (H)   Basophil     0.0 - 3.0 % 1.0   NEUT#     1.4 - 7.7 K/uL 4.2   Lymphocyte #     0.7 - 4.0 K/uL 2.6   Monocyte #     0.1 - 1.0 K/uL 0.8   Eosinophils Absolute     0.0 - 0.7 K/uL 3.3 (H)   Basophils Absolute     0.0 - 0.1 K/uL 0.1     Assessment and Plan: Eric [name] is a 66 y.o. male with: Moderate persistent asthma with acute  exacerbation Past history - Noted shortness of breath with swimming and first mile running. Sometimes has coughing and wheezing at the end. Runs 6 miles consistently. Denies any prior asthma diagnosis or inhaler use. 2023 spirometry was normal. Interim history - was doing well up until he had bronchitis and Covid-19. Had 2 rounds of zpak and 3 rounds of prednisone which helps but then symptoms flare when off medications. Recent CXR showed bronchitis and cbc diff showed elevated eos. No prior history of eosinophilia. Having some shaking and palpitations recently due to increased albuterol use.  Today's spirometry showed: restrictive disease with 76% improvement in FEV1 post bronchodilator treatment. Clinically feeling improved.  Finish prednisone. You may take tessalon perles as needed. Stop cetirizine. Stop NSAIDs. Keep pulmonology appointment. Daily controller medication(s): start Symbicort 2 puffs twice a day with spacer and rinse mouth afterwards. Spacer - demonstrated proper use  with inhaler. Patient understood technique and all questions/concerned were addressed.  Nebulizer machine given.  Stop Flovent. Continue Singulair (montelukast) 10mg  daily at night. During upper respiratory infections/flares:  Pretreat with levoalbuterol 2 puffs or levoalbuterol nebulizer.  If you need to use your levoalbuterol nebulizer machine back to back within 15-30 minutes with no relief then please go to the ER/urgent care for further evaluation.  May use levoalbuterol rescue inhaler 2 puffs or nebulizer every 4 to 6 hours as needed for shortness of breath, chest tightness, coughing, and wheezing. May use levoalbuterol rescue inhaler 2 puffs 5 to 15 minutes prior to strenuous physical activities. Monitor frequency of use.  Advised patient to hold off any strenuous activity until follow up in 2 weeks.  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 No recent eos to compare to. Last eos in 2017 was 230. Patient had about 3lbs weight loss the last 1-2 months but also not eating as much and exercising less as he has not been feeling well with his bouts of bronchitis and Covid-19. Also taking more NSAIDs.  Need to recheck CBC diff in 1-2 weeks to see how the eos are trending. Patient is on prednisone now and has appointment with pulmonology next week. If eos still persistently elevated then will need further work up. Could be also elevated due to recent Covid-19 infection.   Chronic rhinitis Past history - Some mild symptoms in the spring and fall. Tried zyrtec with no benefit. Skin testing in 2006 was positive to mold per patient. No prior AIT. 2023 skin prick testing was negative to indoor/outdoor allergens. Use Flonase (fluticasone) or Nasacort nasal spray 1 spray per nostril twice a day as needed for nasal congestion.   Dietary counseling and surveillance Past history - Avoiding gluten due to gluten intolerance - improved eczema and GI symptoms with avoidance. Noted brain  fog and headaches with legumes/tree nut ingestion.  Interim history - no issues with legumes.  Recommend to reintroduce foods one by one a few days apart and see if you notice any symptoms. Start with a few nuts at a time.  If no issues then okay to consume. If you notice issues then continue to avoid.   Return in about 2 weeks (around 12/24/2021).  ADDENDUM - patient sent a message after visit that he only lost about 3lbs and not 10lbs like he thought he did.   Meds ordered this encounter  Medications   budesonide-formoterol (SYMBICORT) 160-4.5 MCG/ACT inhaler    Sig: Inhale 2 puffs into the lungs in the morning and at bedtime. with spacer and rinse mouth  afterwards.    Dispense:  1 each    Refill:  3   levalbuterol (XOPENEX) 1.25 MG/3ML nebulizer solution    Sig: Take 1.25 mg by nebulization every 4 (four) hours as needed for wheezing or shortness of breath (coughing fits).    Dispense:  75 mL    Refill:  1   levalbuterol (XOPENEX HFA) 45 MCG/ACT inhaler    Sig: Inhale 2 puffs into the lungs every 4 (four) hours as needed for wheezing or shortness of breath (coughing fits).    Dispense:  1 each    Refill:  2   Spacer/Aero-Holding Chambers (AEROCHAMBER MV) inhaler    Sig: Use as instructed    Dispense:  1 each    Refill:  2   Lab Orders  No laboratory test(s) ordered today    Diagnostics: Spirometry:  Tracings reviewed. His effort: It was hard to get consistent efforts and there is a question as to whether this reflects a maximal maneuver. FVC: 2.67L FEV1: 2.01L, 68% predicted FEV1/FVC ratio: 69% Interpretation: Spirometry consistent with possible restrictive disease with 76% improvement in FEV1 post bronchodilator treatment. Clinically feeling improved.   Please see scanned spirometry results for details.  Medication List:  Current Outpatient Medications  Medication Sig Dispense Refill   albuterol (VENTOLIN HFA) 108 (90 Base) MCG/ACT inhaler Inhale 2 puffs into the  lungs every 4 (four) hours as needed for wheezing or shortness of breath (coughing fits). 18 g 1   ASPIRIN 81 PO Take 1 tablet by mouth daily as needed.     atorvastatin (LIPITOR) 80 MG tablet Take 1 tablet (80 mg total) by mouth at bedtime. 90 tablet 3   benzonatate (TESSALON) 100 MG capsule Take 1-2 capsules (100-200 mg total) by mouth 2 (two) times daily as needed for cough. 40 capsule 0   budesonide-formoterol (SYMBICORT) 160-4.5 MCG/ACT inhaler Inhale 2 puffs into the lungs in the morning and at bedtime. with spacer and rinse mouth afterwards. 1 each 3   cetirizine (ZYRTEC) 10 MG tablet Take 1 tablet (10 mg total) by mouth at bedtime. 30 tablet 11   COD LIVER OIL PO Take by mouth.     levalbuterol (XOPENEX HFA) 45 MCG/ACT inhaler Inhale 2 puffs into the lungs every 4 (four) hours as needed for wheezing or shortness of breath (coughing fits). 1 each 2   levalbuterol (XOPENEX) 1.25 MG/3ML nebulizer solution Take 1.25 mg by nebulization every 4 (four) hours as needed for wheezing or shortness of breath (coughing fits). 75 mL 1   montelukast (SINGULAIR) 10 MG tablet Take 1 tablet (10 mg total) by mouth at bedtime. 30 tablet 3   Omeprazole 20 MG TBEC Take 40 mg by mouth.     predniSONE (DELTASONE) 20 MG tablet 60 mg x3d, 40 mg x3d, 20 mg x2d, 10 mg x2d 18 tablet 0   Spacer/Aero-Holding Chambers (AEROCHAMBER MV) inhaler Use as instructed 1 each 2   TH MAGNESIUM PO Take by mouth.     No current facility-administered medications for this visit.   Allergies: Allergies  Allergen Reactions   Gluten Meal Diarrhea and Other (See Comments)    GI   I reviewed his past medical history, social history, family history, and environmental history and no significant changes have been reported from his previous visit.  Review of Systems  Constitutional:  Positive for appetite change and unexpected weight change. Negative for chills and fever.  HENT:  Negative for congestion and rhinorrhea.   Eyes:   Negative  for itching.  Respiratory:  Positive for cough, chest tightness, shortness of breath and wheezing.   Gastrointestinal:  Negative for abdominal pain.  Skin:  Negative for rash.  Allergic/Immunologic: Negative for environmental allergies.  Neurological:  Negative for headaches.    Objective: BP 130/70   Pulse 83   Temp 98.1 F (36.7 C) (Temporal)   Resp 20   Wt 150 lb 8 oz (68.3 kg)   SpO2 97%   BMI 24.29 kg/m  Body mass index is 24.29 kg/m. Physical Exam Vitals and nursing note reviewed.  Constitutional:      Appearance: Normal appearance. He is well-developed.  HENT:     Head: Normocephalic and atraumatic.     Right Ear: Tympanic membrane and external ear normal.     Left Ear: Tympanic membrane and external ear normal.     Nose: Nose normal.     Mouth/Throat:     Mouth: Mucous membranes are moist.     Pharynx: Oropharynx is clear.  Eyes:     Conjunctiva/sclera: Conjunctivae normal.  Cardiovascular:     Rate and Rhythm: Normal rate and regular rhythm.     Heart sounds: Normal heart sounds. No murmur heard. Pulmonary:     Effort: Pulmonary effort is normal.     Breath sounds: Normal breath sounds. No wheezing, rhonchi or rales.  Musculoskeletal:     Cervical back: Neck supple.  Skin:    General: Skin is warm.     Findings: No rash.  Neurological:     Mental Status: He is alert and oriented to person, place, and time.  Psychiatric:        Behavior: Behavior normal.    Previous notes and tests were reviewed. The plan was reviewed with the patient/family, and all questions/concerned were addressed.  It was my pleasure to see Takeshi today and participate in his care. Please feel free to contact me with any questions or concerns.  Sincerely,  Wyline Mood, DO Allergy & Immunology  [facility] office: [phone] [place] office: [phone]  Total Time: 40 minutes Time spent on day of service preparing to  see patient; obtaining and reviewing separately obtained history;  performing examination; counseling and educating patient and family; ordering medications, tests and procedures; documenting clinical information in the health record; independently interpreting results and communicating to patient/family.

## 2021-12-11 ENCOUNTER — Encounter: Payer: Self-pay | Admitting: Allergy

## 2021-12-11 ENCOUNTER — Encounter: Payer: Self-pay | Admitting: Family Medicine

## 2021-12-11 NOTE — Telephone Encounter (Signed)
No further action needed.

## 2021-12-14 ENCOUNTER — Encounter: Payer: Self-pay | Admitting: Allergy

## 2021-12-16 ENCOUNTER — Other Ambulatory Visit: Payer: Medicare Other

## 2021-12-19 ENCOUNTER — Ambulatory Visit (INDEPENDENT_AMBULATORY_CARE_PROVIDER_SITE_OTHER): Payer: Medicare Other | Admitting: Pulmonary Disease

## 2021-12-19 ENCOUNTER — Other Ambulatory Visit (HOSPITAL_BASED_OUTPATIENT_CLINIC_OR_DEPARTMENT_OTHER): Payer: Self-pay

## 2021-12-19 ENCOUNTER — Encounter (HOSPITAL_BASED_OUTPATIENT_CLINIC_OR_DEPARTMENT_OTHER): Payer: Self-pay | Admitting: Pulmonary Disease

## 2021-12-19 ENCOUNTER — Encounter: Payer: Self-pay | Admitting: *Deleted

## 2021-12-19 VITALS — BP 126/82 | HR 60 | Ht 66.0 in | Wt 150.0 lb

## 2021-12-19 DIAGNOSIS — J4550 Severe persistent asthma, uncomplicated: Secondary | ICD-10-CM | POA: Insufficient documentation

## 2021-12-19 DIAGNOSIS — D72119 Hypereosinophilic syndrome (hes), unspecified: Secondary | ICD-10-CM | POA: Diagnosis not present

## 2021-12-19 LAB — CBC WITH DIFFERENTIAL/PLATELET
Basophils Absolute: 0.1 10*3/uL (ref 0.0–0.2)
Basos: 1 %
EOS (ABSOLUTE): 0.5 10*3/uL — ABNORMAL HIGH (ref 0.0–0.4)
Eos: 7 %
Hematocrit: 49.9 % (ref 37.5–51.0)
Hemoglobin: 16.4 g/dL (ref 13.0–17.7)
Immature Grans (Abs): 0.1 10*3/uL (ref 0.0–0.1)
Immature Granulocytes: 1 %
Lymphocytes Absolute: 3 10*3/uL (ref 0.7–3.1)
Lymphs: 39 %
MCH: 29.1 pg (ref 26.6–33.0)
MCHC: 32.9 g/dL (ref 31.5–35.7)
MCV: 89 fL (ref 79–97)
Monocytes Absolute: 0.8 10*3/uL (ref 0.1–0.9)
Monocytes: 10 %
Neutrophils Absolute: 3.3 10*3/uL (ref 1.4–7.0)
Neutrophils: 42 %
Platelets: 334 10*3/uL (ref 150–450)
RBC: 5.63 x10E6/uL (ref 4.14–5.80)
RDW: 13.5 % (ref 11.6–15.4)
WBC: 7.7 10*3/uL (ref 3.4–10.8)

## 2021-12-19 MED ORDER — FLUTICASONE-SALMETEROL 230-21 MCG/ACT IN AERO
2.0000 | INHALATION_SPRAY | Freq: Two times a day (BID) | RESPIRATORY_TRACT | 2 refills | Status: DC
Start: 2021-12-19 — End: 2021-12-23

## 2021-12-19 MED ORDER — PREDNISONE 10 MG PO TABS: ORAL_TABLET | ORAL | 0 refills | Status: DC

## 2021-12-19 NOTE — Progress Notes (Signed)
Subjective:   PATIENT ID: Eric [name]: male DOB: [birth year], MRN: [id number]  Chief Complaint  Patient presents with   Consult    Server wheezing     Reason for Visit: New consult for asthma, frequent exacerbations  Eric [name] is a 66 year old male with asthma, allergic rhinitis, CAD, HLD, GERD who presents as a new consult for asthma.  He previously had exercise-induced asthma in late adulthood. He is active at baseline however began having recurrent asthmatic bronchitis flares.  [facility] Note from 12/10/21 reviewed. Has had worsening DOE and cough in the last two months since having acute bronchitis in September and then COVID-19 in October treated with Paxlovid. Had recurrent flare at the end of October and again in the beginning of November.  He is on high dose Symbicort, xopenex, singulair. Every time he completes a steroid taper, his symptoms return. He has some chest tightness, cough with deep inspiration. Denies nocturnal symptoms. Has tachycardia at times and sleep issues.   Previously treated with allergies related to leaves etc however recent (June 2023) allergy panel negative. Reports some gluten intolerance.  Social History: Negligible smoking history. 2 pack years  I have personally reviewed patient's past medical/family/social history, allergies, current medications.  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     Family  History  Problem Relation Age of Onset   Hypertension Mother    Breast cancer Mother    Hearing loss Mother    Hyperlipidemia Father    Hypertension Father    Dementia Father    Heart disease Father    Prostate cancer Father    Heart attack Father        Paternal uncle also had MI   Depression Father    Obesity Father    Alcohol abuse Sister    Allergic rhinitis Brother    Hypertension Brother    Alcohol abuse Brother    Coronary artery disease Paternal Uncle    Heart attack Paternal Uncle    Heart attack Paternal Uncle    Coronary artery disease Paternal Uncle    Heart disease Paternal Grandfather    Angioedema Other    Eczema Other    Colon cancer Neg Hx    Colon polyps Neg Hx    Pancreatic cancer Neg Hx    Rectal cancer Neg Hx    Stomach cancer Neg Hx      Social History   Occupational History   Occupation: Freight forwarder    Comment: Sports administrator  Tobacco Use   Smoking status: Former    Packs/day: 1.00    Years: 2.00    Total pack years: 2.00    Types: Cigarettes    Quit date: 07/04/1971    Years since quitting: 50.4    Passive exposure: Never   Smokeless tobacco: Never  Vaping Use  Vaping Use: Never used  Substance and Sexual Activity   Alcohol use: Yes    Alcohol/week: 7.0 standard drinks of alcohol    Types: 7 Glasses of wine per week   Drug use: No   Sexual activity: Not Currently    Partners: Female    Birth control/protection: Surgical    Comment: vasectomy    Allergies  Allergen Reactions   Gluten Meal Diarrhea and Other (See Comments)    GI     Outpatient Medications Prior to Visit  Medication Sig Dispense Refill   albuterol (VENTOLIN HFA) 108 (90 Base) MCG/ACT inhaler Inhale 2 puffs into the lungs every 4 (four) hours as needed for wheezing or shortness of breath (coughing fits). 18 g 1   ASPIRIN 81 PO Take 1 tablet by mouth daily as needed.     atorvastatin (LIPITOR) 80 MG tablet Take 1 tablet (80 mg total) by mouth at bedtime. 90 tablet 3    budesonide-formoterol (SYMBICORT) 160-4.5 MCG/ACT inhaler Inhale 2 puffs into the lungs in the morning and at bedtime. with spacer and rinse mouth afterwards. 1 each 3   cetirizine (ZYRTEC) 10 MG tablet Take 1 tablet (10 mg total) by mouth at bedtime. 30 tablet 11   COD LIVER OIL PO Take by mouth.     levalbuterol (XOPENEX HFA) 45 MCG/ACT inhaler Inhale 2 puffs into the lungs every 4 (four) hours as needed for wheezing or shortness of breath (coughing fits). 1 each 2   levalbuterol (XOPENEX) 1.25 MG/3ML nebulizer solution Take 1.25 mg by nebulization every 4 (four) hours as needed for wheezing or shortness of breath (coughing fits). 75 mL 1   montelukast (SINGULAIR) 10 MG tablet Take 1 tablet (10 mg total) by mouth at bedtime. 30 tablet 3   Omeprazole 20 MG TBEC Take 40 mg by mouth.     Spacer/Aero-Holding Chambers (AEROCHAMBER MV) inhaler Use as instructed 1 each 2   TH MAGNESIUM PO Take by mouth.     benzonatate (TESSALON) 100 MG capsule Take 1-2 capsules (100-200 mg total) by mouth 2 (two) times daily as needed for cough. (Patient not taking: Reported on 12/19/2021) 40 capsule 0   predniSONE (DELTASONE) 20 MG tablet 60 mg x3d, 40 mg x3d, 20 mg x2d, 10 mg x2d 18 tablet 0   No facility-administered medications prior to visit.    Review of Systems  Constitutional:  Negative for chills, diaphoresis, fever, malaise/fatigue and weight loss.  HENT:  Negative for congestion.   Respiratory:  Positive for cough and shortness of breath. Negative for hemoptysis, sputum production and wheezing.   Cardiovascular:  Positive for palpitations. Negative for chest pain (chest tightness) and leg swelling.     Objective:   Vitals:   12/19/21 0928  BP: 126/82  Pulse: 60  SpO2: 100%  Weight: 150 lb (68 kg)  Height: 5\' 6"  (1.676 m)   SpO2: 100 % O2 Device: None (Room air)  Physical Exam: General: Well-appearing, no acute distress HENT: [place], AT Eyes: EOMI, no scleral icterus Respiratory: Clear to  auscultation bilaterally.  No crackles, wheezing or rales Cardiovascular: RRR, -M/R/G, no JVD Extremities:-Edema,-tenderness Neuro: AAO x4, CNII-XII grossly intact Psych: Normal mood, normal affect  Data Reviewed:  Imaging: CT Cardiac 07/28/21 - Visualized lung parenchyma with no pulmonary nodules, masses, infiltrate, effusion or pneumothorax. CXR 12/08/21 - Mild bronchitic wall thickening  PFT: Spirometry 07/22/21 FVC 3.94 (104%) FEV1 2.93 (104%) Ratio 74%  Labs: CBC    Component Value Date/Time   WBC 10.9 (  H) 12/08/2021 1452   RBC 5.28 12/08/2021 1452   HGB 15.5 12/08/2021 1452   HCT 47.2 12/08/2021 1452   PLT 281.0 12/08/2021 1452   MCV 89.5 12/08/2021 1452   MCHC 32.9 12/08/2021 1452   RDW 14.8 12/08/2021 1452   LYMPHSABS 2.6 12/08/2021 1452   MONOABS 0.8 12/08/2021 1452   EOSABS 3.3 (H) 12/08/2021 1452   BASOSABS 0.1 12/08/2021 1452   Absolute eos 12/08/21 - 3300     Assessment & Plan:   Discussion: 66 year old male with asthma, allergic rhinitis, CAD, HLD, GERD who presents as a new consult for asthma.  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 Last abs eos 3300. Will repeat however suspect this may be suppressed with recent prednisone use. This may be related to asthma however will send to Hematology for evaluation in case his hypereosinophilia is driving his asthma symptoms. Continue to manage with bronchodilators. May be a candidate for Nucala in the future.  Adverse effects of steroids discussed including bruising, weight gain, fluid retention, Cushingoid features, hypertension, cardiac arrhythmias, osteoporosis, gastric ulcers, increased risk of infection, insomnia and hyperglycemia.  Hypereosinophilia Severe persistent asthma --START Advair 250 mcg TWO puffs in the morning and evening --CONTINUE levabuterol AS NEEDED for shortness of breath or wheezing --CONTINUE montelukast 10 mg daily --REFER Hematology for hypereosinophilia  (consider BMBx if appropriate) --ORDER CBC with diff --Standing prednisone pack available  Health Maintenance Immunization History  Administered Date(s) Administered   Influenza Split 12/23/2011, 11/28/2018, 11/21/2019   Influenza,inj,Quad PF,6+ Mos 01/17/2014, 12/11/2014, 12/11/2015, 12/11/2016, 11/14/2020   Moderna Covid-19 Vaccine Bivalent Booster 54yrs & up 10/21/2020   Moderna Sars-Covid-2 Vaccination 05/23/2020   PFIZER(Purple Top)SARS-COV-2 Vaccination 04/09/2019, 05/02/2019, 11/21/2019, 05/23/2020   PNEUMOCOCCAL CONJUGATE-20 09/09/2021   Pfizer Covid-19 Vaccine Bivalent Booster 79yrs & up 10/21/2020   Td 05/15/2008   CT Lung Screen - not qualified  Orders Placed This Encounter  Procedures   CBC w/Diff    Standing Status:   Future    Number of Occurrences:   1    Standing Expiration Date:   12/19/2022   Ambulatory referral to Hematology / Oncology    Referral Priority:   Urgent    Referral Type:   Consultation    Referral Reason:   Specialty Services Required    Requested Specialty:   Oncology    Number of Visits Requested:   1   Meds ordered this encounter  Medications   DISCONTD: fluticasone-salmeterol (ADVAIR HFA) 230-21 MCG/ACT inhaler    Sig: Inhale 2 puffs into the lungs 2 (two) times daily.    Dispense:  1 each    Refill:  2   predniSONE (DELTASONE) 10 MG tablet    Sig: Take 6 tablets x three days (60 mg), followed by 5 tablets x three days (50mg ), then 4 tablets x three day(40mg ), then 3 tablets (30mg ) x three days, then 2 tablets (20mg ) x three days, then 1 tablet (10mg ) x three days, then STOP    Dispense:  60 tablet    Refill:  0    Return in about 1 month (around 01/18/2022).  I have spent a total time of 45-minutes on the day of the appointment reviewing prior documentation, coordinating care and discussing medical diagnosis and plan with the patient/family. Imaging, labs and tests included in this note have been reviewed and interpreted independently  by me.  [place], MD [facility] 12/19/2021 9:39 AM  Office Number [phone]

## 2021-12-19 NOTE — Progress Notes (Signed)
Received referral from Dr. Irma Newness w/[facility] for hypereosinophilic syndrome. Forwarded to nurse navigator.

## 2021-12-19 NOTE — Patient Instructions (Addendum)
Hypereosinophilia Severe persistent asthma --START Advair 250 mcg TWO puffs in the morning and evening --CONTINUE levabuterol AS NEEDED for shortness of breath or wheezing --CONTINUE montelukast 10 mg daily --REFER Hematology for hypereosinophilia --ORDER CBC with diff --Standing prednisone pack available  Follow-up with me in 1 month

## 2021-12-23 ENCOUNTER — Telehealth: Payer: Self-pay | Admitting: *Deleted

## 2021-12-23 MED ORDER — FLUTICASONE-SALMETEROL 500-50 MCG/ACT IN AEPB: 1.0000 | INHALATION_SPRAY | Freq: Two times a day (BID) | RESPIRATORY_TRACT | 5 refills | Status: DC

## 2021-12-23 NOTE — Telephone Encounter (Signed)
Advair Diskus 500-50 ONE puff TWICE a day. Cancelled HFA inhaler

## 2021-12-23 NOTE — Telephone Encounter (Signed)
called 11/21 to discuss "Dr Everardo All, you prescribed generic advair 230/21 HFA which will cost $220. I can get the discus inhaler at 250/50 for $82. Would it make sense to change prescriptions? Dan"   Patient would also like to be informed of his lab results. Please call and advise patient. [phone]

## 2021-12-23 NOTE — Progress Notes (Unsigned)
Follow Up Note  RE: Eric [name] MRN: [id number] DOB: Jan 06, 1956 Date of Office Visit: 12/24/2021  Referring provider: Natalia Leatherwood, DO Primary care provider: Natalia Leatherwood, DO  Chief Complaint: No chief complaint on file.  History of Present Illness: I had the pleasure of seeing Eric [name] for a follow up visit at the [facility] on 12/23/2021. He is a 66 y.o. male, who is being followed for asthma, peripheral eosinophilia, chronic rhinitis. His previous allergy office visit was on 12/10/2021 with Dr. Selena Batten. Today is a regular follow up visit.  Moderate persistent asthma with acute exacerbation Past history - Noted shortness of breath with swimming and first mile running. Sometimes has coughing and wheezing at the end. Runs 6 miles consistently. Denies any prior asthma diagnosis or inhaler use. 2023 spirometry was normal. Interim history - was doing well up until he had bronchitis and Covid-19. Had 2 rounds of zpak and 3 rounds of prednisone which helps but then symptoms flare when off medications. Recent CXR showed bronchitis and cbc diff showed elevated eos. No prior history of eosinophilia. Having some shaking and palpitations recently due to increased albuterol use.  Today's spirometry showed: restrictive disease with 76% improvement in FEV1 post bronchodilator treatment. Clinically feeling improved.  Finish prednisone. You may take tessalon perles as needed. Stop cetirizine. Stop NSAIDs. Keep pulmonology appointment. Daily controller medication(s): start Symbicort 2 puffs twice a day with spacer and rinse mouth afterwards. Spacer - demonstrated proper use with inhaler. Patient understood technique and all questions/concerned were addressed.  Nebulizer machine given.  Stop Flovent. Continue Singulair (montelukast) 10mg  daily at night. During upper respiratory infections/flares:  Pretreat with levoalbuterol 2 puffs or levoalbuterol nebulizer.  If you  need to use your levoalbuterol nebulizer machine back to back within 15-30 minutes with no relief then please go to the ER/urgent care for further evaluation.  May use levoalbuterol rescue inhaler 2 puffs or nebulizer every 4 to 6 hours as needed for shortness of breath, chest tightness, coughing, and wheezing. May use levoalbuterol rescue inhaler 2 puffs 5 to 15 minutes prior to strenuous physical activities. Monitor frequency of use.  Advised patient to hold off any strenuous activity until follow up in 2 weeks.   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 No recent eos to compare to. Last eos in 2017 was 230. Patient had about 3lbs weight loss the last 1-2 months but also not eating as much and exercising less as he has not been feeling well with his bouts of bronchitis and Covid-19. Also taking more NSAIDs.  Need to recheck CBC diff in 1-2 weeks to see how the eos are trending. Patient is on prednisone now and has appointment with pulmonology next week. If eos still persistently elevated then will need further work up. Could be also elevated due to recent Covid-19 infection.    Chronic rhinitis Past history - Some mild symptoms in the spring and fall. Tried zyrtec with no benefit. Skin testing in 2006 was positive to mold per patient. No prior AIT. 2023 skin prick testing was negative to indoor/outdoor allergens. Use Flonase (fluticasone) or Nasacort nasal spray 1 spray per nostril twice a day as needed for nasal congestion.    Dietary counseling and surveillance Past history - Avoiding gluten due to gluten intolerance - improved eczema and GI symptoms with avoidance. Noted brain fog and headaches with legumes/tree nut ingestion.  Interim history - no issues with legumes.  Recommend to reintroduce foods one by one a few days apart and see if you notice any symptoms. Start with a few nuts at a time.  If no issues then okay to consume. If you notice issues then continue to  avoid.    Return in about 2 weeks (around 12/24/2021).   ADDENDUM - patient sent a message after visit that he only lost about 3lbs and not 10lbs like he thought he did.   Assessment and Plan: Eric [name] is a 66 y.o. male with: No problem-specific Assessment & Plan notes found for this encounter.  No follow-ups on file.  No orders of the defined types were placed in this encounter.  Lab Orders  No laboratory test(s) ordered today    Diagnostics: Spirometry:  Tracings reviewed. His effort: {Blank single:19197::"Good reproducible efforts.","It was hard to get consistent efforts and there is a question as to whether this reflects a maximal maneuver.","Poor effort, data can not be interpreted."} FVC: ***L FEV1: ***L, ***% predicted FEV1/FVC ratio: ***% Interpretation: {Blank single:19197::"Spirometry consistent with mild obstructive disease","Spirometry consistent with moderate obstructive disease","Spirometry consistent with severe obstructive disease","Spirometry consistent with possible restrictive disease","Spirometry consistent with mixed obstructive and restrictive disease","Spirometry uninterpretable due to technique","Spirometry consistent with normal pattern","No overt abnormalities noted given today's efforts"}.  Please see scanned spirometry results for details.  Skin Testing: {Blank single:19197::"Select foods","Environmental allergy panel","Environmental allergy panel and select foods","Food allergy panel","None","Deferred due to recent antihistamines use"}. *** Results discussed with patient/family.   Medication List:  Current Outpatient Medications  Medication Sig Dispense Refill   albuterol (VENTOLIN HFA) 108 (90 Base) MCG/ACT inhaler Inhale 2 puffs into the lungs every 4 (four) hours as needed for wheezing or shortness of breath (coughing fits). 18 g 1   ASPIRIN 81 PO Take 1 tablet by mouth daily as needed.     atorvastatin (LIPITOR) 80 MG tablet Take 1 tablet (80 mg  total) by mouth at bedtime. 90 tablet 3   benzonatate (TESSALON) 100 MG capsule Take 1-2 capsules (100-200 mg total) by mouth 2 (two) times daily as needed for cough. (Patient not taking: Reported on 12/19/2021) 40 capsule 0   cetirizine (ZYRTEC) 10 MG tablet Take 1 tablet (10 mg total) by mouth at bedtime. 30 tablet 11   COD LIVER OIL PO Take by mouth.     fluticasone-salmeterol (ADVAIR HFA) 230-21 MCG/ACT inhaler Inhale 2 puffs into the lungs 2 (two) times daily. 1 each 2   levalbuterol (XOPENEX HFA) 45 MCG/ACT inhaler Inhale 2 puffs into the lungs every 4 (four) hours as needed for wheezing or shortness of breath (coughing fits). 1 each 2   levalbuterol (XOPENEX) 1.25 MG/3ML nebulizer solution Take 1.25 mg by nebulization every 4 (four) hours as needed for wheezing or shortness of breath (coughing fits). 75 mL 1   montelukast (SINGULAIR) 10 MG tablet Take 1 tablet (10 mg total) by mouth at bedtime. 30 tablet 3   Omeprazole 20 MG TBEC Take 40 mg by mouth.     predniSONE (DELTASONE) 10 MG tablet Take 6 tablets x three days (60 mg), followed by 5 tablets x three days (50mg ), then 4 tablets x three day(40mg ), then 3 tablets (30mg ) x three days, then 2 tablets (20mg ) x three days, then 1 tablet (10mg ) x three days, then STOP 60 tablet 0   Spacer/Aero-Holding Chambers (AEROCHAMBER MV) inhaler Use as instructed 1 each 2   TH MAGNESIUM PO Take by mouth.     No current facility-administered medications for this visit.   Allergies:  Allergies  Allergen Reactions   Gluten Meal Diarrhea and Other (See Comments)    GI   I reviewed his past medical history, social history, family history, and environmental history and no significant changes have been reported from his previous visit.  Review of Systems  Constitutional:  Positive for appetite change and unexpected weight change. Negative for chills and fever.  HENT:  Negative for congestion and rhinorrhea.   Eyes:  Negative for itching.  Respiratory:   Positive for cough, chest tightness, shortness of breath and wheezing.   Gastrointestinal:  Negative for abdominal pain.  Skin:  Negative for rash.  Allergic/Immunologic: Negative for environmental allergies.  Neurological:  Negative for headaches.    Objective: There were no vitals taken for this visit. There is no height or weight on file to calculate BMI. Physical Exam Vitals and nursing note reviewed.  Constitutional:      Appearance: Normal appearance. He is well-developed.  HENT:     Head: Normocephalic and atraumatic.     Right Ear: Tympanic membrane and external ear normal.     Left Ear: Tympanic membrane and external ear normal.     Nose: Nose normal.     Mouth/Throat:     Mouth: Mucous membranes are moist.     Pharynx: Oropharynx is clear.  Eyes:     Conjunctiva/sclera: Conjunctivae normal.  Cardiovascular:     Rate and Rhythm: Normal rate and regular rhythm.     Heart sounds: Normal heart sounds. No murmur heard. Pulmonary:     Effort: Pulmonary effort is normal.     Breath sounds: Normal breath sounds. No wheezing, rhonchi or rales.  Musculoskeletal:     Cervical back: Neck supple.  Skin:    General: Skin is warm.     Findings: No rash.  Neurological:     Mental Status: He is alert and oriented to person, place, and time.  Psychiatric:        Behavior: Behavior normal.    Previous notes and tests were reviewed. The plan was reviewed with the patient/family, and all questions/concerned were addressed.  It was my pleasure to see Eric [name] today and participate in his care. Please feel free to contact me with any questions or concerns.  Sincerely,  Wyline Mood, DO Allergy & Immunology  [facility] office: [phone] [facility] office: [phone]

## 2021-12-23 NOTE — Telephone Encounter (Signed)
Called patient and he states that the Advair HFA is costing him over $200. But the diskus Advair will cost him $82.   He wants to know if he can go to the diskus inhaler  Please advise

## 2021-12-24 ENCOUNTER — Encounter: Payer: Self-pay | Admitting: Allergy

## 2021-12-24 ENCOUNTER — Other Ambulatory Visit: Payer: Self-pay

## 2021-12-24 ENCOUNTER — Ambulatory Visit (INDEPENDENT_AMBULATORY_CARE_PROVIDER_SITE_OTHER): Payer: Medicare Other | Admitting: Allergy

## 2021-12-24 VITALS — BP 112/76 | HR 68 | Temp 98.2°F | Resp 16 | Ht 65.0 in | Wt 150.9 lb

## 2021-12-24 DIAGNOSIS — J4540 Moderate persistent asthma, uncomplicated: Secondary | ICD-10-CM

## 2021-12-24 DIAGNOSIS — Z713 Dietary counseling and surveillance: Secondary | ICD-10-CM

## 2021-12-24 DIAGNOSIS — D7219 Other eosinophilia: Secondary | ICD-10-CM | POA: Diagnosis not present

## 2021-12-24 DIAGNOSIS — J310 Chronic rhinitis: Secondary | ICD-10-CM | POA: Diagnosis not present

## 2021-12-24 MED ORDER — ALBUTEROL SULFATE (2.5 MG/3ML) 0.083% IN NEBU: 2.5000 mg | INHALATION_SOLUTION | RESPIRATORY_TRACT | 1 refills | Status: DC | PRN

## 2021-12-24 NOTE — Assessment & Plan Note (Signed)
Past history - Noted shortness of breath with swimming and first mile running. Sometimes has coughing and wheezing at the end. Runs 6 miles consistently. Denies any prior asthma diagnosis or inhaler use. 2023 spirometry was normal. Interim history - finished prednisone last week and did not have a flare in symptoms. Doing well now from asthma point. Likes to use albuterol better than levoalbuterol. Saw pulmonology and given referral to heme/onc and prednisone rx just in case.  Normal spirometry today.  Daily controller medication(s): continue Symbicort 2 puffs twice a day with spacer and rinse mouth afterwards. Continue Singulair (montelukast) 10mg  daily at night. During upper respiratory infections/flares:  Pretreat with levo/albuterol 2 puffs or albuterol nebulizer.  If you need to use your levoalbuterol nebulizer machine back to back within 15-30 minutes with no relief then please go to the ER/urgent care for further evaluation.  May use levo/albuterol rescue inhaler 2 puffs or nebulizer every 4 to 6 hours as needed for shortness of breath, chest tightness, coughing, and wheezing. May use levo/albuterol rescue inhaler 2 puffs 5 to 15 minutes prior to strenuous physical activities. Monitor frequency of use.  Get spirometry at next visit. Okay to start with gradual physical activity.

## 2021-12-24 NOTE — Assessment & Plan Note (Addendum)
Past history - 12/08/2021 cbc diff showed eos of 3300. No recent eos to compare to. Last eos in 2017 was 230. Patient had about 3lbs weight loss the last 1-2 months but also not eating as much and exercising less as he has not been feeling well with his bouts of bronchitis and Covid-19. Also taking more NSAIDs. Started aspirin in spring 2023.  Interim history - repeat CBC diff on 11/17 was only 500 but just finished prednisone the day before. Recheck cbc diff today. Keep hematology appointment.

## 2021-12-24 NOTE — Assessment & Plan Note (Signed)
Past history - Some mild symptoms in the spring and fall. Tried zyrtec with no benefit. Skin testing in 2006 was positive to mold per patient. No prior AIT. 2023 skin prick testing was negative to indoor/outdoor allergens. Interim history - concerned about allergies and wants to get bloodwork for this.  Get bloodwork for environmental panel.  Use Flonase (fluticasone) or Nasacort nasal spray 1 spray per nostril twice a day as needed for nasal congestion.

## 2021-12-24 NOTE — Patient Instructions (Addendum)
Breathing Daily controller medication(s): continue Symbicort 2 puffs twice a day with spacer and rinse mouth afterwards. Continue Singulair (montelukast) 10mg  daily at night. During upper respiratory infections/flares:  Pretreat with levo/albuterol 2 puffs or albuterol nebulizer.  If you need to use your levoalbuterol nebulizer machine back to back within 15-30 minutes with no relief then please go to the ER/urgent care for further evaluation.  May use levo/albuterol rescue inhaler 2 puffs or nebulizer every 4 to 6 hours as needed for shortness of breath, chest tightness, coughing, and wheezing. May use levo/albuterol rescue inhaler 2 puffs 5 to 15 minutes prior to strenuous physical activities. Monitor frequency of use.  breathing control goals:  Full participation in all desired activities (may need albuterol before activity) Albuterol use two times or less a week on average (not counting use with activity) Cough interfering with sleep two times or less a month Oral steroids no more than once a year No hospitalizations   Eosinophils Recheck cbc today. Keep hematology appointment for now.   Rhinitis 2023 skin testing was negative to indoor/outdoor allergens.  Get bloodwork for environmental panel.  Use Flonase (fluticasone) or Nasacort nasal spray 1 spray per nostril twice a day as needed for nasal congestion.   Food Keep a food journal with symptoms and foods eaten. Hold off reintroducing foods until your work up is completed.   Follow up in 6 weeks or sooner if needed.

## 2021-12-24 NOTE — Assessment & Plan Note (Signed)
Past history - Avoiding gluten due to gluten intolerance - improved eczema and GI symptoms with avoidance. Noted brain fog and headaches with legumes/tree nut ingestion.  Interim history - no issues with legumes.  Keep a food journal with symptoms and foods eaten. Hold off reintroducing foods until eosinophilia work up is completed.

## 2021-12-24 NOTE — Telephone Encounter (Signed)
[facility] Telephone Encounter  Updated patient on lab results. Abs eos reduced to 500 though may be a result of recent prednisone use. He did not have to use his prednisone pack over the weekend. I updated him on order of the Advair diskus and counseled on inhaler use. Patient had no further questions or concerns.

## 2021-12-27 ENCOUNTER — Encounter (HOSPITAL_BASED_OUTPATIENT_CLINIC_OR_DEPARTMENT_OTHER): Payer: Self-pay | Admitting: Pulmonary Disease

## 2021-12-28 LAB — CBC WITH DIFFERENTIAL/PLATELET
Basophils Absolute: 0.1 10*3/uL (ref 0.0–0.2)
Basos: 1 %
EOS (ABSOLUTE): 0.4 10*3/uL (ref 0.0–0.4)
Eos: 7 %
Hematocrit: 47.4 % (ref 37.5–51.0)
Hemoglobin: 16.2 g/dL (ref 13.0–17.7)
Immature Grans (Abs): 0 10*3/uL (ref 0.0–0.1)
Immature Granulocytes: 1 %
Lymphocytes Absolute: 1.9 10*3/uL (ref 0.7–3.1)
Lymphs: 33 %
MCH: 29.7 pg (ref 26.6–33.0)
MCHC: 34.2 g/dL (ref 31.5–35.7)
MCV: 87 fL (ref 79–97)
Monocytes Absolute: 0.5 10*3/uL (ref 0.1–0.9)
Monocytes: 9 %
Neutrophils Absolute: 2.8 10*3/uL (ref 1.4–7.0)
Neutrophils: 49 %
Platelets: 313 10*3/uL (ref 150–450)
RBC: 5.46 x10E6/uL (ref 4.14–5.80)
RDW: 13.5 % (ref 11.6–15.4)
WBC: 5.6 10*3/uL (ref 3.4–10.8)

## 2021-12-28 LAB — ALLERGENS W/TOTAL IGE AREA 2
Alternaria Alternata IgE: <0.1 kU/L
Aspergillus Fumigatus IgE: <0.1 kU/L
Bermuda Grass IgE: <0.1 kU/L
Cat Dander IgE: 0.29 kU/L — AB
Cedar, Mountain IgE: <0.1 kU/L
Cladosporium Herbarum IgE: <0.1 kU/L
Cockroach, German IgE: <0.1 kU/L
Common Silver Birch IgE: <0.1 kU/L
Cottonwood IgE: <0.1 kU/L
D Farinae IgE: <0.1 kU/L
D Pteronyssinus IgE: <0.1 kU/L
Dog Dander IgE: 0.22 kU/L — AB
Elm, American IgE: <0.1 kU/L
IgE (Immunoglobulin E), Serum: 201 IU/mL (ref 6–495)
Johnson Grass IgE: <0.1 kU/L
Maple/Box Elder IgE: <0.1 kU/L
Mouse Urine IgE: <0.1 kU/L
Oak, White IgE: <0.1 kU/L
Pecan, Hickory IgE: <0.1 kU/L
Penicillium Chrysogen IgE: <0.1 kU/L
Pigweed, Rough IgE: <0.1 kU/L
Ragweed, Short IgE: <0.1 kU/L
Sheep Sorrel IgE Qn: <0.1 kU/L
Timothy Grass IgE: <0.1 kU/L
White Mulberry IgE: <0.1 kU/L

## 2022-01-06 NOTE — Telephone Encounter (Signed)
See phone note from 11/21. Will close encounter.

## 2022-01-07 ENCOUNTER — Other Ambulatory Visit: Payer: Self-pay | Admitting: *Deleted

## 2022-01-07 DIAGNOSIS — D721 Eosinophilia, unspecified: Secondary | ICD-10-CM

## 2022-01-12 NOTE — Progress Notes (Unsigned)
New Hematology/Oncology Consult   Requesting MD: Dr. Everardo All  [phone]      Reason for Consult: Elevated eosinophils  HPI: [name] is a 66 year old man referred for evaluation of elevated eosinophils on a CBC.  He saw his PCP on 12/08/2021 for evaluation of a cough in the setting of COVID bronchitis which was treated with Paxlovid, albuterol, steroid x 2, Flovent, azithromycin x 2.  CBC showed hemoglobin 15.5, white count 10.9, absolute eosinophils 3.3.  He saw Dr. Everardo All on 12/19/2021 with repeat CBC showing hemoglobin of 16.4, white count 7.7, absolute eosinophils 0.5, platelet count 334,000; CBC 12/24/2021 hemoglobin 16.2, white count 5.6, absolute eosinophils in normal range at 0.4, platelet count 313,000.     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   Coronary CT Angiograpm  July-August 2018   1) Calcium score 147 which is 74th percentile for age and sex 2) Right dominant coronary arteries with Less than 30% calcified plaque in proximal LAD. Less than 50% calcified plaque in proximal RCA, Less than 30% soft plauqe in mid RCA and Less than 30% calcified plaque in distal RCA   MOUTH SURGERY  1965   TOE SURGERY  2010   TONSILLECTOMY  1962   VASECTOMY       Current Outpatient Medications:    albuterol (PROVENTIL) (2.5 MG/3ML) 0.083% nebulizer  solution, Take 3 mLs (2.5 mg total) by nebulization every 4 (four) hours as needed for wheezing or shortness of breath (coughing fits)., Disp: 75 mL, Rfl: 1   albuterol (VENTOLIN HFA) 108 (90 Base) MCG/ACT inhaler, Inhale 2 puffs into the lungs every 4 (four) hours as needed for wheezing or shortness of breath (coughing fits)., Disp: 18 g, Rfl: 1   ASPIRIN 81 PO, Take 1 tablet by mouth daily as needed., Disp: , Rfl:    atorvastatin (LIPITOR) 80 MG tablet, Take 1 tablet (80 mg total) by mouth at bedtime., Disp: 90 tablet, Rfl: 3   budesonide-formoterol (SYMBICORT) 160-4.5 MCG/ACT inhaler, Inhale 2 puffs into the lungs 2 (two) times daily., Disp: , Rfl:    cetirizine (ZYRTEC) 10 MG tablet, Take 1 tablet (10 mg total) by mouth at bedtime., Disp: 30 tablet, Rfl: 11   COD LIVER OIL PO, Take by mouth., Disp: , Rfl:    levalbuterol (XOPENEX HFA) 45 MCG/ACT inhaler, Inhale 2 puffs into the lungs every 4 (four) hours as needed for wheezing or shortness of breath (coughing fits)., Disp: 1 each, Rfl: 2   levalbuterol (XOPENEX) 1.25 MG/3ML nebulizer solution, Take 1.25 mg by nebulization every 4 (four) hours as needed for wheezing or shortness of breath (coughing fits)., Disp: 75 mL, Rfl: 1   montelukast (SINGULAIR) 10 MG tablet, Take 1 tablet (10 mg total) by mouth at  bedtime., Disp: 30 tablet, Rfl: 3   Omeprazole 20 MG TBEC, Take 40 mg by mouth., Disp: , Rfl:    predniSONE (DELTASONE) 10 MG tablet, Take 6 tablets x three days (60 mg), followed by 5 tablets x three days (50mg ), then 4 tablets x three day(40mg ), then 3 tablets (30mg ) x three days, then 2 tablets (20mg ) x three days, then 1 tablet (10mg ) x three days, then STOP (Patient not taking: Reported on 12/24/2021), Disp: 60 tablet, Rfl: 0   Spacer/Aero-Holding Chambers (AEROCHAMBER MV) inhaler, Use as instructed, Disp: 1 each, Rfl: 2   TH MAGNESIUM PO, Take by mouth., Disp: , Rfl: :  :   Allergies  Allergen Reactions   Gluten Meal Diarrhea and Other  (See Comments)    GI    FH:  SOCIAL HISTORY:  Review of Systems:  Physical Exam:  There were no vitals taken for this visit.  HEENT: *** Lungs: *** Cardiac: *** Abdomen: *** GU: ***  Vascular: *** Lymph nodes: *** Neurologic: *** Skin: *** Musculoskeletal: ***  LABS:  No results for input(s): "WBC", "HGB", "HCT", "PLT" in the last 72 hours.  No results for input(s): "NA", "K", "CL", "CO2", "GLUCOSE", "BUN", "CREATININE", "CALCIUM" in the last 72 hours.    RADIOLOGY:  No results found.  Assessment and Plan:   ***    , NP 01/12/2022, 4:07 PM

## 2022-01-13 ENCOUNTER — Inpatient Hospital Stay: Payer: Medicare Other | Attending: Oncology

## 2022-01-13 ENCOUNTER — Encounter: Payer: Self-pay | Admitting: Nurse Practitioner

## 2022-01-13 ENCOUNTER — Inpatient Hospital Stay (HOSPITAL_BASED_OUTPATIENT_CLINIC_OR_DEPARTMENT_OTHER): Payer: Medicare Other | Admitting: Nurse Practitioner

## 2022-01-13 VITALS — BP 125/59 | HR 70 | Temp 97.9°F | Resp 18 | Ht 65.0 in | Wt 150.8 lb

## 2022-01-13 DIAGNOSIS — D721 Eosinophilia, unspecified: Secondary | ICD-10-CM

## 2022-01-13 DIAGNOSIS — I2510 Atherosclerotic heart disease of native coronary artery without angina pectoris: Secondary | ICD-10-CM | POA: Insufficient documentation

## 2022-01-13 DIAGNOSIS — Z87891 Personal history of nicotine dependence: Secondary | ICD-10-CM | POA: Insufficient documentation

## 2022-01-13 LAB — CBC WITH DIFFERENTIAL (CANCER CENTER ONLY)
Abs Immature Granulocytes: 0.02 10*3/uL (ref 0.00–0.07)
Basophils Absolute: 0 10*3/uL (ref 0.0–0.1)
Basophils Relative: 1 %
Eosinophils Absolute: 0.3 10*3/uL (ref 0.0–0.5)
Eosinophils Relative: 6 %
HCT: 47.3 % (ref 39.0–52.0)
Hemoglobin: 15.7 g/dL (ref 13.0–17.0)
Immature Granulocytes: 0 %
Lymphocytes Relative: 25 %
Lymphs Abs: 1.4 10*3/uL (ref 0.7–4.0)
MCH: 29.3 pg (ref 26.0–34.0)
MCHC: 33.2 g/dL (ref 30.0–36.0)
MCV: 88.2 fL (ref 80.0–100.0)
Monocytes Absolute: 0.4 10*3/uL (ref 0.1–1.0)
Monocytes Relative: 8 %
Neutro Abs: 3.4 10*3/uL (ref 1.7–7.7)
Neutrophils Relative %: 60 %
Platelet Count: 325 10*3/uL (ref 150–400)
RBC: 5.36 MIL/uL (ref 4.22–5.81)
RDW: 13.8 % (ref 11.5–15.5)
WBC Count: 5.6 10*3/uL (ref 4.0–10.5)
nRBC: 0 % (ref 0.0–0.2)

## 2022-01-13 LAB — SAVE SMEAR(SSMR), FOR PROVIDER SLIDE REVIEW

## 2022-01-14 ENCOUNTER — Telehealth: Payer: Self-pay

## 2022-01-14 ENCOUNTER — Other Ambulatory Visit: Payer: Self-pay

## 2022-01-14 DIAGNOSIS — D721 Eosinophilia, unspecified: Secondary | ICD-10-CM

## 2022-01-14 LAB — VITAMIN B12: Vitamin B-12: 445 pg/mL (ref 180–914)

## 2022-01-14 NOTE — Telephone Encounter (Signed)
Sunni add the b12

## 2022-01-14 NOTE — Telephone Encounter (Signed)
-----   Message from Rana Snare, NP sent at 01/14/2022  8:49 AM EST ----- Please ask lab to add B12 to labs from yesterday

## 2022-01-16 ENCOUNTER — Encounter (HOSPITAL_BASED_OUTPATIENT_CLINIC_OR_DEPARTMENT_OTHER): Payer: Self-pay | Admitting: Pulmonary Disease

## 2022-01-16 ENCOUNTER — Ambulatory Visit (INDEPENDENT_AMBULATORY_CARE_PROVIDER_SITE_OTHER): Payer: Medicare Other | Admitting: Pulmonary Disease

## 2022-01-16 VITALS — BP 110/68 | HR 72 | Ht 65.0 in | Wt 152.1 lb

## 2022-01-16 DIAGNOSIS — D72119 Hypereosinophilic syndrome (hes), unspecified: Secondary | ICD-10-CM

## 2022-01-16 DIAGNOSIS — J4550 Severe persistent asthma, uncomplicated: Secondary | ICD-10-CM | POA: Diagnosis not present

## 2022-01-16 DIAGNOSIS — R0683 Snoring: Secondary | ICD-10-CM

## 2022-01-16 NOTE — Progress Notes (Signed)
Subjective:   PATIENT ID: Eric [name] GENDER: male DOB: July 08, 1955, MRN: [id number]  Chief Complaint  Patient presents with   Follow-up    Wants to stop taking advair    Reason for Visit: Follow-up  Eric [name] is a 66 year old male with asthma, allergic rhinitis, CAD, HLD, GERD who presents for follow-up.  Initial consult He previously had exercise-induced asthma in late adulthood. He is active at baseline however began having recurrent asthmatic bronchitis flares.  [facility] Note from 12/10/21 reviewed. Has had worsening DOE and cough in the last two months since having acute bronchitis in September and then COVID-19 in October treated with Paxlovid. Had recurrent flare at the end of October and again in the beginning of November.  He is on high dose Symbicort, xopenex, singulair. Every time he completes a steroid taper, his symptoms return. He has some chest tightness, cough with deep inspiration. Denies nocturnal symptoms. Has tachycardia at times and sleep issues.   Previously treated with allergies related to leaves etc however recent (June 2023) allergy panel negative. Reports some gluten intolerance.  01/16/22 He reports he is compliant with his Advair. Able to exercise regularly and not needing albuterol. He does have some chest tightness at night. He does notice that sometimes he feels restricted while running. Sometimes will wake up at night with headache. This seemed to have started after COVID. Wife reports snoring at night. Denies withnessed apnea. He naps daily. Falls asleep easily.   Social History: Negligible smoking history. 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     Family History  Problem Relation Age of Onset   Hypertension Mother    Breast cancer Mother    Hearing loss Mother    Hyperlipidemia Father    Hypertension Father    Dementia Father    Heart disease Father    Prostate cancer Father    Heart attack Father        Paternal uncle also had MI   Depression Father    Obesity Father    Alcohol abuse Sister    Allergic rhinitis Brother    Hypertension Brother    Alcohol abuse Brother    Coronary artery disease Paternal Uncle    Heart attack Paternal Uncle    Heart attack Paternal Uncle    Coronary artery disease Paternal Uncle    Heart disease Paternal Grandfather    Angioedema Other    Eczema Other    Colon cancer Neg Hx    Colon polyps Neg Hx    Pancreatic cancer Neg Hx    Rectal cancer Neg Hx    Stomach cancer Neg Hx      Social History   Occupational History   Occupation: Production designer, theatre/television/film    Comment: Health and safety inspector  Tobacco Use   Smoking status: Former  Packs/day: 1.00    Years: 2.00    Total pack years: 2.00    Types: Cigarettes    Quit date: 07/04/1971    Years since quitting: 50.5    Passive exposure: Never   Smokeless tobacco: Never  Vaping Use   Vaping Use: Never used  Substance and Sexual Activity   Alcohol use: Yes    Alcohol/week: 7.0 standard drinks of alcohol    Types: 7 Glasses of wine per week   Drug use: No   Sexual activity: Not Currently    Partners: Female    Birth control/protection: Surgical    Comment: vasectomy    Allergies  Allergen Reactions   Gluten Meal Diarrhea and Other (See Comments)    GI     Outpatient Medications Prior to Visit  Medication Sig Dispense Refill   albuterol (PROVENTIL) (2.5 MG/3ML) 0.083% nebulizer solution Take 3  mLs (2.5 mg total) by nebulization every 4 (four) hours as needed for wheezing or shortness of breath (coughing fits). 75 mL 1   albuterol (VENTOLIN HFA) 108 (90 Base) MCG/ACT inhaler Inhale 2 puffs into the lungs every 4 (four) hours as needed for wheezing or shortness of breath (coughing fits). 18 g 1   ASPIRIN 81 PO Take 1 tablet by mouth daily as needed.     atorvastatin (LIPITOR) 80 MG tablet Take 1 tablet (80 mg total) by mouth at bedtime. 90 tablet 3   b complex vitamins capsule Take 1 capsule by mouth daily.     COD LIVER OIL PO Take by mouth.     Omeprazole 20 MG TBEC Take 40 mg by mouth.     predniSONE (DELTASONE) 10 MG tablet Take 6 tablets x three days (60 mg), followed by 5 tablets x three days (50mg ), then 4 tablets x three day(40mg ), then 3 tablets (30mg ) x three days, then 2 tablets (20mg ) x three days, then 1 tablet (10mg ) x three days, then STOP 60 tablet 0   Probiotic Product (PROBIOTIC ADVANCED PO) Take by mouth.     Spacer/Aero-Holding Chambers (AEROCHAMBER MV) inhaler Use as instructed 1 each 2   TH MAGNESIUM PO Take by mouth.     ADVAIR DISKUS 500-50 MCG/ACT AEPB 1 puff in the morning and at bedtime.     cetirizine (ZYRTEC) 10 MG tablet Take 1 tablet (10 mg total) by mouth at bedtime. (Patient not taking: Reported on 01/13/2022) 30 tablet 11   montelukast (SINGULAIR) 10 MG tablet Take 1 tablet (10 mg total) by mouth at bedtime. (Patient not taking: Reported on 01/13/2022) 30 tablet 3   No facility-administered medications prior to visit.    Review of Systems  Constitutional:  Negative for chills, diaphoresis, fever, malaise/fatigue and weight loss.  HENT:  Negative for congestion.   Respiratory:  Negative for cough, hemoptysis, sputum production, shortness of breath and wheezing.   Cardiovascular:  Negative for chest pain (chest tightness), palpitations and leg swelling.     Objective:   Vitals:   01/16/22 1305  BP: 110/68  Pulse: 72  SpO2: 99%  Weight: 152 lb  1.9 oz (69 kg)  Height: 5\' 5"  (1.651 m)   SpO2: 99 % O2 Device: None (Room air)  Physical Exam: General: Well-appearing, no acute distress HENT: [place], AT Eyes: EOMI, no scleral icterus Respiratory: Clear to auscultation bilaterally.  No crackles, wheezing or rales Cardiovascular: RRR, -M/R/G, no JVD Extremities:-Edema,-tenderness Neuro: AAO x4, CNII-XII grossly intact Psych: Normal mood, normal affect  Data Reviewed:  Imaging: CT Cardiac  07/28/21 - Visualized lung parenchyma with no pulmonary nodules, masses, infiltrate, effusion or pneumothorax. CXR 12/08/21 - Mild bronchitic wall thickening  PFT: Spirometry 07/22/21 FVC 3.94 (104%) FEV1 2.93 (104%) Ratio 74%  Labs: CBC    Component Value Date/Time   WBC 5.6 01/13/2022 1058   WBC 10.9 (H) 12/08/2021 1452   RBC 5.36 01/13/2022 1058   HGB 15.7 01/13/2022 1058   HGB 16.2 12/24/2021 1123   HCT 47.3 01/13/2022 1058   HCT 47.4 12/24/2021 1123   PLT 325 01/13/2022 1058   PLT 313 12/24/2021 1123   MCV 88.2 01/13/2022 1058   MCV 87 12/24/2021 1123   MCH 29.3 01/13/2022 1058   MCHC 33.2 01/13/2022 1058   RDW 13.8 01/13/2022 1058   RDW 13.5 12/24/2021 1123   LYMPHSABS 1.4 01/13/2022 1058   LYMPHSABS 1.9 12/24/2021 1123   MONOABS 0.4 01/13/2022 1058   EOSABS 0.3 01/13/2022 1058   EOSABS 0.4 12/24/2021 1123   BASOSABS 0.0 01/13/2022 1058   BASOSABS 0.1 12/24/2021 1123   Absolute eos  12/08/21 - 3300 12/19/21 - 500 12/24/21 - 400 01/13/22 - 300  IgE 12/24/21 - 201    Assessment & Plan:   Discussion: 66 year old male with asthma, allergic rhinitis, CAD, HLD, GERD who presents for follow-up. History of multiple exacerbations in setting of severe peripheral eosinophilia from Sept to Nov 2023. Peak abs eos 3300. Seen by Hematology with thought this is secondary to acute COVID. Recent peripheral eosinophilia improved to 300-400 range on high dose ICS/LABA. He still has symptoms that sound asthma-like however prefers to  de-escalate inhalers. If eosinophilia is asthma related he may have a resurgence of symptoms but he would still like a trial. Will order ONO to rule out nocturnal hypoxemia - if positive will need to consider home sleep study and PFTs to evaluate DLCO.   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 --STOP Advair 500 --RESTART Symbicort 160 TWO puffs in the morning and evening x 2 weeks> --If symptoms stable, reduced Symbicort ONE puff in morning and evening x 2 weeks> --If symptoms stable, ok to stop --CONTINUE levabuterol AS NEEDED for shortness of breath or wheezing  Snoring --ORDER overnight oximetry  Health Maintenance Immunization History  Administered Date(s) Administered   Influenza Split 12/23/2011, 11/28/2018, 11/21/2019   Influenza,inj,Quad PF,6+ Mos 01/17/2014, 12/11/2014, 12/11/2015, 12/11/2016, 11/14/2020   Moderna Covid-19 Vaccine Bivalent Booster 26yrs & up 10/21/2020   Moderna Sars-Covid-2 Vaccination 05/23/2020   PFIZER(Purple Top)SARS-COV-2 Vaccination 04/09/2019, 05/02/2019, 11/21/2019, 05/23/2020   PNEUMOCOCCAL CONJUGATE-20 09/09/2021   Pfizer Covid-19 Vaccine Bivalent Booster 9yrs & up 10/21/2020   Td 05/15/2008   CT Lung Screen - not qualified  Orders Placed This Encounter  Procedures   Pulse oximetry, overnight    Standing Status:   Future    Standing Expiration Date:   01/17/2023    Scheduling Instructions:     Lindell Spar on room air   No orders of the defined types were placed in this encounter.   Return in about 2 months (around 03/19/2022).  I have spent a total time of 30-minutes on the day of the appointment including chart review, data review, collecting history, coordinating care and discussing medical diagnosis and plan with the patient/family. Past medical history, allergies, medications were reviewed. Pertinent imaging, labs and tests included in this note have been reviewed and interpreted independently by me.  Jairo Bellew  Mechele Collin, MD [facility] 01/16/2022 2:25 PM  Office Number [phone]

## 2022-01-16 NOTE — Patient Instructions (Signed)
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 Selena Batten  Snoring --ORDER overnight oximetry  Follow-up with me in 2 months

## 2022-01-19 ENCOUNTER — Ambulatory Visit (HOSPITAL_BASED_OUTPATIENT_CLINIC_OR_DEPARTMENT_OTHER): Payer: Medicare Other | Admitting: Pulmonary Disease

## 2022-01-20 ENCOUNTER — Other Ambulatory Visit: Payer: Medicare Other

## 2022-01-20 ENCOUNTER — Encounter: Payer: Medicare Other | Admitting: Nurse Practitioner

## 2022-02-05 ENCOUNTER — Encounter: Payer: Self-pay | Admitting: Family Medicine

## 2022-02-05 ENCOUNTER — Ambulatory Visit (INDEPENDENT_AMBULATORY_CARE_PROVIDER_SITE_OTHER): Payer: Medicare Other | Admitting: Family Medicine

## 2022-02-05 VITALS — BP 143/75 | HR 64 | Temp 97.7°F | Wt 157.4 lb

## 2022-02-05 DIAGNOSIS — J4541 Moderate persistent asthma with (acute) exacerbation: Secondary | ICD-10-CM | POA: Diagnosis not present

## 2022-02-05 DIAGNOSIS — D7219 Other eosinophilia: Secondary | ICD-10-CM

## 2022-02-05 LAB — CBC WITH DIFFERENTIAL/PLATELET
Basophils Absolute: 0.1 10*3/uL (ref 0.0–0.1)
Basophils Relative: 1 % (ref 0.0–3.0)
Eosinophils Absolute: 0.9 10*3/uL — ABNORMAL HIGH (ref 0.0–0.7)
Eosinophils Relative: 13.7 % — ABNORMAL HIGH (ref 0.0–5.0)
HCT: 46.8 % (ref 39.0–52.0)
Hemoglobin: 15.5 g/dL (ref 13.0–17.0)
Lymphocytes Relative: 21.5 % (ref 12.0–46.0)
Lymphs Abs: 1.4 10*3/uL (ref 0.7–4.0)
MCHC: 33 g/dL (ref 30.0–36.0)
MCV: 89.4 fl (ref 78.0–100.0)
Monocytes Absolute: 0.8 10*3/uL (ref 0.1–1.0)
Monocytes Relative: 11.3 % (ref 3.0–12.0)
Neutro Abs: 3.5 10*3/uL (ref 1.4–7.7)
Neutrophils Relative %: 52.5 % (ref 43.0–77.0)
Platelets: 267 10*3/uL (ref 150.0–400.0)
RBC: 5.23 Mil/uL (ref 4.22–5.81)
RDW: 14.7 % (ref 11.5–15.5)
WBC: 6.7 10*3/uL (ref 4.0–10.5)

## 2022-02-05 MED ORDER — BUDESONIDE-FORMOTEROL FUMARATE 160-4.5 MCG/ACT IN AERO: 2.0000 | INHALATION_SPRAY | Freq: Two times a day (BID) | RESPIRATORY_TRACT | 5 refills | Status: DC

## 2022-02-05 MED ORDER — AZITHROMYCIN 250 MG PO TABS: ORAL_TABLET | ORAL | 0 refills | Status: AC

## 2022-02-05 NOTE — Progress Notes (Signed)
Eric [name] , [birth year], 67 y.o., male MRN: [id number] Patient Care Team    Relationship Specialty Notifications Start End  Natalia Leatherwood, DO PCP - General Family Medicine  06/06/21   [facility] Consulting Physician Optometry  06/09/21     Chief Complaint  Patient presents with   Cough    Neg covid today. Onset 6 days ago     Subjective: Eric [name] is a 67 y.o. male present for new onset cough, nasal drainage, mild wheezing and mild sinus symptoms along with fatigue of approximately 6 days duration.  He states he was around his family who all were ill as well.  However he is not recovering as quickly as he would have expected and he feels like he is worsening.  He denies any fevers, chills changes in appetite or bowel/bladder function.  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  He states he just quit taking his Symbicort 1 week ago.  He has been working with pulmonology to taper off Advair, onto Symbicort, then slowly off of Symbicort.  He reports on the Symbicort he was still feeling great and all the symptoms he had had prior with his peripheral eosinophilia had completely resolved.  He is the son of chills were last checked mid December and had returned to normal. He states he is not taking his antihistamine or Singulair.  He has a prescription for prednisone taper on hand, if needing by another provider.  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 Peak abs eos 3300. Seen by Hematology with thought this is secondary to acute COVID. Recent peripheral eosinophilia improved to 300-400 range on high dose ICS/LABA. He still has symptoms that sound asthma-like however prefers to de-escalate inhalers. If eosinophilia is asthma related he may have a resurgence of symptoms but he would still like a trial. "  Prior note: for his  4th visit for s/p covid  bronchitis. Tx with paxlovid, albuterol, steroid x2, flovent start, azith x2.  He is present today  d/t bronchitis symptoms reported are worse and "needs more complete plan for bronchitis assessment and recovery." He unfortunately seems to have a recurrent rebound of symptoms after treatments. He reports he will feel good during treatment, have complete resolution of symptoms and then about 2 days after completing tx plan rebound symptoms occur. He recently had a trip to [place], and states he felt good in [place]. He denies fever or chills. He has a cough that keeps him up nightly. He is taking tessalon perles. He is not taking an antihistamine. He reports needing his albuterol inhaler about every 2-3 hours for wheezing- even after start of flovent.  Prior note: virtually today to discuss his continued symptoms status post COVID.  He was treated with Paxlovid and feels like he is having COVID rebound.  He is using his inhaler a few times a day, which is unusual for him.  Denies fevers, chills, nausea or vomit.  He is having some sinus symptoms.  Prior note: Pt presents for an OV with complaints of dry cough, wheezing occasional short of breath and feeling hoarse for a little over a month.  He states he had a cold around a month ago that he believes he got through his grandson.  He did not feel he needs treated and the symptoms resolved rather quickly with the exception of the  above symptoms mentioned. He has a history of exercise-induced asthma.  He admittedly did not think about using his albuterol inhaler until this morning.  He felt that he did not get the relief he normally would receive from the use of the albuterol.  He denies any fevers or chills. Had paxlovid     09/09/2021    8:13 AM 06/06/2021    1:46 PM  Depression screen PHQ 2/9  Decreased Interest 0 0  Down, Depressed, Hopeless 0 0  PHQ - 2 Score 0 0    Allergies  Allergen Reactions   Gluten Meal Diarrhea and Other (See Comments)     GI   Social History   Social History Narrative   He is been married for 37 years.    Masters degree executive VP for [employer]   He is an avid exercise enthusiast - runs, bicycling and swimming at least 5 days a week for 40 minutes in time.   Has smoke alarm in the home   Wears a seatbelt.   Feels safe in his relationship.   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   Coronary CT Angiograpm  July-August 2018   1) Calcium score 147 which is 74th percentile for age and sex 2) Right dominant coronary arteries with Less than 30% calcified plaque in proximal LAD. Less than 50% calcified plaque in proximal RCA, Less than 30% soft plauqe in mid RCA and Less than 30% calcified plaque in distal RCA   [place]   TOE SURGERY  2010   TONSILLECTOMY  1962   VASECTOMY     Family History  Problem Relation Age of Onset   Hypertension Mother    Breast cancer Mother    Hearing loss Mother    Hyperlipidemia Father    Hypertension Father    Dementia Father    Heart disease Father    Prostate cancer Father    Heart attack Father        Paternal uncle also had MI   Depression Father    Obesity Father    Alcohol abuse Sister    Allergic rhinitis Brother    Hypertension Brother    Alcohol abuse Brother     Coronary artery disease Paternal Uncle    Heart attack Paternal Uncle    Heart attack Paternal Uncle    Coronary artery disease Paternal Uncle    Heart disease Paternal Grandfather    Angioedema Other    Eczema Other    Colon cancer Neg Hx    Colon polyps Neg Hx    Pancreatic cancer Neg Hx    Rectal cancer Neg Hx    Stomach cancer Neg Hx  Allergies as of 02/05/2022       Reactions   Gluten Meal Diarrhea, Other (See Comments)   GI        Medication List        Accurate as of February 05, 2022 12:31 PM. If you have any questions, ask your nurse or doctor.          STOP taking these medications    predniSONE 10 MG tablet Commonly known as: DELTASONE Stopped by: Howard Pouch, DO       TAKE these medications    AeroChamber MV inhaler Use as instructed   albuterol 108 (90 Base) MCG/ACT inhaler Commonly known as: Ventolin HFA Inhale 2 puffs into the lungs every 4 (four) hours as needed for wheezing or shortness of breath (coughing fits).   albuterol (2.5 MG/3ML) 0.083% nebulizer solution Commonly known as: PROVENTIL Take 3 mLs (2.5 mg total) by nebulization every 4 (four) hours as needed for wheezing or shortness of breath (coughing fits).   ASPIRIN 81 PO Take 1 tablet by mouth daily as needed.   atorvastatin 80 MG tablet Commonly known as: LIPITOR Take 1 tablet (80 mg total) by mouth at bedtime.   azithromycin 250 MG tablet Commonly known as: ZITHROMAX Take 2 tablets on day 1, then 1 tablet daily on days 2 through 5 Started by: Howard Pouch, DO   b complex vitamins capsule Take 1 capsule by mouth daily.   budesonide-formoterol 160-4.5 MCG/ACT inhaler Commonly known as: SYMBICORT Inhale 2 puffs into the lungs 2 (two) times daily. Started by: Howard Pouch, DO   COD LIVER OIL PO Take by mouth.   Omeprazole 20 MG Tbec Take 40 mg by mouth.   PROBIOTIC ADVANCED PO Take by mouth.   TH MAGNESIUM PO Take by mouth.        All past medical  history, surgical history, allergies, family history, immunizations andmedications were updated in the EMR today and reviewed under the history and medication portions of their EMR.     Review of Systems  Constitutional:  Positive for malaise/fatigue. Negative for chills and fever.  HENT:  Positive for congestion and sinus pain. Negative for sore throat.   Eyes:  Negative for pain and discharge.  Respiratory:  Positive for cough. Negative for sputum production, shortness of breath and wheezing.   Gastrointestinal:  Negative for nausea and vomiting.  Neurological:  Negative for dizziness and headaches.   Negative, with the exception of above mentioned in HPI   Objective:  BP (!) 143/75   Pulse 64   Temp 97.7 F (36.5 C)   Wt 157 lb 6.4 oz (71.4 kg)   SpO2 100%   BMI 26.19 kg/m  Body mass index is 26.19 kg/m. Physical Exam Vitals and nursing note reviewed.  Constitutional:      General: He is not in acute distress.    Appearance: Normal appearance. He is not ill-appearing, toxic-appearing or diaphoretic.  HENT:     Head: Normocephalic and atraumatic.  Eyes:     General: No scleral icterus.       Right eye: No discharge.        Left eye: No discharge.     Extraocular Movements: Extraocular movements intact.     Pupils: Pupils are equal, round, and reactive to light.  Cardiovascular:     Rate and Rhythm: Normal rate and regular rhythm.  Pulmonary:     Effort: Pulmonary effort is normal.     Breath sounds: Wheezing (Very mild) present.  No rhonchi or rales.  Musculoskeletal:     Cervical back: Neck supple.  Lymphadenopathy:     Cervical: No cervical adenopathy.  Skin:    General: Skin is warm and dry.  Neurological:     Mental Status: He is alert and oriented to person, place, and time. Mental status is at baseline.  Psychiatric:        Mood and Affect: Mood normal.        Behavior: Behavior normal.        Thought Content: Thought content normal.        Judgment:  Judgment normal.     No results found. No results found. No results found for this or any previous visit (from the past 24 hour(s)).  Assessment/Plan: Eric [name] is a 67 y.o. male present for OV for  Peripheral eosinophilia - CBC w/Diff.  - We discussed if his monocytes are severely elevated, would recommend he start the steroid pack he has at home.  If they are only just mildly elevated, then he can wait and see how he responds to treatment described below.  However, if symptoms are still worsening despite treatment, he understands to start the steroid pack regardless of monocyte count.  Moderate persistent asthmatic bronchitis with acute exacerbation Rest, hydrate.  mucinex (DM if cough), nettie pot or nasal saline.  Z-Pak prescribed, take until completed.  Restart Symbicort Restart antihistamine. Depending upon lab results and/or clinical outcome may need to follow-up with pulmonology again.   Reviewed expectations re: course of current medical issues. Discussed self-management of symptoms. Outlined signs and symptoms indicating need for more acute intervention. Patient verbalized understanding and all questions were answered. Patient received an After-Visit Summary.    Orders Placed This Encounter  Procedures   CBC w/Diff   Meds ordered this encounter  Medications   budesonide-formoterol (SYMBICORT) 160-4.5 MCG/ACT inhaler    Sig: Inhale 2 puffs into the lungs 2 (two) times daily.    Dispense:  1 each    Refill:  5   azithromycin (ZITHROMAX) 250 MG tablet    Sig: Take 2 tablets on day 1, then 1 tablet daily on days 2 through 5    Dispense:  6 tablet    Refill:  0   Referral Orders  No referral(s) requested today     Note is dictated utilizing voice recognition software. Although note has been proof read prior to signing, occasional typographical errors still can be missed. If any questions arise, please do not hesitate to call for verification.    electronically signed by:  Felix Pacini, DO  [facility] [place]

## 2022-02-05 NOTE — Patient Instructions (Signed)
No follow-ups on file.        Great to see you today.  I have refilled the medication(s) we provide.   If labs were collected, we will inform you of lab results once received either by echart message or telephone call.   - echart message- for normal results that have been seen by the patient already.   - telephone call: abnormal results or if patient has not viewed results in their echart.  

## 2022-02-06 ENCOUNTER — Other Ambulatory Visit: Payer: Self-pay

## 2022-02-06 ENCOUNTER — Telehealth (HOSPITAL_BASED_OUTPATIENT_CLINIC_OR_DEPARTMENT_OTHER): Payer: Self-pay | Admitting: Pulmonary Disease

## 2022-02-06 ENCOUNTER — Ambulatory Visit: Payer: Medicare Other | Admitting: Family Medicine

## 2022-02-06 DIAGNOSIS — G4734 Idiopathic sleep related nonobstructive alveolar hypoventilation: Secondary | ICD-10-CM

## 2022-02-06 NOTE — Telephone Encounter (Signed)
[facility] Results  Discussed ONO 01/28/22 results:  SpO2 <88% 13 min 6 sec. Nadir SpO2 48% Mean 91%  Assessment Nocturnal Hypoxemia --Reviewed results with patient. Discussed options including oxygen vs home sleep study vs split night. Recommended split night to rule out central apnea however after discussion will hold on ordering testing until next visit and order oxygen for now  Plan --Please order 1L O2 via [facility] nightly

## 2022-02-06 NOTE — Telephone Encounter (Signed)
Dr. Loanne Drilling I have placed the order for O2 can you please sign

## 2022-02-06 NOTE — Telephone Encounter (Signed)
Thank you. Signed.

## 2022-02-09 ENCOUNTER — Encounter: Payer: Self-pay | Admitting: Family Medicine

## 2022-02-09 NOTE — Telephone Encounter (Signed)
FYI

## 2022-02-15 NOTE — Progress Notes (Unsigned)
Follow Up Note  RE: Eric [name] MRN: [id number] DOB: 21-Aug-1955 Date of Office Visit: 02/16/2022  Referring provider: Ma Hillock, DO Primary care provider: Ma Hillock, DO  Chief Complaint: No chief complaint on file.  History of Present Illness: I had the pleasure of seeing Eric [name] for a follow up visit at the [facility] on 02/15/2022. He is a 66 y.o. male, who is being followed for asthma, eosinophilia, chronic rhinitis. His previous allergy office visit was on 12/24/2021 with Dr. Maudie Mercury. Today is a regular follow up visit.  Covid booster after work up  Asthma Past history - Noted shortness of breath with swimming and first mile running. Sometimes has coughing and wheezing at the end. Runs 6 miles consistently. Denies any prior asthma diagnosis or inhaler use. 2023 spirometry was normal. Interim history - finished prednisone last week and did not have a flare in symptoms. Doing well now from asthma point. Likes to use albuterol better than levoalbuterol. Saw pulmonology and given referral to heme/onc and prednisone rx just in case.  Normal spirometry today.  Daily controller medication(s): continue Symbicort 122mcg 2 puffs twice a day with spacer and rinse mouth afterwards. Continue Singulair (montelukast) 10mg  daily at night. During upper respiratory infections/flares:  Pretreat with levo/albuterol 2 puffs or albuterol nebulizer.  If you need to use your levoalbuterol nebulizer machine back to back within 15-30 minutes with no relief then please go to the ER/urgent care for further evaluation.  May use levo/albuterol rescue inhaler 2 puffs or nebulizer every 4 to 6 hours as needed for shortness of breath, chest tightness, coughing, and wheezing. May use levo/albuterol rescue inhaler 2 puffs 5 to 15 minutes prior to strenuous physical activities. Monitor frequency of use.  Get spirometry at next visit. Okay to start with gradual physical activity.    Peripheral eosinophilia Past history - 12/08/2021 cbc diff showed eos of 3300. No recent eos to compare to. Last eos in 2017 was 230. Patient had about 3lbs weight loss the last 1-2 months but also not eating as much and exercising less as he has not been feeling well with his bouts of bronchitis and Covid-19. Also taking more NSAIDs. Started aspirin in spring 2023.  Interim history - repeat CBC diff on 11/17 was only 500 but just finished prednisone the day before. Recheck cbc diff today. Keep hematology appointment.   Chronic rhinitis Past history - Some mild symptoms in the spring and fall. Tried zyrtec with no benefit. Skin testing in 2006 was positive to mold per patient. No prior AIT. 2023 skin prick testing was negative to indoor/outdoor allergens. Interim history - concerned about allergies and wants to get bloodwork for this.  Get bloodwork for environmental panel.  Use Flonase (fluticasone) or Nasacort nasal spray 1 spray per nostril twice a day as needed for nasal congestion.   Dietary counseling and surveillance Past history - Avoiding gluten due to gluten intolerance - improved eczema and GI symptoms with avoidance. Noted brain fog and headaches with legumes/tree nut ingestion.  Interim history - no issues with legumes.  Keep a food journal with symptoms and foods eaten. Hold off reintroducing foods until eosinophilia work up is completed.   Return in about 6 weeks (around 02/04/2022).  01/13/2022 heme/onc OV: "Assessment and Plan:    Elevated eosinophils initially noted 12/08/2021  Multiple respiratory illnesses September 2023 through late October 2023 including COVID.  He has been treated with multiple courses of steroids in  addition to Paxlovid and azithromycin.  Last oral steroid 12/18/2021. CAD   Eric [name] was referred for evaluation of elevated eosinophils earlier this year.  CBC from today shows the absolute eosinophil count is in normal range.  Remainder of CBC is normal  as well.  The elevated eosinophils on blood work 12/08/2021 may have been related to COVID which he was diagnosed with a few weeks prior to the lab.  Recommend obtaining a repeat CBC with differential in the next 1 to 2 months.   We did not schedule additional follow-up in the hematology clinic.  We are available to see him in the future if the eosinophil count increases or he develops other hematologic abnormality."  Assessment and Plan: Eric [name] is a 67 y.o. male with: No problem-specific Assessment & Plan notes found for this encounter.  No follow-ups on file.  No orders of the defined types were placed in this encounter.  Lab Orders  No laboratory test(s) ordered today    Diagnostics: Spirometry:  Tracings reviewed. His effort: {Blank single:19197::"Good reproducible efforts.","It was hard to get consistent efforts and there is a question as to whether this reflects a maximal maneuver.","Poor effort, data can not be interpreted."} FVC: ***L FEV1: ***L, ***% predicted FEV1/FVC ratio: ***% Interpretation: {Blank single:19197::"Spirometry consistent with mild obstructive disease","Spirometry consistent with moderate obstructive disease","Spirometry consistent with severe obstructive disease","Spirometry consistent with possible restrictive disease","Spirometry consistent with mixed obstructive and restrictive disease","Spirometry uninterpretable due to technique","Spirometry consistent with normal pattern","No overt abnormalities noted given today's efforts"}.  Please see scanned spirometry results for details.  Skin Testing: {Blank single:19197::"Select foods","Environmental allergy panel","Environmental allergy panel and select foods","Food allergy panel","None","Deferred due to recent antihistamines use"}. *** Results discussed with patient/family.   Medication List:  Current Outpatient Medications  Medication Sig Dispense Refill   albuterol (PROVENTIL) (2.5 MG/3ML) 0.083% nebulizer  solution Take 3 mLs (2.5 mg total) by nebulization every 4 (four) hours as needed for wheezing or shortness of breath (coughing fits). 75 mL 1   albuterol (VENTOLIN HFA) 108 (90 Base) MCG/ACT inhaler Inhale 2 puffs into the lungs every 4 (four) hours as needed for wheezing or shortness of breath (coughing fits). 18 g 1   ASPIRIN 81 PO Take 1 tablet by mouth daily as needed.     atorvastatin (LIPITOR) 80 MG tablet Take 1 tablet (80 mg total) by mouth at bedtime. 90 tablet 3   b complex vitamins capsule Take 1 capsule by mouth daily.     budesonide-formoterol (SYMBICORT) 160-4.5 MCG/ACT inhaler Inhale 2 puffs into the lungs 2 (two) times daily. 1 each 5   COD LIVER OIL PO Take by mouth.     Omeprazole 20 MG TBEC Take 40 mg by mouth.     Probiotic Product (PROBIOTIC ADVANCED PO) Take by mouth.     Spacer/Aero-Holding Chambers (AEROCHAMBER MV) inhaler Use as instructed 1 each 2   TH MAGNESIUM PO Take by mouth.     No current facility-administered medications for this visit.   Allergies: Allergies  Allergen Reactions   Gluten Meal Diarrhea and Other (See Comments)    GI   I reviewed his past medical history, social history, family history, and environmental history and no significant changes have been reported from his previous visit.  Review of Systems  Constitutional:  Negative for appetite change, chills, fever and unexpected weight change.  HENT:  Negative for congestion and rhinorrhea.   Eyes:  Negative for itching.  Respiratory:  Positive for cough. Negative for chest tightness, shortness  of breath and wheezing.   Gastrointestinal:  Negative for abdominal pain.  Skin:  Negative for rash.  Allergic/Immunologic: Negative for environmental allergies.  Neurological:  Negative for headaches.    Objective: There were no vitals taken for this visit. There is no height or weight on file to calculate BMI. Physical Exam Vitals and nursing note reviewed.  Constitutional:      Appearance:  Normal appearance. He is well-developed.  HENT:     Head: Normocephalic and atraumatic.     Right Ear: Tympanic membrane and external ear normal.     Left Ear: Tympanic membrane and external ear normal.     Nose: Nose normal.     Mouth/Throat:     Mouth: Mucous membranes are moist.     Pharynx: Oropharynx is clear.  Eyes:     Conjunctiva/sclera: Conjunctivae normal.  Cardiovascular:     Rate and Rhythm: Normal rate and regular rhythm.     Heart sounds: Normal heart sounds. No murmur heard. Pulmonary:     Effort: Pulmonary effort is normal.     Breath sounds: Normal breath sounds. No wheezing, rhonchi or rales.  Musculoskeletal:     Cervical back: Neck supple.  Skin:    General: Skin is warm.     Findings: No rash.  Neurological:     Mental Status: He is alert and oriented to person, place, and time.  Psychiatric:        Behavior: Behavior normal.    Previous notes and tests were reviewed. The plan was reviewed with the patient/family, and all questions/concerned were addressed.  It was my pleasure to see Eric [name] today and participate in his care. Please feel free to contact me with any questions or concerns.  Sincerely,  Rexene Alberts, DO Allergy & Immunology  [facility] office: [place] office: [phone]

## 2022-02-16 ENCOUNTER — Ambulatory Visit (INDEPENDENT_AMBULATORY_CARE_PROVIDER_SITE_OTHER): Payer: Medicare Other | Admitting: Allergy

## 2022-02-16 ENCOUNTER — Other Ambulatory Visit: Payer: Self-pay

## 2022-02-16 ENCOUNTER — Encounter: Payer: Self-pay | Admitting: Allergy

## 2022-02-16 VITALS — BP 124/92 | HR 60 | Temp 97.7°F | Resp 16

## 2022-02-16 DIAGNOSIS — J310 Chronic rhinitis: Secondary | ICD-10-CM

## 2022-02-16 DIAGNOSIS — Z713 Dietary counseling and surveillance: Secondary | ICD-10-CM

## 2022-02-16 DIAGNOSIS — R030 Elevated blood-pressure reading, without diagnosis of hypertension: Secondary | ICD-10-CM

## 2022-02-16 DIAGNOSIS — J4540 Moderate persistent asthma, uncomplicated: Secondary | ICD-10-CM | POA: Diagnosis not present

## 2022-02-16 DIAGNOSIS — D7219 Other eosinophilia: Secondary | ICD-10-CM

## 2022-02-16 MED ORDER — AIRSUPRA 90-80 MCG/ACT IN AERO: 2.0000 | INHALATION_SPRAY | RESPIRATORY_TRACT | 2 refills | Status: DC | PRN

## 2022-02-16 NOTE — Assessment & Plan Note (Signed)
Monitor at home, if consistently elevated (above 130/85) then please follow up with PCP.

## 2022-02-16 NOTE — Patient Instructions (Addendum)
Breathing Monitor prednisone use.  Daily controller medication(s): continue Symbicort 136mcg 2 puffs twice a day with spacer and rinse mouth afterwards. May use Airsupra rescue inhaler 2 puffs every 4 to 6 hours as needed for shortness of breath, chest tightness, coughing, and wheezing. Do not use more than 12 puffs in 24 hours. May use Airsupra rescue inhaler 2 puffs 5 to 15 minutes prior to strenuous physical activities. Monitor frequency of use. Rinse mouth after each use.  Sample given.  If Colin Ina not covered then go back to use your regular rescue inhaler.  During respiratory infections/flares:  Pretreat with levo/albuterol or Airsupra 2 puffs or albuterol nebulizer.  If you need to use your albuterol nebulizer machine back to back within 15-30 minutes with no relief then please go to the ER/urgent care for further evaluation.  Breathing control goals:  Full participation in all desired activities (may need albuterol before activity) Albuterol use two times or less a week on average (not counting use with activity) Cough interfering with sleep two times or less a month Oral steroids no more than once a year No hospitalizations   Eosinophils No indication to recheck today.   Rhinitis 2023 skin testing was negative to indoor/outdoor allergens.  2023 bloodwork was borderline to cat and dog. See below for environmental control measures.  Use Flonase (fluticasone) or Nasacort nasal spray 1 spray per nostril twice a day as needed for nasal congestion.   Food Reintroduce foods one by one.   Blood pressure Monitor at home, if consistently elevated (above 130/85) then please follow up with PCP.   Follow up in 2 months or sooner if needed. Keep follow up with pulmonology.  Okay to get Covid-19 booster first, then flu, then RSV, then Shingles.   Pet Allergen Avoidance: Contrary to popular opinion, there are no "hypoallergenic" breeds of dogs or cats. That is because people are not  allergic to an animal's hair, but to an allergen found in the animal's saliva, dander (dead skin flakes) or urine. Pet allergy symptoms typically occur within minutes. For some people, symptoms can build up and become most severe 8 to 12 hours after contact with the animal. People with severe allergies can experience reactions in public places if dander has been transported on the pet owners' clothing. Keeping an animal outdoors is only a partial solution, since homes with pets in the yard still have higher concentrations of animal allergens. Before getting a pet, ask your allergist to determine if you are allergic to animals. If your pet is already considered part of your family, try to minimize contact and keep the pet out of the bedroom and other rooms where you spend a great deal of time. As with dust mites, vacuum carpets often or replace carpet with a hardwood floor, tile or linoleum. High-efficiency particulate air (HEPA) cleaners can reduce allergen levels over time. While dander and saliva are the source of cat and dog allergens, urine is the source of allergens from rabbits, hamsters, mice and [place] pigs; so ask a non-allergic family member to clean the animal's cage. If you have a pet allergy, talk to your allergist about the potential for allergy immunotherapy (allergy shots). This strategy can often provide long-term relief.

## 2022-02-16 NOTE — Assessment & Plan Note (Signed)
Past history - 12/08/2021 cbc diff showed eos of 3300. No recent eos to compare to. Last eos in 2017 was 230. Patient had about 3lbs weight loss the last 1-2 months but also not eating as much and exercising less as he has not been feeling well with his bouts of bronchitis and Covid-19. Also taking more NSAIDs. Started aspirin in spring 2023.  Interim history - saw hematology and no further work up needed. 02/05/2022 eos 900.  Currently on prednisone. Will recheck cbc diff at next visit.

## 2022-02-16 NOTE — Assessment & Plan Note (Signed)
Past history - Some mild symptoms in the spring and fall. Tried zyrtec with no benefit. Skin testing in 2006 was positive to mold per patient. No prior AIT. 2023 skin prick testing was negative to indoor/outdoor allergens. 2023 environmental panel borderline positive to dog and cat.  See below for environmental control measures.  Use Flonase (fluticasone) or Nasacort nasal spray 1 spray per nostril twice a day as needed for nasal congestion.

## 2022-02-16 NOTE — Assessment & Plan Note (Signed)
Past history - Avoiding gluten due to gluten intolerance - improved eczema and GI symptoms with avoidance. Noted brain fog and headaches with legumes/tree nut ingestion.  Interim history - no issues with legumes.  Reintroduce foods one by one.

## 2022-02-16 NOTE — Assessment & Plan Note (Signed)
Past history - Noted shortness of breath with swimming and first mile running. Sometimes has coughing and wheezing at the end. Runs 6 miles consistently. Denies any prior asthma diagnosis or inhaler use. 2023 spirometry was normal. Interim history - currently on prednisone as he had a respiratory infection which flared his asthma symptoms. Running 5 miles now. Stopped Singulair 1 month ago. Monitor prednisone use. Daily controller medication(s): continue Symbicort 161mcg 2 puffs twice a day with spacer and rinse mouth afterwards. May use Airsupra rescue inhaler 2 puffs every 4 to 6 hours as needed for shortness of breath, chest tightness, coughing, and wheezing. Do not use more than 12 puffs in 24 hours. May use Airsupra rescue inhaler 2 puffs 5 to 15 minutes prior to strenuous physical activities. Monitor frequency of use. Rinse mouth after each use.  Sample given.  If Colin Ina not covered then go back to use your regular rescue inhaler.  During respiratory infections/flares:  Pretreat with levo/albuterol or Airsupra 2 puffs or albuterol nebulizer.  If you need to use your albuterol nebulizer machine back to back within 15-30 minutes with no relief then please go to the ER/urgent care for further evaluation.  Get spirometry at next visit. Keep follow up with pulmonology - on 2L oxygen at night now. If asthma symptoms flare again, will consider adding on biologics next.

## 2022-03-02 ENCOUNTER — Encounter: Payer: Self-pay | Admitting: Family Medicine

## 2022-03-19 ENCOUNTER — Encounter: Payer: Self-pay | Admitting: Allergy

## 2022-03-23 ENCOUNTER — Ambulatory Visit (INDEPENDENT_AMBULATORY_CARE_PROVIDER_SITE_OTHER): Payer: Medicare Other | Admitting: Pulmonary Disease

## 2022-03-23 ENCOUNTER — Encounter (HOSPITAL_BASED_OUTPATIENT_CLINIC_OR_DEPARTMENT_OTHER): Payer: Self-pay | Admitting: Pulmonary Disease

## 2022-03-23 VITALS — BP 128/78 | HR 64 | Ht 65.0 in | Wt 153.6 lb

## 2022-03-23 DIAGNOSIS — G4734 Idiopathic sleep related nonobstructive alveolar hypoventilation: Secondary | ICD-10-CM | POA: Diagnosis not present

## 2022-03-23 DIAGNOSIS — D7219 Other eosinophilia: Secondary | ICD-10-CM

## 2022-03-23 LAB — CBC WITH DIFFERENTIAL/PLATELET
Basophils Absolute: 0.1 10*3/uL (ref 0.0–0.2)
Basos: 2 %
EOS (ABSOLUTE): 0.7 10*3/uL — ABNORMAL HIGH (ref 0.0–0.4)
Eos: 13 %
Hematocrit: 45.5 % (ref 37.5–51.0)
Hemoglobin: 14.8 g/dL (ref 13.0–17.7)
Immature Grans (Abs): 0 10*3/uL (ref 0.0–0.1)
Immature Granulocytes: 1 %
Lymphocytes Absolute: 2 10*3/uL (ref 0.7–3.1)
Lymphs: 34 %
MCH: 28.7 pg (ref 26.6–33.0)
MCHC: 32.5 g/dL (ref 31.5–35.7)
MCV: 88 fL (ref 79–97)
Monocytes Absolute: 0.5 10*3/uL (ref 0.1–0.9)
Monocytes: 9 %
Neutrophils Absolute: 2.5 10*3/uL (ref 1.4–7.0)
Neutrophils: 41 %
Platelets: 397 10*3/uL (ref 150–450)
RBC: 5.16 x10E6/uL (ref 4.14–5.80)
RDW: 13.2 % (ref 11.6–15.4)
WBC: 5.8 10*3/uL (ref 3.4–10.8)

## 2022-03-23 NOTE — Patient Instructions (Signed)
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

## 2022-03-23 NOTE — Progress Notes (Unsigned)
Subjective:   PATIENT ID: Eric [name] GENDER: male DOB: February 20, 1955, MRN: [id number]  Chief Complaint  Patient presents with   Follow-up    Feeling better    Reason for Visit: Follow-up  [name] is a 67 year old male with asthma, allergic rhinitis, CAD, HLD, GERD who presents for follow-up.  Initial consult He previously had exercise-induced asthma in late adulthood. He is active at baseline however began having recurrent asthmatic bronchitis flares.  [facility] Note from 12/10/21 reviewed. Has had worsening DOE and cough in the last two months since having acute bronchitis in September and then COVID-19 in October treated with Paxlovid. Had recurrent flare at the end of October and again in the beginning of November.  He is on high dose Symbicort, xopenex, singulair. Every time he completes a steroid taper, his symptoms return. He has some chest tightness, cough with deep inspiration. Denies nocturnal symptoms. Has tachycardia at times and sleep issues.   Previously treated with allergies related to leaves etc however recent (June 2023) allergy panel negative. Reports some gluten intolerance.  01/16/22 He reports he is compliant with his Advair. Able to exercise regularly and not needing albuterol. He does have some chest tightness at night. He does notice that sometimes he feels restricted while running. Sometimes will wake up at night with headache. This seemed to have started after COVID. Wife reports snoring at night. Denies withnessed apnea. He naps daily. Falls asleep easily.   03/23/22 He reports a cold at the end of December requiring steroids taper and Symbicort. This has resolved. Denies current shortness of breath, cough or wheezing. However he reports after receiving the covid booster on 03/02/22 he developed chest tightness x 2 weeks. He is not currently on any maintenance inhalers. Only needs albuterol inhaler. He is  still exercising. He has been compliant with oxygen nightly.  Social History: Negligible smoking history. 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     Family History  Problem Relation Age of Onset   Hypertension Mother    Breast cancer Mother    Hearing loss Mother    Hyperlipidemia Father    Hypertension Father    Dementia Father    Heart disease Father    Prostate cancer Father    Heart attack Father        Paternal uncle also had MI   Depression Father    Obesity Father    Alcohol abuse Sister    Allergic rhinitis Brother    Hypertension Brother    Alcohol abuse Brother    Coronary artery disease Paternal Uncle    Heart attack Paternal Uncle    Heart attack Paternal Uncle    Coronary artery disease Paternal Uncle    Heart disease Paternal Grandfather  Angioedema Other    Eczema Other    Colon cancer Neg Hx    Colon polyps Neg Hx    Pancreatic cancer Neg Hx    Rectal cancer Neg Hx    Stomach cancer Neg Hx      Social History   Occupational History   Occupation: Freight forwarder    Comment: Sports administrator  Tobacco Use   Smoking status: Former    Packs/day: 1.00    Years: 2.00    Total pack years: 2.00    Types: Cigarettes    Quit date: 07/04/1971    Years since quitting: 50.7    Passive exposure: Never   Smokeless tobacco: Never  Vaping Use   Vaping Use: Never used  Substance and Sexual Activity   Alcohol use:  Yes    Alcohol/week: 7.0 standard drinks of alcohol    Types: 7 Glasses of wine per week   Drug use: No   Sexual activity: Not Currently    Partners: Female    Birth control/protection: Surgical    Comment: vasectomy    Allergies  Allergen Reactions   Gluten Meal Diarrhea and Other (See Comments)    GI     Outpatient Medications Prior to Visit  Medication Sig Dispense Refill   albuterol (VENTOLIN HFA) 108 (90 Base) MCG/ACT inhaler Inhale 2 puffs into the lungs every 4 (four) hours as needed for wheezing or shortness of breath (coughing fits). 18 g 1   ASPIRIN 81 PO Take 1 tablet by mouth daily as needed.     atorvastatin (LIPITOR) 80 MG tablet Take 1 tablet (80 mg total) by mouth at bedtime. 90 tablet 3   b complex vitamins capsule Take 1 capsule by mouth daily.     COD LIVER OIL PO Take by mouth.     Omeprazole 20 MG TBEC Take 40 mg by mouth.     OXYGEN Inhale 2 L into the lungs at bedtime.     Probiotic Product (PROBIOTIC ADVANCED PO) Take by mouth.     Spacer/Aero-Holding Chambers (AEROCHAMBER MV) inhaler Use as instructed 1 each 2   TH MAGNESIUM PO Take by mouth.     albuterol (PROVENTIL) (2.5 MG/3ML) 0.083% nebulizer solution Take 3 mLs (2.5 mg total) by nebulization every 4 (four) hours as needed for wheezing or shortness of breath (coughing fits). (Patient not taking: Reported on 03/23/2022) 75 mL 1   Albuterol-Budesonide (AIRSUPRA) 90-80 MCG/ACT AERO Inhale 2 puffs into the lungs every 4 (four) hours as needed (coughing, wheezing, chest tightness). Do not exceed 12 puffs in 24 hours. (Patient not taking: Reported on 03/23/2022) 10.7 g 2   budesonide-formoterol (SYMBICORT) 160-4.5 MCG/ACT inhaler Inhale 2 puffs into the lungs 2 (two) times daily. (Patient not taking: Reported on 03/23/2022) 1 each 5   No facility-administered medications prior to visit.    Review of Systems  Constitutional:  Negative for chills, diaphoresis, fever, malaise/fatigue and weight loss.  HENT:   Negative for congestion.   Respiratory:  Negative for cough, hemoptysis, sputum production, shortness of breath and wheezing.   Cardiovascular:  Negative for chest pain (chest tightness), palpitations and leg swelling.       Chest tightness      Objective:   Vitals:   03/23/22 0900  BP: 128/78  Pulse: 64  SpO2: 91%  Weight: 153 lb 9.6 oz (69.7 kg)  Height: 5' 5"$  (1.651 m)   SpO2: 91 % (wearing a mask) O2 Device: None (Room air)  Physical Exam: General:  Well-appearing, no acute distress HENT: [place], AT Eyes: EOMI, no scleral icterus Respiratory: Clear to auscultation bilaterally.  No crackles, wheezing or rales Cardiovascular: RRR, -M/R/G, no JVD Extremities:-Edema,-tenderness Neuro: AAO x4, CNII-XII grossly intact Psych: Normal mood, normal affect  Data Reviewed:  Imaging: CT Cardiac 07/28/21 - Visualized lung parenchyma with no pulmonary nodules, masses, infiltrate, effusion or pneumothorax. CXR 12/08/21 - Mild bronchitic wall thickening  PFT: Spirometry 07/22/21 FVC 3.94 (104%) FEV1 2.93 (104%) Ratio 74%  Labs: CBC    Component Value Date/Time   WBC 6.7 02/05/2022 1052   RBC 5.23 02/05/2022 1052   HGB 15.5 02/05/2022 1052   HGB 15.7 01/13/2022 1058   HGB 16.2 12/24/2021 1123   HCT 46.8 02/05/2022 1052   HCT 47.4 12/24/2021 1123   PLT 267.0 02/05/2022 1052   PLT 325 01/13/2022 1058   PLT 313 12/24/2021 1123   MCV 89.4 02/05/2022 1052   MCV 87 12/24/2021 1123   MCH 29.3 01/13/2022 1058   MCHC 33.0 02/05/2022 1052   RDW 14.7 02/05/2022 1052   RDW 13.5 12/24/2021 1123   LYMPHSABS 1.4 02/05/2022 1052   LYMPHSABS 1.9 12/24/2021 1123   MONOABS 0.8 02/05/2022 1052   EOSABS 0.9 (H) 02/05/2022 1052   EOSABS 0.4 12/24/2021 1123   BASOSABS 0.1 02/05/2022 1052   BASOSABS 0.1 12/24/2021 1123   Absolute eos  12/08/21 - 3300 12/19/21 - 500 12/24/21 - 400 01/13/22 - 300 02/05/22 - 900  IgE 12/24/21 - 201    Assessment & Plan:   Discussion: 67 year old male  with asthma, allergic rhinitis, CAD, HLD, GERD who presents for follow-up. History of multiple exacerbations in setting of severe peripheral eosinophilia from Sept to Nov 2023. Peak abs eos 3300. Seen by Hematology with thought this is secondary to acute COVID. Recent peripheral eosinophilia improved to 300-400 range.  Currently off inhalers. He still has symptoms that sound asthma-like however prefers to de-escalate inhalers to PRN SABA which he feels is overall well controlled. Recently had chest tightness after covid vaccination that has now resolved.  He has nocturnal hypoxemia which may be due to his recent viral infection however has low-normal O2 at rest in the office. We discussed further evaluation including sleep study. His phenotype is atypical for OSA but will rule out in setting of snoring. If sleep study negative will need to consider high resolution CT and PFTs with DLCO. Patient would like to pursue scuba diving but will need to hold on this until the etiology of his hypoxemia can be better understood.  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 Please hold on scuba diving until then --Continue 1L oxygen nightly   Health Maintenance Immunization History  Administered Date(s) Administered   Influenza Split 12/23/2011, 11/28/2018, 11/21/2019   Influenza,inj,Quad PF,6+ Mos 01/17/2014, 12/11/2014, 12/11/2015, 12/11/2016, 11/14/2020   Moderna Covid-19 Vaccine Bivalent Booster 71yr & up 10/21/2020   Moderna Sars-Covid-2 Vaccination 05/23/2020   PFIZER(Purple Top)SARS-COV-2 Vaccination 04/09/2019, 05/02/2019, 11/21/2019, 05/23/2020   PNEUMOCOCCAL CONJUGATE-20 09/09/2021   Pfizer Covid-19 Vaccine Bivalent Booster 158yr& up 10/21/2020   Td 05/15/2008   CT Lung Screen - not qualified  Orders Placed This Encounter  Procedures   CBC  w/Diff   Home sleep test    Standing Status:   Future    Standing Expiration Date:   03/24/2023    Order Specific Question:   Where should  this test be performed:    Answer:   [facility]   No orders of the defined types were placed in this encounter.   Return in about 3 months (around 06/21/2022).  I have spent a total time of 36-minutes on the day of the appointment including chart review, data review, collecting history, coordinating care and discussing medical diagnosis and plan with the patient/family. Past medical history, allergies, medications were reviewed. Pertinent imaging, labs and tests included in this note have been reviewed and interpreted independently by me.  [place], MD [facility] 03/23/2022 9:15 AM  Office Number [phone]

## 2022-03-25 ENCOUNTER — Encounter (HOSPITAL_BASED_OUTPATIENT_CLINIC_OR_DEPARTMENT_OTHER): Payer: Self-pay | Admitting: Pulmonary Disease

## 2022-03-26 ENCOUNTER — Encounter (HOSPITAL_BASED_OUTPATIENT_CLINIC_OR_DEPARTMENT_OTHER): Payer: Self-pay | Admitting: Pulmonary Disease

## 2022-03-27 NOTE — Telephone Encounter (Signed)
Dr. Loanne Drilling, please advise on pt's messages. Thanks.

## 2022-04-01 NOTE — Telephone Encounter (Signed)
Please advise, on new issues

## 2022-04-02 NOTE — Telephone Encounter (Signed)
Pt said that he will try the [place]. Routing to Dr. Loanne Drilling.

## 2022-04-06 ENCOUNTER — Telehealth: Payer: Self-pay | Admitting: Pulmonary Disease

## 2022-04-06 NOTE — Telephone Encounter (Signed)
Pt returning [place] call in regards to getting scheduled for HST

## 2022-04-14 ENCOUNTER — Encounter (HOSPITAL_BASED_OUTPATIENT_CLINIC_OR_DEPARTMENT_OTHER): Payer: Medicare Other

## 2022-04-14 ENCOUNTER — Encounter (HOSPITAL_BASED_OUTPATIENT_CLINIC_OR_DEPARTMENT_OTHER): Payer: Self-pay

## 2022-04-19 NOTE — Progress Notes (Signed)
Follow Up Note  RE: Eric [name] MRN: [id number] DOB: 1955/08/03 Date of Office Visit: 04/20/2022  Referring provider: Ma Hillock, DO Primary care provider: Ma Hillock, DO  Chief Complaint: No chief complaint on file.  History of Present Illness: I had the pleasure of seeing Eric [name] for a follow up visit at the [facility] on 04/19/2022. He is a 67 y.o. male, who is being followed for asthma, peripheral eosinophilia, chronic rhinitis. His previous allergy office visit was on 02/16/2022 with Dr. Maudie Mercury. Today is a regular follow up visit.  Asthma Past history - Noted shortness of breath with swimming and first mile running. Sometimes has coughing and wheezing at the end. Runs 6 miles consistently. Denies any prior asthma diagnosis or inhaler use. 2023 spirometry was normal. Interim history - currently on prednisone as he had a respiratory infection which flared his asthma symptoms. Running 5 miles now. Stopped Singulair 1 month ago. Monitor prednisone use. Daily controller medication(s): continue Symbicort 141mcg 2 puffs twice a day with spacer and rinse mouth afterwards. May use Airsupra rescue inhaler 2 puffs every 4 to 6 hours as needed for shortness of breath, chest tightness, coughing, and wheezing. Do not use more than 12 puffs in 24 hours. May use Airsupra rescue inhaler 2 puffs 5 to 15 minutes prior to strenuous physical activities. Monitor frequency of use. Rinse mouth after each use.  Sample given.  If Eric [name] not covered then go back to use your regular rescue inhaler.  During respiratory infections/flares:  Pretreat with levo/albuterol or Airsupra 2 puffs or albuterol nebulizer.  If you need to use your albuterol nebulizer machine back to back within 15-30 minutes with no relief then please go to the ER/urgent care for further evaluation.  Get spirometry at next visit. Keep follow up with pulmonology - on 2L oxygen at night now. If asthma symptoms  flare again, will consider adding on biologics next.    Peripheral eosinophilia Past history - 12/08/2021 cbc diff showed eos of 3300. No recent eos to compare to. Last eos in 2017 was 230. Patient had about 3lbs weight loss the last 1-2 months but also not eating as much and exercising less as he has not been feeling well with his bouts of bronchitis and Covid-19. Also taking more NSAIDs. Started aspirin in spring 2023.  Interim history - saw hematology and no further work up needed. 02/05/2022 eos 900.  Currently on prednisone. Will recheck cbc diff at next visit.   Chronic rhinitis Past history - Some mild symptoms in the spring and fall. Tried zyrtec with no benefit. Skin testing in 2006 was positive to mold per patient. No prior AIT. 2023 skin prick testing was negative to indoor/outdoor allergens. 2023 environmental panel borderline positive to dog and cat.  See below for environmental control measures.  Use Flonase (fluticasone) or Nasacort nasal spray 1 spray per nostril twice a day as needed for nasal congestion.    Dietary counseling and surveillance Past history - Avoiding gluten due to gluten intolerance - improved eczema and GI symptoms with avoidance. Noted brain fog and headaches with legumes/tree nut ingestion.  Interim history - no issues with legumes.  Reintroduce foods one by one.    Elevated blood pressure reading Monitor at home, if consistently elevated (above 130/85) then please follow up with PCP.    Return in about 2 months (around 04/17/2022). Okay to get Covid-19 booster first, then flu, then RSV, then Shingles.  03/23/2022 pulm visit: "Discussion: 67 year old male with asthma, allergic rhinitis, CAD, HLD, GERD who presents for follow-up. History of multiple exacerbations in setting of severe peripheral eosinophilia from Sept to Nov 2023. Peak abs eos 3300. Seen by Hematology with thought this is secondary to acute COVID. Recent peripheral eosinophilia improved to  300-400 range.   Currently off inhalers. He still has symptoms that sound asthma-like however prefers to de-escalate inhalers to PRN SABA which he feels is overall well controlled. Recently had chest tightness after covid vaccination that has now resolved.   He has nocturnal hypoxemia which may be due to his recent viral infection however has low-normal O2 at rest in the office. We discussed further evaluation including sleep study. His phenotype is atypical for OSA but will rule out in setting of snoring. If sleep study negative will need to consider high resolution CT and PFTs with DLCO. Patient would like to pursue scuba diving but will need to hold on this until the etiology of his hypoxemia can be better understood.   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 Please hold on scuba diving until then"  Assessment and Plan: Eric [name] is a 67 y.o. male with: No problem-specific Assessment & Plan notes found for this encounter.  No follow-ups on file.  No orders of the defined types were placed in this encounter.  Lab Orders  No laboratory test(s) ordered today    Diagnostics: Spirometry:  Tracings reviewed. His effort: {Blank single:19197::"Good reproducible efforts.","It was hard to get consistent efforts and there is a question as to whether this reflects a maximal maneuver.","Poor effort, data can not be interpreted."} FVC: ***L FEV1: ***L, ***% predicted FEV1/FVC ratio: ***% Interpretation: {Blank single:19197::"Spirometry consistent with mild obstructive disease","Spirometry consistent with moderate obstructive disease","Spirometry consistent with severe obstructive disease","Spirometry consistent with possible restrictive disease","Spirometry consistent with mixed obstructive and restrictive disease","Spirometry uninterpretable due to  technique","Spirometry consistent with normal pattern","No overt abnormalities noted given today's efforts"}.  Please see scanned spirometry results for details.  Skin Testing: {Blank single:19197::"Select foods","Environmental allergy panel","Environmental allergy panel and select foods","Food allergy panel","None","Deferred due to recent antihistamines use"}. *** Results discussed with patient/family.   Medication List:  Current Outpatient Medications  Medication Sig Dispense Refill   albuterol (PROVENTIL) (2.5 MG/3ML) 0.083% nebulizer solution Take 3 mLs (2.5 mg total) by nebulization every 4 (four) hours as needed for wheezing or shortness of breath (coughing fits). (Patient not taking: Reported on 03/23/2022) 75 mL 1   albuterol (VENTOLIN HFA) 108 (90 Base) MCG/ACT inhaler Inhale 2 puffs into the lungs every 4 (four) hours as needed for wheezing or shortness of breath (coughing fits). 18 g 1   ASPIRIN 81 PO Take 1 tablet by mouth daily as needed.     atorvastatin (LIPITOR) 80 MG tablet Take 1 tablet (80 mg total) by mouth at bedtime. 90 tablet 3   b complex vitamins capsule Take 1 capsule by mouth daily.     COD LIVER OIL PO Take by mouth.     Omeprazole 20 MG TBEC Take 40 mg by mouth.     OXYGEN Inhale 2 L into the lungs at bedtime.     Probiotic Product (PROBIOTIC ADVANCED PO) Take by mouth.     Spacer/Aero-Holding Chambers (AEROCHAMBER MV) inhaler Use as instructed 1 each 2   TH MAGNESIUM PO Take by mouth.     No  current facility-administered medications for this visit.   Allergies: Allergies  Allergen Reactions   Gluten Meal Diarrhea and Other (See Comments)    GI   I reviewed his past medical history, social history, family history, and environmental history and no significant changes have been reported from his previous visit.  Review of Systems  Constitutional:  Negative for appetite change, chills, fever and unexpected weight change.  HENT:  Negative for congestion and  rhinorrhea.   Eyes:  Negative for itching.  Respiratory:  Positive for cough. Negative for chest tightness, shortness of breath and wheezing.   Gastrointestinal:  Negative for abdominal pain.  Skin:  Negative for rash.  Neurological:  Negative for headaches.    Objective: There were no vitals taken for this visit. There is no height or weight on file to calculate BMI. Physical Exam Vitals and nursing note reviewed.  Constitutional:      Appearance: Normal appearance. He is well-developed.  HENT:     Head: Normocephalic and atraumatic.     Right Ear: Tympanic membrane and external ear normal.     Left Ear: Tympanic membrane and external ear normal.     Nose: Nose normal.     Mouth/Throat:     Mouth: Mucous membranes are moist.     Pharynx: Oropharynx is clear.  Eyes:     Conjunctiva/sclera: Conjunctivae normal.  Cardiovascular:     Rate and Rhythm: Normal rate and regular rhythm.     Heart sounds: Normal heart sounds. No murmur heard. Pulmonary:     Effort: Pulmonary effort is normal.     Breath sounds: Normal breath sounds. No wheezing, rhonchi or rales.  Musculoskeletal:     Cervical back: Neck supple.  Skin:    General: Skin is warm.     Findings: No rash.  Neurological:     Mental Status: He is alert and oriented to person, place, and time.  Psychiatric:        Behavior: Behavior normal.    Previous notes and tests were reviewed. The plan was reviewed with the patient/family, and all questions/concerned were addressed.  It was my pleasure to see Eric [name] today and participate in his care. Please feel free to contact me with any questions or concerns.  Sincerely,  Rexene Alberts, DO Allergy & Immunology  [facility] office: [place] office: [phone]

## 2022-04-20 ENCOUNTER — Telehealth: Payer: Self-pay | Admitting: Allergy

## 2022-04-20 ENCOUNTER — Other Ambulatory Visit: Payer: Self-pay

## 2022-04-20 ENCOUNTER — Ambulatory Visit (INDEPENDENT_AMBULATORY_CARE_PROVIDER_SITE_OTHER): Payer: Medicare Other | Admitting: Allergy

## 2022-04-20 ENCOUNTER — Encounter: Payer: Self-pay | Admitting: Allergy

## 2022-04-20 ENCOUNTER — Telehealth: Payer: Self-pay

## 2022-04-20 VITALS — BP 130/86 | HR 66 | Temp 98.1°F | Resp 16 | Ht 65.0 in | Wt 156.0 lb

## 2022-04-20 DIAGNOSIS — K219 Gastro-esophageal reflux disease without esophagitis: Secondary | ICD-10-CM

## 2022-04-20 DIAGNOSIS — J4540 Moderate persistent asthma, uncomplicated: Secondary | ICD-10-CM

## 2022-04-20 DIAGNOSIS — J310 Chronic rhinitis: Secondary | ICD-10-CM

## 2022-04-20 DIAGNOSIS — D7219 Other eosinophilia: Secondary | ICD-10-CM

## 2022-04-20 MED ORDER — BUDESONIDE-FORMOTEROL FUMARATE 80-4.5 MCG/ACT IN AERO: 2.0000 | INHALATION_SPRAY | Freq: Two times a day (BID) | RESPIRATORY_TRACT | 3 refills | Status: DC

## 2022-04-20 MED ORDER — BUDESONIDE-FORMOTEROL FUMARATE 160-4.5 MCG/ACT IN AERO: 1.0000 | INHALATION_SPRAY | Freq: Two times a day (BID) | RESPIRATORY_TRACT | 3 refills | Status: DC

## 2022-04-20 NOTE — Assessment & Plan Note (Signed)
Past history - Noted shortness of breath with swimming and first mile running. Sometimes has coughing and wheezing at the end. Runs 6 miles consistently. Denies any prior asthma diagnosis or inhaler use. 2023 spirometry was normal. Interim history - stopped Symbicort and about 6 weeks ago 1 day after his Covid-19 booster noted coughing and wheezing at times. Airsupra not covered.  Today's spirometry was normal but worse than previous one. I question if stopping the Symbicort is what causing his coughing and wheezing and the vaccine was just coincidental. Hold off any scuba diving as his respiratory status is not fully controlled. Daily controller medication(s): start Symbicort 152mcg 1 puff twice a day with spacer and rinse mouth afterwards. (Patient requesting the 17mcg dose as it's cheaper than 45mcg 2 puffs BID).  During respiratory infections/flares:  May use levoalbuterol rescue inhaler 2 puffs every 4 to 6 hours as needed for shortness of breath, chest tightness, coughing, and wheezing. May use albuterol rescue inhaler 2 puffs 5 to 15 minutes prior to strenuous physical activities. Monitor frequency of use.  Keep follow up with pulmonology.

## 2022-04-20 NOTE — Telephone Encounter (Signed)
That's fine. I'll send in the Symbicort 145mcg

## 2022-04-20 NOTE — Assessment & Plan Note (Signed)
Controlled with PPI. Continue lifestyle and dietary modifications. Continue omeprazole 40mg  daily.

## 2022-04-20 NOTE — Patient Instructions (Addendum)
Breathing Daily controller medication(s): start Symbicort 130mcg 1 puff twice a day with spacer and rinse mouth afterwards. During respiratory infections/flares:  May use levoalbuterol rescue inhaler 2 puffs every 4 to 6 hours as needed for shortness of breath, chest tightness, coughing, and wheezing. May use albuterol rescue inhaler 2 puffs 5 to 15 minutes prior to strenuous physical activities. Monitor frequency of use.  Breathing control goals:  Full participation in all desired activities (may need albuterol before activity) Albuterol use two times or less a week on average (not counting use with activity) Cough interfering with sleep two times or less a month Oral steroids no more than once a year No hospitalizations   Eosinophils No indication to recheck today.   Rhinitis 2023 skin testing was negative to indoor/outdoor allergens.  2023 bloodwork was borderline to cat and dog. See below for environmental control measures.  Use Flonase (fluticasone) or Nasacort nasal spray 1 spray per nostril twice a day as needed for nasal congestion.  Recommend ENT evaluation next.   Food Reintroduce foods one by one.   Follow up in 3 months or sooner if needed. Keep follow up with pulmonology.   Pet Allergen Avoidance: Contrary to popular opinion, there are no "hypoallergenic" breeds of dogs or cats. That is because people are not allergic to an animal's hair, but to an allergen found in the animal's saliva, dander (dead skin flakes) or urine. Pet allergy symptoms typically occur within minutes. For some people, symptoms can build up and become most severe 8 to 12 hours after contact with the animal. People with severe allergies can experience reactions in public places if dander has been transported on the pet owners' clothing. Keeping an animal outdoors is only a partial solution, since homes with pets in the yard still have higher concentrations of animal allergens. Before getting a pet, ask  your allergist to determine if you are allergic to animals. If your pet is already considered part of your family, try to minimize contact and keep the pet out of the bedroom and other rooms where you spend a great deal of time. As with dust mites, vacuum carpets often or replace carpet with a hardwood floor, tile or linoleum. High-efficiency particulate air (HEPA) cleaners can reduce allergen levels over time. While dander and saliva are the source of cat and dog allergens, urine is the source of allergens from rabbits, hamsters, mice and [place] pigs; so ask a non-allergic family member to clean the animal's cage. If you have a pet allergy, talk to your allergist about the potential for allergy immunotherapy (allergy shots). This strategy can often provide long-term relief.

## 2022-04-20 NOTE — Assessment & Plan Note (Signed)
Past history - Some mild symptoms in the spring and fall. Tried zyrtec with no benefit. Skin testing in 2006 was positive to mold per patient. No prior AIT. 2023 skin prick testing was negative to indoor/outdoor allergens. 2023 environmental panel borderline positive to dog and cat.  Interim history - some nasal congestion. Continue environmental control measures.  Use Flonase (fluticasone) or Nasacort nasal spray 1 spray per nostril twice a day as needed for nasal congestion.  Recommend ENT evaluation next to look at sinus anatomy.

## 2022-04-20 NOTE — Telephone Encounter (Signed)
Per Dr. Maudie Mercury: Please refer to ENT for chronic nasal congestion

## 2022-04-20 NOTE — Telephone Encounter (Signed)
Patient ask if he can do the budesonide-formoterol 160 one puff twice daily versus the 80 2 puffs twice daily. He states this would be more cost effective.

## 2022-04-20 NOTE — Assessment & Plan Note (Signed)
Past history - 12/08/2021 cbc diff showed eos of 3300. No recent eos to compare to. Last eos in 2017 was 230. Patient had about 3lbs weight loss the last 1-2 months but also not eating as much and exercising less as he has not been feeling well with his bouts of bronchitis and Covid-19. Also taking more NSAIDs. Started aspirin in spring 2023. Heme eval done - no further f/u needed. Interim history - 03/23/22 eos 700.  No need to repeat today.

## 2022-04-25 ENCOUNTER — Encounter: Payer: Self-pay | Admitting: Allergy

## 2022-04-29 ENCOUNTER — Ambulatory Visit: Payer: Medicare Other

## 2022-04-29 DIAGNOSIS — G4734 Idiopathic sleep related nonobstructive alveolar hypoventilation: Secondary | ICD-10-CM

## 2022-04-29 DIAGNOSIS — G4733 Obstructive sleep apnea (adult) (pediatric): Secondary | ICD-10-CM

## 2022-05-04 DIAGNOSIS — G4733 Obstructive sleep apnea (adult) (pediatric): Secondary | ICD-10-CM

## 2022-05-06 DIAGNOSIS — G4733 Obstructive sleep apnea (adult) (pediatric): Secondary | ICD-10-CM | POA: Insufficient documentation

## 2022-05-06 HISTORY — DX: Obstructive sleep apnea (adult) (pediatric): G47.33

## 2022-05-13 ENCOUNTER — Telehealth (HOSPITAL_BASED_OUTPATIENT_CLINIC_OR_DEPARTMENT_OTHER): Payer: Self-pay | Admitting: Pulmonary Disease

## 2022-05-13 DIAGNOSIS — G4733 Obstructive sleep apnea (adult) (pediatric): Secondary | ICD-10-CM

## 2022-05-13 NOTE — Telephone Encounter (Signed)
Desaturations associated with apneic episodes. CPAP alone should correct this so he would not need oxygen bled in. Can discuss this in the future at our next clinic visit in May.  Please communicate the above information to patient. No additional testing needs to be ordered at this time.

## 2022-05-13 NOTE — Telephone Encounter (Signed)
ATC pt. LVMTCB.  

## 2022-05-13 NOTE — Telephone Encounter (Signed)
Attempted to contact patient regarding home sleep study. No answer. Left brief message to call back.  If patient returns call, please communicate the following:  Home sleep test 05/04/22 - AHI 23.4 with nadir SpO2 76% on room air Interpretation: Moderate sleep apnea with desaturations  Assessment Moderate obstructive sleep apnea  Plan Recommend treatment with CPAP device. Alternative options include an oral appliance or Inspire device. If he agrees to pursue CPAP, ok to order autoCPAP 5-15 cm H20 with supplies If he is interested in oral appliance please refer to dentist (Dr. Althea Grimmer at [phone]) If he is interested in [place] device, place referral to ENT  Also, discussed his condition with the Sleep physicians in our department who all agree safe for him to proceed with scuba diving even before starting treatment with CPAP for his OSA. No restrictions in activity.

## 2022-05-13 NOTE — Telephone Encounter (Signed)
Patient is returning phone call. Patient phone number is [phone].

## 2022-05-13 NOTE — Telephone Encounter (Signed)
Pt called the office and I let him know the results of recent sleep study and also the info letting him know that it would be okay for him to still scuba dive prior to receiving the cpap and he verbalized understanding.  Order has been placed for the cpap start.  Pt did mention that he does currently have oxygen at home which was needed based off of recent ONO that was done.  Pt wants to know if he will need to have the oxygen bled in with the cpap once he receives the cpap or if he will be okay with just the cpap by itself.   Dr. Everardo All, please advise if you would want an ONO to be ordered once pt does receive the cpap to see if he needs to have the oxygen bled in with the machine or just have pt wear the cpap by itself at night?

## 2022-05-14 NOTE — Telephone Encounter (Signed)
Called and spoke with patient. Patient verbalized understanding. Nothing further needed.  

## 2022-05-26 ENCOUNTER — Telehealth: Payer: Self-pay

## 2022-05-26 ENCOUNTER — Encounter (HOSPITAL_BASED_OUTPATIENT_CLINIC_OR_DEPARTMENT_OTHER): Payer: Self-pay | Admitting: Pulmonary Disease

## 2022-05-26 NOTE — Telephone Encounter (Signed)
Received faxed ENT SOAP Note from Dr. Suszanne Conners - placed in Dr. Elmyra Ricks in basket for review.  Forwarding message to provider as update.

## 2022-05-27 NOTE — Telephone Encounter (Signed)
Reviewed notes from Dr. Suszanne Conners. Date of service: 05/19/2021. See scanned notes for full documentation. Chronic rhinitis with nasal mucosal congestion, nasal septal deviation and bilateral inferior turbinate hypertrophy.  No polyps.Marland Kitchen

## 2022-06-26 ENCOUNTER — Encounter (HOSPITAL_BASED_OUTPATIENT_CLINIC_OR_DEPARTMENT_OTHER): Payer: Self-pay | Admitting: Pulmonary Disease

## 2022-06-26 ENCOUNTER — Ambulatory Visit (INDEPENDENT_AMBULATORY_CARE_PROVIDER_SITE_OTHER): Payer: Medicare Other | Admitting: Pulmonary Disease

## 2022-06-26 VITALS — BP 126/80 | HR 57 | Temp 97.9°F | Ht 65.0 in | Wt 155.4 lb

## 2022-06-26 DIAGNOSIS — G4734 Idiopathic sleep related nonobstructive alveolar hypoventilation: Secondary | ICD-10-CM | POA: Diagnosis not present

## 2022-06-26 DIAGNOSIS — J4530 Mild persistent asthma, uncomplicated: Secondary | ICD-10-CM | POA: Diagnosis not present

## 2022-06-26 NOTE — Patient Instructions (Addendum)
  Moderate obstructive sleep apnea --Counseled on sleep hygiene --Counseled NOT to drive if/when sleepy --Advised patient to wear CPAP for at least 4 hours each night for greater than 70% of the time to avoid the machine being repossessed by insurance. --CONTINUE auto CPAP 5-15 cm H20  --ORDER ONO on CPAP. 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

## 2022-06-26 NOTE — Progress Notes (Signed)
Subjective:   Eric [name] ID: Eric Eric [name] GENDER: male DOB: July 29, 1955, MRN: [id number]  Chief Complaint  Eric [name] presents with   Follow-up    Follow up. Eric [name] has no complaints.     Reason for Visit: Follow-up  Eric Eric [name] is a 67 year old male with asthma, allergic rhinitis, CAD, HLD, GERD who presents for follow-up.  Initial consult He previously had exercise-induced asthma in late adulthood. He is active at baseline however began having recurrent asthmatic bronchitis flares.  [facility] Note from 12/10/21 reviewed. Has had worsening DOE and cough in Eric last two months since having acute bronchitis in September and then COVID-19 in October treated with Paxlovid. Had recurrent flare at Eric end of October and again in Eric beginning of November.  He is on high dose Symbicort, xopenex, singulair. Every time he completes a steroid taper, his symptoms return. He has some chest tightness, cough with deep inspiration. Denies nocturnal symptoms. Has tachycardia at times and sleep issues.   Previously treated with allergies related to leaves etc however recent (June 2023) allergy panel negative. Reports some gluten intolerance.  01/16/22 He reports he is compliant with his Advair. Able to exercise regularly and not needing albuterol. He does have some chest tightness at night. He does notice that sometimes he feels restricted while running. Sometimes will wake up at night with headache. This seemed to have started after COVID. Wife reports snoring at night. Denies withnessed apnea. He naps daily. Falls asleep easily.   03/23/22 He reports a cold at Eric end of December requiring steroids taper and Symbicort. This has resolved. Denies current shortness of breath, cough or wheezing. However he reports after receiving Eric covid booster on 03/02/22 he developed chest tightness x 2 weeks. He is not currently on any maintenance inhalers. Only needs  albuterol inhaler. He is still exercising. He has been compliant with oxygen nightly.  06/26/22 Since our last visit overall doing well with some nasal congestion. Using nasocort. Has been compliant with his CPAP since our last visit. Unable to tell if his sleep quality has improved. He is compliant with Symbicort. Not using rescue inhaler or nebulizer. No exacerbations since our last visit. Active at baseline. No shortness of breath, cough or wheezing  Social History: Negligible smoking history. 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     Family History  Problem Relation Age of Onset   Hypertension Mother    Breast cancer Mother    Hearing loss Mother    Hyperlipidemia Father    Hypertension Father    Dementia Father    Heart disease Father    Prostate cancer Father    Heart attack Father        Paternal uncle also had MI   Depression  Father    Obesity Father    Alcohol abuse Sister    Allergic rhinitis Brother    Hypertension Brother    Alcohol abuse Brother    Coronary artery disease Paternal Uncle    Heart attack Paternal Uncle    Heart attack Paternal Uncle    Coronary artery disease Paternal Uncle    Heart disease Paternal Grandfather    Angioedema Other    Eczema Other    Colon cancer Neg Hx    Colon polyps Neg Hx    Pancreatic cancer Neg Hx    Rectal cancer Neg Hx    Stomach cancer Neg Hx      Social History    Occupational History   Occupation: Production designer, theatre/television/film    Comment: Health and safety inspector  Tobacco Use   Smoking status: Former    Packs/day: 1.00    Years: 2.00    Additional pack years: 0.00    Total pack years: 2.00    Types: Cigarettes    Quit date: 07/04/1971    Years since quitting: 51.0    Passive exposure: Never   Smokeless tobacco: Never  Vaping Use   Vaping Use: Never used  Substance and Sexual Activity   Alcohol use: Yes    Alcohol/week: 7.0 standard drinks of alcohol    Types: 7 Glasses of wine per week   Drug use: No   Sexual activity: Not Currently    Partners: Female    Birth control/protection: Surgical    Comment: vasectomy    Allergies  Allergen Reactions   Gluten Meal Diarrhea and Other (See Comments)    GI     Outpatient Medications Prior to Visit  Medication Sig Dispense Refill   albuterol (PROVENTIL) (2.5 MG/3ML) 0.083% nebulizer solution Take 3 mLs (2.5 mg total) by nebulization every 4 (four) hours as needed for wheezing or shortness of breath (coughing fits). 75 mL 1   albuterol (VENTOLIN HFA) 108 (90 Base) MCG/ACT inhaler Inhale 2 puffs into Eric lungs every 4 (four) hours as needed for wheezing or shortness of breath (coughing fits). 18 g 1   ASPIRIN 81 PO Take 1 tablet by mouth daily as needed.     atorvastatin (LIPITOR) 80 MG tablet Take 1 tablet (80 mg total) by mouth at bedtime. 90 tablet 3   b complex vitamins capsule Take 1 capsule by mouth daily.     budesonide-formoterol (SYMBICORT) 160-4.5 MCG/ACT inhaler Inhale 1 puff into Eric lungs in Eric morning and at bedtime. with spacer and rinse mouth afterwards. 1 each 3   COD LIVER OIL PO Take by mouth.     Omeprazole 20 MG TBEC Take 40 mg by mouth.     Probiotic Product (PROBIOTIC ADVANCED PO) Take by mouth.     TH MAGNESIUM PO Take by mouth.     OXYGEN Inhale 2 L into Eric lungs at bedtime. (Eric [name] not taking: Reported on 04/20/2022)     Spacer/Aero-Holding Chambers (AEROCHAMBER MV) inhaler Use as instructed  (Eric [name] not taking: Reported on 04/20/2022) 1 each 2   No facility-administered medications prior to visit.    Review of Systems  Constitutional:  Negative for chills, diaphoresis, fever, malaise/fatigue and weight loss.  HENT:  Positive for congestion.   Respiratory:  Negative for cough, hemoptysis, sputum production, shortness of breath and wheezing.   Cardiovascular:  Negative for chest pain, palpitations and leg swelling.     Objective:   Vitals:   06/26/22 0828  BP: 126/80  Pulse: (!) 57  Temp: 97.9 F (36.6 C)  TempSrc: Oral  SpO2: 99%  Weight: 155 lb 6.4 oz (70.5 kg)  Height: 5\' 5"  (1.651 m)   SpO2: 99 % O2 Device: None (Room air)  Physical Exam: General: Well-appearing, no acute distress HENT: [place], AT Eyes: EOMI, no scleral icterus Respiratory: Clear to auscultation bilaterally.  No crackles, wheezing or rales Cardiovascular: RRR, -M/R/G, no JVD Extremities:-Edema,-tenderness Neuro: AAO x4, CNII-XII grossly intact Psych: Normal mood, normal affect  Data Reviewed:  Imaging: CT Cardiac 07/28/21 - Visualized lung parenchyma with no pulmonary nodules, masses, infiltrate, effusion or pneumothorax. CXR 12/08/21 - Mild bronchitic wall thickening  PFT: Spirometry 07/22/21 FVC 3.94 (104%) FEV1 2.93 (104%) Ratio 74%  Sleep: Home sleep test 05/04/22 - AHI 23.4 with nadir SpO2 76% on room air Interpretation: Moderate sleep apnea with desaturations  Labs: CBC    Component Value Date/Time   WBC 5.8 03/23/2022 0955   WBC 6.7 02/05/2022 1052   RBC 5.16 03/23/2022 0955   RBC 5.23 02/05/2022 1052   HGB 14.8 03/23/2022 0955   HCT 45.5 03/23/2022 0955   PLT 397 03/23/2022 0955   MCV 88 03/23/2022 0955   MCH 28.7 03/23/2022 0955   MCH 29.3 01/13/2022 1058   MCHC 32.5 03/23/2022 0955   MCHC 33.0 02/05/2022 1052   RDW 13.2 03/23/2022 0955   LYMPHSABS 2.0 03/23/2022 0955   MONOABS 0.8 02/05/2022 1052   EOSABS 0.7 (H) 03/23/2022 0955   BASOSABS 0.1 03/23/2022 0955    Absolute eos  12/08/21 - 3300 12/19/21 - 500 12/24/21 - 400 01/13/22 - 300 02/05/22 - 900 03/23/22 - 700  IgE 12/24/21 - 201  CPAP Compliance 05/27/22-06/25/22 28/30 days (93%) >4 hours 27 (90%) AHI 5.3    Assessment & Plan:   Discussion: 67 year old male with asthma, allergic rhinitis, CAD, HLD, GERD who presents for follow-up for eosinophilic asthma and OSA. History of multiple exacerbations in setting of severe peripheral eosinophilia from Sept to Nov 2023. Peak abs eos 3300. Seen by Hematology with thought this is secondary to acute COVID. Recent peripheral eosinophilia improved to 300-400 range. Most recently in 700 range however asymptomatic with PRN SABA use. Overall well-controlled asthma. Reviewed OSA management as noted below   Moderate obstructive sleep apnea Eric natural history, progression and prognosis of sleep apnea, treatment with PAP and alternative treatment strategies were discussed. Eric Eric [name] was also educated regarding Eric long term cardiovascular benefits of treating sleep apnea, including improved blood pressure control, reduction in MI and stroke risk as well as other potential benefits of treatment, such as improved glycemic control, facilitation of weight loss, improved energy during Eric day and improved sleep quality. --Counseled on sleep hygiene --Counseled NOT to drive if/when sleepy --Advised Eric [name] to wear CPAP for at least 4 hours each night for greater than 70% of Eric time to avoid Eric machine being repossessed by insurance. --CONTINUE auto CPAP 5-15 cm H20  --ORDER ONO on CPAP --DME: [facility]  Hypereosinophilia  Severe persistent asthma vs COVID long hauler - well controlled --CONTINUE Albuterol AS NEEDED --Monitor CBC with diff every 3-6 months --ORDER pulmonary function test  Health Maintenance Immunization History  Administered Date(s) Administered   Influenza Split 12/23/2011, 11/28/2018, 11/21/2019   Influenza,inj,Quad PF,6+ Mos 01/17/2014,  12/11/2014, 12/11/2015, 12/11/2016, 11/14/2020   Moderna Covid-19 Vaccine Bivalent Booster 15yrs & up 10/21/2020   Moderna Sars-Covid-2 Vaccination 05/23/2020   PFIZER(Purple Top)SARS-COV-2 Vaccination 04/09/2019, 05/02/2019, 11/21/2019, 05/23/2020   PNEUMOCOCCAL CONJUGATE-20 09/09/2021   Pfizer Covid-19 Theatre manager  11yrs & up 10/21/2020   Td 05/15/2008   CT Lung Screen - not qualified  Orders Placed This Encounter  Procedures   Pulse oximetry, overnight    While on CPAP    Standing Status:   Future    Standing Expiration Date:   06/26/2023   Pulmonary function test    07/2023    Standing Status:   Future    Standing Expiration Date:   06/26/2023    Order Specific Question:   Where should this test be performed?    Answer:   [facility]    Order Specific Question:   Full PFT: includes Eric following: basic spirometry, spirometry pre & post bronchodilator, diffusion capacity (DLCO), lung volumes    Answer:   Full PFT   No orders of Eric defined types were placed in this encounter.   Return in about 1 year (around 06/26/2023) for after PFT.  I have spent a total time of 30-minutes on Eric day of Eric appointment including chart review, data review, collecting history, coordinating care and discussing medical diagnosis and plan with Eric Eric [name]/family. Past medical history, allergies, medications were reviewed. Pertinent imaging, labs and tests included in this note have been reviewed and interpreted independently by me.  Kealey Kemmer Mechele Collin, MD [facility] 06/26/2022 2:43 PM

## 2022-07-06 ENCOUNTER — Telehealth: Payer: Self-pay

## 2022-07-06 NOTE — Telephone Encounter (Signed)
Received ENT SOAP Note from 07/01/22 from Dr. Suszanne Conners, MD.  Plan:  The nasal endoscopy findings were reviewed with the patient. The patient was reassured that no infection or polyps were noted. The patient will continue with Flonase spray - 2 sprays each nostril daily. The patient will return for reevaluation in 6 months.  Message forwarded another provider as update due to Dr. Selena Batten being out of the office at this time.

## 2022-07-06 NOTE — Telephone Encounter (Signed)
Noted. Thanks.

## 2022-07-09 ENCOUNTER — Encounter (HOSPITAL_BASED_OUTPATIENT_CLINIC_OR_DEPARTMENT_OTHER): Payer: Self-pay | Admitting: Pulmonary Disease

## 2022-07-20 ENCOUNTER — Telehealth (HOSPITAL_BASED_OUTPATIENT_CLINIC_OR_DEPARTMENT_OTHER): Payer: Self-pay | Admitting: Pulmonary Disease

## 2022-07-20 DIAGNOSIS — G4734 Idiopathic sleep related nonobstructive alveolar hypoventilation: Secondary | ICD-10-CM

## 2022-07-20 NOTE — Telephone Encounter (Signed)
Please request ONO report from 07/16/22

## 2022-07-24 NOTE — Addendum Note (Signed)
Addended by: Arlyss Repress on: 07/24/2022 04:38 PM   Modules accepted: Orders

## 2022-07-24 NOTE — Telephone Encounter (Signed)
[facility] Telephone Encounter  ONO 07/16/22 on CPAP on room air  SpO2 <88% Nadir SpO2 63%  Baseline line SpO2 94% 1 artifact event  Discussed with patient that his need for overnight O2 is borderline. He does qualify since SpO2 <88% for >5 minutes however baseline is appropriate and I suspect artifact episodes is related to awakening due to nocturia and awakening for the morning.  After discussion, shared decision made to discontinue O2 and continue CPAP nightly on room air.  Oxygen will be cancelled.

## 2022-07-24 NOTE — Telephone Encounter (Signed)
ONO has been given to Dr. Everardo All.   Nothing further needed.

## 2022-07-29 ENCOUNTER — Ambulatory Visit: Payer: Medicare Other | Admitting: Allergy

## 2022-08-04 NOTE — Progress Notes (Unsigned)
Follow Up Note  RE: Eric [name] MRN: [id number] DOB: Dec 31, 1955 Date of Office Visit: 08/05/2022  Referring provider: Natalia Leatherwood, DO Primary care provider: Natalia Leatherwood, DO  Chief Complaint: No chief complaint on file.  History of Present Illness: I had the pleasure of seeing Eric [name] for a follow up visit at [facility] on 08/04/2022. He is a 67 y.o. male, who is being followed for asthma, GERD, rhinitis, eosinophilia. His previous allergy office visit was on 04/20/2022 with Dr. Selena Batten. Today is a regular follow up visit.  Received ENT SOAP Note from 07/01/22 from Dr. Suszanne Conners, MD.   Plan:   The nasal endoscopy findings were reviewed with the patient. The patient was reassured that no infection or polyps were noted. The patient will continue with Flonase spray - 2 sprays each nostril daily. The patient will return for reevaluation in 6 months.      Not well controlled moderate persistent asthma Past history - Noted shortness of breath with swimming and first mile running. Sometimes has coughing and wheezing at the end. Runs 6 miles consistently. Denies any prior asthma diagnosis or inhaler use. 2023 spirometry was normal. Interim history - stopped Symbicort and about 6 weeks ago 1 day after his Covid-19 booster noted coughing and wheezing at times. Airsupra not covered.  Today's spirometry was normal but worse than previous one. I question if stopping the Symbicort is what causing his coughing and wheezing and the vaccine was just coincidental. Hold off any scuba diving as his respiratory status is not fully controlled. Daily controller medication(s): start Symbicort 1 puff twice a day with spacer and rinse mouth afterwards. (Patient requesting the dose as it's cheaper than 2 puffs BID).  During respiratory infections/flares:  May use levoalbuterol rescue inhaler 2 puffs every 4 to 6 hours as needed for shortness of breath, chest  tightness, coughing, and wheezing. May use albuterol rescue inhaler 2 puffs 5 to 15 minutes prior to strenuous physical activities. Monitor frequency of use.  Keep follow up with pulmonology.   Peripheral eosinophilia Past history - 12/08/2021 cbc diff showed eos of 3300. No recent eos to compare to. Last eos in 2017 was 230. Patient had about 3lbs weight loss the last 1-2 months but also not eating as much and exercising less as he has not been feeling well with his bouts of bronchitis and Covid-19. Also taking more NSAIDs. Started aspirin in spring 2023. Heme eval done - no further f/u needed. Interim history - 03/23/22 eos 700.  No need to repeat today.   GERD (gastroesophageal reflux disease) Controlled with PPI. Continue lifestyle and dietary modifications. Continue omeprazole 40mg  daily.    Chronic rhinitis Past history - Some mild symptoms in the spring and fall. Tried zyrtec with no benefit. Skin testing in 2006 was positive to mold per patient. No prior AIT. 2023 skin prick testing was negative to indoor/outdoor allergens. 2023 environmental panel borderline positive to dog and cat.  Interim history - some nasal congestion. Continue environmental control measures.  Use Flonase (fluticasone) or Nasacort nasal spray 1 spray per nostril twice a day as needed for nasal congestion.  Recommend ENT evaluation next to look at sinus anatomy.   Return in about 3 months (around 07/21/2022).    Assessment and Plan: Eric [name] is a 67 y.o. male with: No problem-specific Assessment & Plan notes found for this encounter.  No follow-ups on file.  No orders of the  defined types were placed in this encounter.  Lab Orders  No laboratory test(s) ordered today    Diagnostics: Spirometry:  Tracings reviewed. His effort: {Blank single:19197::"Good reproducible efforts.","It was hard to get consistent efforts and there is a question as to whether this reflects a maximal maneuver.","Poor effort, data  can not be interpreted."} FVC: ***L FEV1: ***L, ***% predicted FEV1/FVC ratio: ***% Interpretation: {Blank single:19197::"Spirometry consistent with mild obstructive disease","Spirometry consistent with moderate obstructive disease","Spirometry consistent with severe obstructive disease","Spirometry consistent with possible restrictive disease","Spirometry consistent with mixed obstructive and restrictive disease","Spirometry uninterpretable due to technique","Spirometry consistent with normal pattern","No overt abnormalities noted given today's efforts"}.  Please see scanned spirometry results for details.  Skin Testing: {Blank single:19197::"Select foods","Environmental allergy panel","Environmental allergy panel and select foods","Food allergy panel","None","Deferred due to recent antihistamines use"}. *** Results discussed with patient/family.   Medication List:  Current Outpatient Medications  Medication Sig Dispense Refill   albuterol (PROVENTIL) (2.5 MG/3ML) 0.083% nebulizer solution Take 3 mLs (2.5 mg total) by nebulization every 4 (four) hours as needed for wheezing or shortness of breath (coughing fits). 75 mL 1   albuterol (VENTOLIN HFA) 108 (90 Base) MCG/ACT inhaler Inhale 2 puffs into the lungs every 4 (four) hours as needed for wheezing or shortness of breath (coughing fits). 18 g 1   ASPIRIN 81 PO Take 1 tablet by mouth daily as needed.     atorvastatin (LIPITOR) 80 MG tablet Take 1 tablet (80 mg total) by mouth at bedtime. 90 tablet 3   b complex vitamins capsule Take 1 capsule by mouth daily.     budesonide-formoterol (SYMBICORT) 160-4.5 MCG/ACT inhaler Inhale 1 puff into the lungs in the morning and at bedtime. with spacer and rinse mouth afterwards. 1 each 3   COD LIVER OIL PO Take by mouth.     Omeprazole 20 MG TBEC Take 40 mg by mouth.     OXYGEN Inhale 2 L into the lungs at bedtime. (Patient not taking: Reported on 04/20/2022)     Probiotic Product (PROBIOTIC ADVANCED PO)  Take by mouth.     Spacer/Aero-Holding Chambers (AEROCHAMBER MV) inhaler Use as instructed (Patient not taking: Reported on 04/20/2022) 1 each 2   TH MAGNESIUM PO Take by mouth.     No current facility-administered medications for this visit.   Allergies: Allergies  Allergen Reactions   Gluten Meal Diarrhea and Other (See Comments)    GI   I reviewed his past medical history, social history, family history, and environmental history and no significant changes have been reported from his previous visit.  Review of Systems  Constitutional:  Negative for appetite change, chills, fever and unexpected weight change.  HENT:  Negative for congestion and rhinorrhea.        Throat clearing  Eyes:  Negative for itching.  Respiratory:  Positive for cough. Negative for chest tightness, shortness of breath and wheezing.   Gastrointestinal:  Negative for abdominal pain.  Skin:  Negative for rash.  Neurological:  Negative for headaches.    Objective: There were no vitals taken for this visit. There is no height or weight on file to calculate BMI. Physical Exam Vitals and nursing note reviewed.  Constitutional:      Appearance: Normal appearance. He is well-developed.  HENT:     Head: Normocephalic and atraumatic.     Right Ear: Tympanic membrane and external ear normal.     Left Ear: Tympanic membrane and external ear normal.     Nose: Nose normal.  Mouth/Throat:     Mouth: Mucous membranes are moist.     Pharynx: Oropharynx is clear.  Eyes:     Conjunctiva/sclera: Conjunctivae normal.  Cardiovascular:     Rate and Rhythm: Normal rate and regular rhythm.     Heart sounds: Normal heart sounds. No murmur heard. Pulmonary:     Effort: Pulmonary effort is normal.     Breath sounds: Normal breath sounds. No wheezing, rhonchi or rales.  Musculoskeletal:     Cervical back: Neck supple.  Skin:    General: Skin is warm.     Findings: No rash.  Neurological:     Mental Status: He is  alert and oriented to person, place, and time.  Psychiatric:        Behavior: Behavior normal.    Previous notes and tests were reviewed. The plan was reviewed with the patient/family, and all questions/concerned were addressed.  It was my pleasure to see Eric [name] today and participate in his care. Please feel free to contact me with any questions or concerns.  Sincerely,  Wyline Mood, DO Allergy & Immunology  [facility] office: [phone] [facility] office: [phone]

## 2022-08-05 ENCOUNTER — Ambulatory Visit (INDEPENDENT_AMBULATORY_CARE_PROVIDER_SITE_OTHER): Payer: Medicare Other | Admitting: Allergy

## 2022-08-05 ENCOUNTER — Encounter: Payer: Self-pay | Admitting: Allergy

## 2022-08-05 VITALS — BP 128/86 | HR 65 | Temp 98.6°F | Resp 20 | Ht 65.0 in | Wt 151.0 lb

## 2022-08-05 DIAGNOSIS — K219 Gastro-esophageal reflux disease without esophagitis: Secondary | ICD-10-CM | POA: Diagnosis not present

## 2022-08-05 DIAGNOSIS — D7219 Other eosinophilia: Secondary | ICD-10-CM | POA: Diagnosis not present

## 2022-08-05 DIAGNOSIS — J310 Chronic rhinitis: Secondary | ICD-10-CM | POA: Diagnosis not present

## 2022-08-05 DIAGNOSIS — J4540 Moderate persistent asthma, uncomplicated: Secondary | ICD-10-CM | POA: Diagnosis not present

## 2022-08-05 NOTE — Assessment & Plan Note (Signed)
Symptomatic if misses PPI dose.  Continue lifestyle and dietary modifications. Continue omeprazole 40mg  once day - nothing to eat or drink for 20-30 minutes afterwards.  Follow up with GI.

## 2022-08-05 NOTE — Assessment & Plan Note (Signed)
Past history - Noted shortness of breath with swimming and first mile running. Sometimes has coughing and wheezing at the end. Runs 6 miles consistently. Denies any prior asthma diagnosis or inhaler use. 2023 spirometry was normal. Interim history - currently on Symbicort 1 puff BID and doing well. Sometimes premedicates prior to exercise but able to run 10 miles and bike 12 miles. Still planning on scuba diving. Had prednisone 1 month ago by ENT for nasal congestion. Followed by pulm as well.  Today's spirometry was normal.  Daily controller medication(s): Decrease Symbicort 1 puff to ONCE a day x 2 weeks and then stop. If you notice issues then okay to continue Symbicort as before.  During respiratory infections/flares:  May use levoalbuterol rescue inhaler 2 puffs every 4 to 6 hours as needed for shortness of breath, chest tightness, coughing, and wheezing. May use albuterol rescue inhaler 2 puffs 5 to 15 minutes prior to strenuous physical activities. Monitor frequency of use.  Keep follow up with pulmonology. I recommended that he premedicated with levoalbuterol prior to scuba diving and to hold off scuba diving if his breathing is not controlled.  Get spirometry at next visit.

## 2022-08-05 NOTE — Assessment & Plan Note (Signed)
Past history - 12/08/2021 cbc diff showed eos of 3300. No recent eos to compare to. Last eos in 2017 was 230. Patient had about 3lbs weight loss the last 1-2 months but also not eating as much and exercising less as he has not been feeling well with his bouts of bronchitis and Covid-19. Also taking more NSAIDs. Started aspirin in spring 2023. Heme eval done - no further f/u needed. 03/23/22 eos 700. No need to repeat today.

## 2022-08-05 NOTE — Patient Instructions (Addendum)
Breathing Daily controller medication(s): Decrease Symbicort 1 puff to ONCE a day x 2 weeks and then stop. If you notice issues then okay to continue Symbicort as before.  During respiratory infections/flares:  May use levoalbuterol rescue inhaler 2 puffs every 4 to 6 hours as needed for shortness of breath, chest tightness, coughing, and wheezing. May use albuterol rescue inhaler 2 puffs 5 to 15 minutes prior to strenuous physical activities. Monitor frequency of use.  Breathing control goals:  Full participation in all desired activities (may need albuterol before activity) Albuterol use two times or less a week on average (not counting use with activity) Cough interfering with sleep two times or less a month Oral steroids no more than once a year No hospitalizations   Eosinophils No indication to recheck today.   Reflux Continue lifestyle and dietary modifications. Continue omeprazole 40mg  once day - nothing to eat or drink for 20-30 minutes afterwards.  Follow up with GI.  Rhinitis 2023 skin testing was negative to indoor/outdoor allergens.  2023 bloodwork was borderline to cat and dog. Continue environmental control measures.  Use Flonase (fluticasone) or Nasacort nasal spray 1 spray per nostril twice a day as needed for nasal congestion.   Follow up in 3 months or sooner if needed.  Pet Allergen Avoidance: Contrary to popular opinion, there are no "hypoallergenic" breeds of dogs or cats. That is because people are not allergic to an animal's hair, but to an allergen found in the animal's saliva, dander (dead skin flakes) or urine. Pet allergy symptoms typically occur within minutes. For some people, symptoms can build up and become most severe 8 to 12 hours after contact with the animal. People with severe allergies can experience reactions in public places if dander has been transported on the pet owners' clothing. Keeping an animal outdoors is only a partial solution,  since homes with pets in the yard still have higher concentrations of animal allergens. Before getting a pet, ask your allergist to determine if you are allergic to animals. If your pet is already considered part of your family, try to minimize contact and keep the pet out of the bedroom and other rooms where you spend a great deal of time. As with dust mites, vacuum carpets often or replace carpet with a hardwood floor, tile or linoleum. High-efficiency particulate air (HEPA) cleaners can reduce allergen levels over time. While dander and saliva are the source of cat and dog allergens, urine is the source of allergens from rabbits, hamsters, mice and [place] pigs; so ask a non-allergic family member to clean the animal's cage. If you have a pet allergy, talk to your allergist about the potential for allergy immunotherapy (allergy shots). This strategy can often provide long-term relief.

## 2022-08-05 NOTE — Assessment & Plan Note (Signed)
Past history - Some mild symptoms in the spring and fall. Tried zyrtec with no benefit. Skin testing in 2006 was positive to mold per patient. No prior AIT. 2023 skin prick testing was negative to indoor/outdoor allergens. 2023 environmental panel borderline positive to dog and cat.  Interim history - some nasal congestion still. Saw ENT and given prednisone with some benefit.  Continue environmental control measures.  Use Flonase (fluticasone) or Nasacort nasal spray 1 spray per nostril twice a day as needed for nasal congestion.

## 2022-08-11 ENCOUNTER — Encounter (HOSPITAL_BASED_OUTPATIENT_CLINIC_OR_DEPARTMENT_OTHER): Payer: Self-pay | Admitting: Pulmonary Disease

## 2022-08-12 NOTE — Telephone Encounter (Signed)
Received message from [place] with [employer] "Message was sent to get that date updated and picked up."

## 2022-08-12 NOTE — Telephone Encounter (Signed)
I sent urgent message to [employer] stating order was placed on 07/24/22 and confirmed by Elease Hashimoto on 07/27/22.

## 2022-09-02 ENCOUNTER — Encounter (INDEPENDENT_AMBULATORY_CARE_PROVIDER_SITE_OTHER): Payer: Self-pay

## 2022-09-02 ENCOUNTER — Ambulatory Visit: Payer: Medicare Other | Admitting: Allergy

## 2022-09-03 ENCOUNTER — Other Ambulatory Visit: Payer: Self-pay | Admitting: Oncology

## 2022-09-03 DIAGNOSIS — Z006 Encounter for examination for normal comparison and control in clinical research program: Secondary | ICD-10-CM

## 2022-09-09 ENCOUNTER — Ambulatory Visit (INDEPENDENT_AMBULATORY_CARE_PROVIDER_SITE_OTHER): Payer: Medicare Other

## 2022-09-09 VITALS — Wt 150.0 lb

## 2022-09-09 DIAGNOSIS — Z0000 Encounter for general adult medical examination without abnormal findings: Secondary | ICD-10-CM

## 2022-09-09 NOTE — Progress Notes (Signed)
Subjective:   Eric [name] is a 67 y.o. male who presents for Medicare Annual/Subsequent preventive examination.  Visit Complete: Virtual  I connected with  Sione Meinholz Doucet on 09/09/22 by a audio enabled telemedicine application and verified that I am speaking with the correct person using two identifiers.  Patient Location: Home  Provider Location: Home Office  I discussed the limitations of evaluation and management by telemedicine. The patient expressed understanding and agreed to proceed.  Patient Medicare AWV questionnaire was completed by the patient on 09/05/22; I have confirmed that all information answered by patient is correct and no changes since this date.    Vital Signs: Unable to obtain new vitals due to this being a telehealth visit.   Review of Systems     Cardiac Risk Factors include: advanced age (>84men, >34 women);male gender;dyslipidemia     Objective:    Today's Vitals   09/09/22 1538  Weight: 150 lb (68 kg)   Body mass index is 24.96 kg/m.     09/09/2022    3:42 PM 01/13/2022   11:21 AM 09/09/2021    8:15 AM 11/27/2015    8:39 AM  Advanced Directives  Does Patient Have a Medical Advance Directive? Yes No Yes No  Type of Estate agent of [place];Living will  Healthcare Power of [place];Living will   Does patient want to make changes to medical advance directive?   No - Patient declined   Copy of Healthcare Power of Attorney in Chart? No - copy requested  No - copy requested   Would patient like information on creating a medical advance directive?  No - Patient declined      Current Medications (verified) Outpatient Encounter Medications as of 09/09/2022  Medication Sig   albuterol (PROVENTIL) (2.5 MG/3ML) 0.083% nebulizer solution Take 3 mLs (2.5 mg total) by nebulization every 4 (four) hours as needed for wheezing or shortness of breath (coughing fits).   albuterol (VENTOLIN HFA) 108 (90 Base) MCG/ACT inhaler Inhale 2 puffs  into the lungs every 4 (four) hours as needed for wheezing or shortness of breath (coughing fits).   ASPIRIN 81 PO Take 1 tablet by mouth daily as needed.   atorvastatin (LIPITOR) 80 MG tablet Take 1 tablet (80 mg total) by mouth at bedtime.   b complex vitamins capsule Take 1 capsule by mouth daily.   Omeprazole 20 MG TBEC Take 40 mg by mouth.   Probiotic Product (PROBIOTIC ADVANCED PO) Take by mouth.   Spacer/Aero-Holding Chambers (AEROCHAMBER MV) inhaler Use as instructed   TH MAGNESIUM PO Take by mouth.   [DISCONTINUED] budesonide-formoterol (SYMBICORT) 160-4.5 MCG/ACT inhaler Inhale 1 puff into the lungs in the morning and at bedtime. with spacer and rinse mouth afterwards.   [DISCONTINUED] COD LIVER OIL PO Take by mouth.   [DISCONTINUED] OXYGEN Inhale 2 L into the lungs at bedtime.   No facility-administered encounter medications on file as of 09/09/2022.    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   Coronary CT Angiograpm  July-August 2018   1) Calcium score 147 which is 74th percentile for age and sex 2) Right dominant coronary arteries with Less than 30% calcified plaque in proximal LAD. Less than 50% calcified plaque in proximal RCA, Less than  30% soft plauqe in mid RCA and Less than 30% calcified plaque in distal RCA   MOUTH SURGERY  1965   TOE SURGERY  2010   TONSILLECTOMY  1962   VASECTOMY     Family History  Problem Relation Age of Onset   Hypertension Mother    Breast cancer Mother    Hearing loss Mother    Hyperlipidemia Father    Hypertension Father    Dementia Father    Heart disease Father    Prostate cancer Father    Heart attack Father        Paternal uncle also had MI   Depression Father    Obesity Father    Alcohol abuse Sister    Allergic rhinitis Brother    Hypertension Brother    Alcohol abuse Brother    Coronary artery disease Paternal Uncle    Heart attack Paternal Uncle    Heart attack Paternal Uncle    Coronary artery disease Paternal Uncle    Heart disease Paternal Grandfather    Angioedema Other    Eczema Other    Colon cancer Neg Hx    Colon polyps Neg Hx    Pancreatic cancer Neg Hx    Rectal cancer Neg Hx    Stomach cancer Neg Hx    Social History   Socioeconomic History   Marital status: Married    Spouse name: Not on file   Number of children: 4   Years of education: Not on file   Highest education level: Master's degree (e.g., MA, MS, MEng, MEd, MSW, MBA)  Occupational History   Occupation: Production designer, theatre/television/film    Comment: Health and safety inspector  Tobacco Use   Smoking status: Former    Current packs/day: 0.00    Average packs/day: 1 pack/day for 2.0 years (2.0 ttl pk-yrs)    Types: Cigarettes    Start date: 07/03/1969    Quit date: 07/04/1971    Years since quitting: 51.2    Passive exposure: Never   Smokeless tobacco: Never  Vaping Use   Vaping status: Never Used  Substance and Sexual Activity   Alcohol use: Yes    Alcohol/week: 7.0 standard drinks of alcohol    Types: 7 Glasses of wine per week   Drug use: No   Sexual activity: Not Currently    Partners: Female    Birth control/protection: Surgical    Comment: vasectomy  Other Topics Concern   Not on file  Social History Narrative    He is been married for 37 years.    Masters degree executive VP for [employer]   He is an avid exercise enthusiast - runs, bicycling and swimming at least 5 days a week for 40 minutes in time.   Has smoke alarm in the home   Wears a seatbelt.   Feels safe in his relationship.   Social Determinants  of Health   Financial Resource Strain: Low Risk  (09/05/2022)   Overall Financial Resource Strain (CARDIA)    Difficulty of Paying Living Expenses: Not hard at all  Food Insecurity: No Food Insecurity (09/05/2022)   Hunger Vital Sign    Worried About Running Out of Food in the Last Year: Never true    Ran Out of Food in the Last Year: Never true  Transportation Needs: No Transportation Needs (09/05/2022)   PRAPARE - Administrator, Civil Service (Medical): No    Lack of Transportation (Non-Medical): No  Physical Activity: Sufficiently Active (09/05/2022)   Exercise Vital Sign    Days of Exercise per Week: 6 days    Minutes of Exercise per Session: 60 min  Stress: No Stress Concern Present (09/05/2022)   [employer] of Occupational Health - Occupational Stress Questionnaire    Feeling of Stress : Not at all  Social Connections: Moderately Integrated (09/05/2022)   Social Connection and Isolation Panel [NHANES]    Frequency of Communication with Friends and Family: Three times a week    Frequency of Social Gatherings with Friends and Family: Once a week    Attends Religious Services: Never    Database administrator or Organizations: Yes    Attends Engineer, structural: More than 4 times per year    Marital Status: Married    Tobacco Counseling Counseling given: Not Answered   Clinical Intake:  Pre-visit preparation completed: Yes  Pain : No/denies pain     BMI - recorded: 24.96 Nutritional Status: BMI of 19-24  Normal Nutritional Risks: None Diabetes: No  How often do you need to have someone help you when you read instructions, pamphlets, or other  written materials from your doctor or pharmacy?: 1 - Never  Interpreter Needed?: No  Information entered by :: Lanier Ensign, LPN   Activities of Daily Living    09/05/2022   12:46 PM  In your present state of health, do you have any difficulty performing the following activities:  Hearing? 0  Vision? 0  Difficulty concentrating or making decisions? 0  Walking or climbing stairs? 0  Dressing or bathing? 0  Doing errands, shopping? 0  Preparing Food and eating ? N  Using the Toilet? N  In the past six months, have you accidently leaked urine? Y  Comment at times  Do you have problems with loss of bowel control? N  Managing your Medications? N  Managing your Finances? N  Housekeeping or managing your Housekeeping? N    Patient Care Team: Natalia Leatherwood, DO as PCP - General (Family Medicine) [facility] as Consulting Physician (Optometry)  Indicate any recent Medical Services you may have received from other than [facility] providers in the past year (date may be approximate).     Assessment:   This is a routine wellness examination for Amario.  Hearing/Vision screen Hearing Screening - Comments:: Pt denies any hearing issues  Vision Screening - Comments:: Pt follows up with [facility] for annual eye exams   Dietary issues and exercise activities discussed:     Goals Addressed             This Visit's Progress    Patient Stated       Stay active        Depression Screen    09/09/2022    3:42 PM 02/05/2022   11:59 AM 09/09/2021    8:13 AM 06/06/2021    1:46 PM  PHQ 2/9 Scores  PHQ - 2 Score 0 0 0 0    Fall Risk    09/05/2022   12:46 PM 02/05/2022   11:58 AM 09/09/2021    8:14 AM 09/09/2021    7:59 AM 09/02/2021    9:21 AM  Fall Risk   Falls in the past year? 0 0 0 0 0  Number falls in past yr: 0 0 0  0  Injury with Fall? 0 0 0  0  Risk for fall due to : Impaired vision  No Fall Risks    Follow up Falls prevention discussed Falls evaluation completed Falls evaluation  completed      MEDICARE RISK AT HOME:   TIMED UP AND GO:  Was the test performed?  No    Cognitive Function:        09/09/2022    3:44 PM 09/09/2021    8:13 AM  6CIT Screen  What Year? 0 points 0 points  What month? 0 points 0 points  What time? 0 points 0 points  Count back from 20 0 points 0 points  Months in reverse 0 points 0 points  Repeat phrase 0 points 0 points  Total Score 0 points 0 points    Immunizations Immunization History  Administered Date(s) Administered   Influenza Split 12/23/2011, 11/28/2018, 11/21/2019   Influenza,inj,Quad PF,6+ Mos 01/17/2014, 12/11/2014, 12/11/2015, 12/11/2016, 11/14/2020   Moderna Covid-19 Vaccine Bivalent Booster 55yrs & up 10/21/2020   Moderna Sars-Covid-2 Vaccination 05/23/2020   PFIZER(Purple Top)SARS-COV-2 Vaccination 04/09/2019, 05/02/2019, 11/21/2019, 05/23/2020   PNEUMOCOCCAL CONJUGATE-20 09/09/2021   Pfizer Covid-19 Vaccine Bivalent Booster 17yrs & up 10/21/2020   Td 05/15/2008    TDAP status: Due, Education has been provided regarding the importance of this vaccine. Advised may receive this vaccine at local pharmacy or Health Dept. Aware to provide a copy of the vaccination record if obtained from local pharmacy or Health Dept. Verbalized acceptance and understanding.  Flu Vaccine status: Due, Education has been provided regarding the importance of this vaccine. Advised may receive this vaccine at local pharmacy or Health Dept. Aware to provide a copy of the vaccination record if obtained from local pharmacy or Health Dept. Verbalized acceptance and understanding.  Pneumococcal vaccine status: Up to date  Covid-19 vaccine status: Information provided on how to obtain vaccines.   Qualifies for Shingles Vaccine? Yes   Zostavax completed Yes   Shingrix Completed?: Yes  Screening Tests Health Maintenance  Topic Date Due   Zoster Vaccines- Shingrix (1 of 2) Never done   DTaP/Tdap/Td (2 - Tdap) 05/16/2018   COVID-19  Vaccine (7 - 2023-24 season) 10/03/2021   INFLUENZA VACCINE  09/03/2022   Medicare Annual Wellness (AWV)  09/09/2023   Colonoscopy  12/29/2025   Pneumonia Vaccine 7+ Years old  Completed   Hepatitis C Screening  Completed   HPV VACCINES  Aged Out    Health Maintenance  Health Maintenance Due  Topic Date Due   Zoster Vaccines- Shingrix (1 of 2) Never done   DTaP/Tdap/Td (2 - Tdap) 05/16/2018   COVID-19 Vaccine (7 - 2023-24 season) 10/03/2021   INFLUENZA VACCINE  09/03/2022    Colorectal cancer screening: Type of screening: Colonoscopy. Completed 12/30/15. Repeat every 10 years   Additional Screening:  Hepatitis C Screening:  Completed 09/09/21  Vision Screening: Recommended annual ophthalmology exams for early detection of glaucoma and other disorders of the eye. Is the patient up to date with their annual eye exam?  Yes  Who is the  provider or what is the name of the office in which the patient attends annual eye exams? [facility]  If pt is not established with a provider, would they like to be referred to a provider to establish care? No .   Dental Screening: Recommended annual dental exams for proper oral hygiene    Community Resource Referral / Chronic Care Management: CRR required this visit?  No   CCM required this visit?  No     Plan:     I have personally reviewed and noted the following in the patient's chart:   Medical and social history Use of alcohol, tobacco or illicit drugs  Current medications and supplements including opioid prescriptions. Patient is not currently taking opioid prescriptions. Functional ability and status Nutritional status Physical activity Advanced directives List of other physicians Hospitalizations, surgeries, and ER visits in previous 12 months Vitals Screenings to include cognitive, depression, and falls Referrals and appointments  In addition, I have reviewed and discussed with patient certain preventive protocols, quality  metrics, and best practice recommendations. A written personalized care plan for preventive services as well as general preventive health recommendations were provided to patient.     Marzella Schlein, LPN   02/07/1094   After Visit Summary: (MyChart) Due to this being a telephonic visit, the after visit summary with patients personalized plan was offered to patient via MyChart   Nurse Notes: none

## 2022-09-09 NOTE — Patient Instructions (Signed)
Eric [name] , Thank you for taking time to come for your Medicare Wellness Visit. I appreciate your ongoing commitment to your health goals. Please review the following plan we discussed and let me know if I can assist you in the future.   Referrals/Orders/Follow-Ups/Clinician Recommendations: stay active   This is a list of the screening recommended for you and due dates:  Health Maintenance  Topic Date Due   Zoster (Shingles) Vaccine (1 of 2) Never done   DTaP/Tdap/Td vaccine (2 - Tdap) 05/16/2018   COVID-19 Vaccine (7 - 2023-24 season) 10/03/2021   Flu Shot  09/03/2022   Medicare Annual Wellness Visit  09/09/2023   Colon Cancer Screening  12/29/2025   Pneumonia Vaccine  Completed   Hepatitis C Screening  Completed   HPV Vaccine  Aged Out    Advanced directives: (Copy Requested) Please bring a copy of your health care power of attorney and living will to the office to be added to your chart at your convenience.  Next Medicare Annual Wellness Visit scheduled for next year: Yes  Preventive Care 53 Years and Older, Male  Preventive care refers to lifestyle choices and visits with your health care provider that can promote health and wellness. What does preventive care include? A yearly physical exam. This is also called an annual well check. Dental exams once or twice a year. Routine eye exams. Ask your health care provider how often you should have your eyes checked. Personal lifestyle choices, including: Daily care of your teeth and gums. Regular physical activity. Eating a healthy diet. Avoiding tobacco and drug use. Limiting alcohol use. Practicing safe sex. Taking low doses of aspirin every day. Taking vitamin and mineral supplements as recommended by your health care provider. What happens during an annual well check? The services and screenings done by your health care provider during your annual well check will depend on your age, overall health, lifestyle risk factors,  and family history of disease. Counseling  Your health care provider may ask you questions about your: Alcohol use. Tobacco use. Drug use. Emotional well-being. Home and relationship well-being. Sexual activity. Eating habits. History of falls. Memory and ability to understand (cognition). Work and work Astronomer. Screening  You may have the following tests or measurements: Height, weight, and BMI. Blood pressure. Lipid and cholesterol levels. These may be checked every 5 years, or more frequently if you are over 66 years old. Skin check. Lung cancer screening. You may have this screening every year starting at age 83 if you have a 30-pack-year history of smoking and currently smoke or have quit within the past 15 years. Fecal occult blood test (FOBT) of the stool. You may have this test every year starting at age 92. Flexible sigmoidoscopy or colonoscopy. You may have a sigmoidoscopy every 5 years or a colonoscopy every 10 years starting at age 23. Prostate cancer screening. Recommendations will vary depending on your family history and other risks. Hepatitis C blood test. Hepatitis B blood test. Sexually transmitted disease (STD) testing. Diabetes screening. This is done by checking your blood sugar (glucose) after you have not eaten for a while (fasting). You may have this done every 1-3 years. Abdominal aortic aneurysm (AAA) screening. You may need this if you are a current or former smoker. Osteoporosis. You may be screened starting at age 36 if you are at high risk. Talk with your health care provider about your test results, treatment options, and if necessary, the need for more tests. Vaccines  Your health care provider may recommend certain vaccines, such as: Influenza vaccine. This is recommended every year. Tetanus, diphtheria, and acellular pertussis (Tdap, Td) vaccine. You may need a Td booster every 10 years. Zoster vaccine. You may need this after age  7. Pneumococcal 13-valent conjugate (PCV13) vaccine. One dose is recommended after age 40. Pneumococcal polysaccharide (PPSV23) vaccine. One dose is recommended after age 62. Talk to your health care provider about which screenings and vaccines you need and how often you need them. This information is not intended to replace advice given to you by your health care provider. Make sure you discuss any questions you have with your health care provider. Document Released: 02/15/2015 Document Revised: 10/09/2015 Document Reviewed: 11/20/2014 Elsevier Interactive Patient Education  2017 [employer].  Fall Prevention in the Home Falls can cause injuries. They can happen to people of all ages. There are many things you can do to make your home safe and to help prevent falls. What can I do on the outside of my home? Regularly fix the edges of walkways and driveways and fix any cracks. Remove anything that might make you trip as you walk through a door, such as a raised step or threshold. Trim any bushes or trees on the path to your home. Use bright outdoor lighting. Clear any walking paths of anything that might make someone trip, such as rocks or tools. Regularly check to see if handrails are loose or broken. Make sure that both sides of any steps have handrails. Any raised decks and porches should have guardrails on the edges. Have any leaves, snow, or ice cleared regularly. Use sand or salt on walking paths during winter. Clean up any spills in your garage right away. This includes oil or grease spills. What can I do in the bathroom? Use night lights. Install grab bars by the toilet and in the tub and shower. Do not use towel bars as grab bars. Use non-skid mats or decals in the tub or shower. If you need to sit down in the shower, use a plastic, non-slip stool. Keep the floor dry. Clean up any water that spills on the floor as soon as it happens. Remove soap buildup in the tub or shower  regularly. Attach bath mats securely with double-sided non-slip rug tape. Do not have throw rugs and other things on the floor that can make you trip. What can I do in the bedroom? Use night lights. Make sure that you have a light by your bed that is easy to reach. Do not use any sheets or blankets that are too big for your bed. They should not hang down onto the floor. Have a firm chair that has side arms. You can use this for support while you get dressed. Do not have throw rugs and other things on the floor that can make you trip. What can I do in the kitchen? Clean up any spills right away. Avoid walking on wet floors. Keep items that you use a lot in easy-to-reach places. If you need to reach something above you, use a strong step stool that has a grab bar. Keep electrical cords out of the way. Do not use floor polish or wax that makes floors slippery. If you must use wax, use non-skid floor wax. Do not have throw rugs and other things on the floor that can make you trip. What can I do with my stairs? Do not leave any items on the stairs. Make sure that there are  handrails on both sides of the stairs and use them. Fix handrails that are broken or loose. Make sure that handrails are as long as the stairways. Check any carpeting to make sure that it is firmly attached to the stairs. Fix any carpet that is loose or worn. Avoid having throw rugs at the top or bottom of the stairs. If you do have throw rugs, attach them to the floor with carpet tape. Make sure that you have a light switch at the top of the stairs and the bottom of the stairs. If you do not have them, ask someone to add them for you. What else can I do to help prevent falls? Wear shoes that: Do not have high heels. Have rubber bottoms. Are comfortable and fit you well. Are closed at the toe. Do not wear sandals. If you use a stepladder: Make sure that it is fully opened. Do not climb a closed stepladder. Make sure that  both sides of the stepladder are locked into place. Ask someone to hold it for you, if possible. Clearly mark and make sure that you can see: Any grab bars or handrails. First and last steps. Where the edge of each step is. Use tools that help you move around (mobility aids) if they are needed. These include: Canes. Walkers. Scooters. Crutches. Turn on the lights when you go into a dark area. Replace any light bulbs as soon as they burn out. Set up your furniture so you have a clear path. Avoid moving your furniture around. If any of your floors are uneven, fix them. If there are any pets around you, be aware of where they are. Review your medicines with your doctor. Some medicines can make you feel dizzy. This can increase your chance of falling. Ask your doctor what other things that you can do to help prevent falls. This information is not intended to replace advice given to you by your health care provider. Make sure you discuss any questions you have with your health care provider. Document Released: 11/15/2008 Document Revised: 06/27/2015 Document Reviewed: 02/23/2014 Elsevier Interactive Patient Education  2017 [employer].

## 2022-09-14 ENCOUNTER — Encounter: Payer: Self-pay | Admitting: Family Medicine

## 2022-09-14 ENCOUNTER — Encounter (HOSPITAL_BASED_OUTPATIENT_CLINIC_OR_DEPARTMENT_OTHER): Payer: Self-pay | Admitting: Pulmonary Disease

## 2022-09-14 ENCOUNTER — Telehealth (INDEPENDENT_AMBULATORY_CARE_PROVIDER_SITE_OTHER): Payer: Medicare Other | Admitting: Family Medicine

## 2022-09-14 VITALS — Wt 149.9 lb

## 2022-09-14 DIAGNOSIS — J4541 Moderate persistent asthma with (acute) exacerbation: Secondary | ICD-10-CM | POA: Diagnosis not present

## 2022-09-14 MED ORDER — ALBUTEROL SULFATE HFA 108 (90 BASE) MCG/ACT IN AERS: 2.0000 | INHALATION_SPRAY | RESPIRATORY_TRACT | 11 refills | Status: DC | PRN

## 2022-09-14 MED ORDER — PREDNISONE 20 MG PO TABS
ORAL_TABLET | ORAL | 0 refills | Status: AC
Start: 2022-09-14 — End: ?

## 2022-09-14 MED ORDER — FLUTICASONE-SALMETEROL 100-50 MCG/ACT IN AEPB: 1.0000 | INHALATION_SPRAY | Freq: Two times a day (BID) | RESPIRATORY_TRACT | 11 refills | Status: DC

## 2022-09-14 NOTE — Progress Notes (Signed)
VIRTUAL VISIT VIA VIDEO  I connected with Eric [name] on 09/14/22 at  2:20 PM EDT by a video enabled telemedicine application and verified that I am speaking with the correct person using two identifiers. Location patient: Home Location provider: [facility], Office Persons participating in the virtual visit: Patient, Dr. Claiborne Billings and Ivonne Andrew, CMA  I discussed the limitations of evaluation and management by telemedicine and the availability of in person appointments. The patient expressed understanding and agreed to proceed.    Eric [name] , [birth year], 67 y.o., male MRN: [id number] Patient Care Team    Relationship Specialty Notifications Start End  Natalia Leatherwood, DO PCP - General Family Medicine  06/06/21   [facility] Consulting Physician Optometry  06/09/21     Chief Complaint  Patient presents with   Wheezing    Started beginning of Aug after immunization, stopped Symbicort about a month before. Seen by Dr. Lytle Butte and Dr. Tomi Likens     Subjective: Eric [name] is a 67 y.o. Pt presents for an OV with complaints of recurrent RAD with wheeze since stopping symbicort about 4 weeks ago. He had been on steroids for his autoimmune d/o as well, but last steroid was  a few months ago for ENT issue.     09/09/2022    3:42 PM 02/05/2022   11:59 AM 09/09/2021    8:13 AM 06/06/2021    1:46 PM  Depression screen PHQ 2/9  Decreased Interest 0 0 0 0  Down, Depressed, Hopeless 0 0 0 0  PHQ - 2 Score 0 0 0 0    Allergies  Allergen Reactions   Other     Other Reaction(s): Eye Irritation    sinus problems   Gluten Meal Diarrhea and Other (See Comments)    GI   Social History   Social History Narrative   He is been married for 37 years.    Masters degree executive VP for [employer]   He is an avid exercise enthusiast - runs, bicycling and swimming at least 5 days a week for 40 minutes in time.   Has smoke alarm in the home   Wears a seatbelt.   Feels  safe in his relationship.   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   Coronary CT Angiograpm  July-August 2018   1) Calcium score 147 which is 74th percentile for age and sex 2) Right dominant coronary arteries with Less than 30% calcified plaque in proximal LAD. Less than 50% calcified plaque in proximal RCA, Less than 30% soft plauqe in mid RCA and Less than 30% calcified plaque in distal RCA  MOUTH SURGERY  1965   TOE SURGERY  2010   TONSILLECTOMY  1962   VASECTOMY     Family History  Problem Relation Age of Onset   Hypertension Mother    Breast cancer Mother    Hearing loss Mother    Hyperlipidemia Father    Hypertension Father    Dementia Father    Heart disease Father    Prostate cancer Father    Heart attack Father        Paternal uncle also had MI   Depression Father    Obesity Father    Alcohol abuse Sister    Allergic rhinitis Brother    Hypertension Brother    Alcohol abuse Brother    Coronary artery disease Paternal Uncle    Heart attack Paternal Uncle    Heart attack Paternal Uncle    Coronary artery disease Paternal Uncle    Heart disease Paternal Grandfather    Angioedema Other    Eczema Other    Colon cancer Neg Hx    Colon polyps Neg Hx    Pancreatic cancer Neg Hx     Rectal cancer Neg Hx    Stomach cancer Neg Hx    Allergies as of 09/14/2022       Reactions   Other    Other Reaction(s): Eye Irritation    sinus problems   Gluten Meal Diarrhea, Other (See Comments)   GI        Medication List        Accurate as of September 14, 2022  2:38 PM. If you have any questions, ask your nurse or doctor.          AeroChamber MV inhaler Use as instructed   albuterol (2.5 MG/3ML) 0.083% nebulizer solution Commonly known as: PROVENTIL Take 3 mLs (2.5 mg total) by nebulization every 4 (four) hours as needed for wheezing or shortness of breath (coughing fits).   albuterol 108 (90 Base) MCG/ACT inhaler Commonly known as: Ventolin HFA Inhale 2 puffs into the lungs every 4 (four) hours as needed for wheezing or shortness of breath (coughing fits).   ASPIRIN 81 PO Take 1 tablet by mouth daily as needed.   atorvastatin 80 MG tablet Commonly known as: LIPITOR Take 1 tablet (80 mg total) by mouth at bedtime.   b complex vitamins capsule Take 1 capsule by mouth daily.   fluticasone-salmeterol 100-50 MCG/ACT Aepb Commonly known as: ADVAIR Inhale 1 puff into the lungs 2 (two) times daily. Started by: Felix Pacini   Omeprazole 20 MG Tbec Take 40 mg by mouth.   predniSONE 20 MG tablet Commonly known as: DELTASONE 60 mg x3d, 40 mg x3d, 20 mg x2d, 10 mg x2d Started by: Felix Pacini   PROBIOTIC ADVANCED PO Take by mouth.   TH MAGNESIUM PO Take by mouth.        All past medical history, surgical history, allergies, family history, immunizations andmedications were updated in the EMR today and reviewed under the history and medication portions of their EMR.     Review of Systems  Constitutional:  Negative for chills, fever and malaise/fatigue.  HENT:  Negative for sinus pain and sore throat.   Respiratory:  Positive for wheezing. Negative for cough and shortness of breath.   Neurological:  Negative for dizziness and headaches.   Negative,  with the exception of above mentioned in HPI   Objective:  Wt 149 lb 14.4 oz (68 kg)   BMI 24.94 kg/m  Body mass  index is 24.94 kg/m. Physical Exam Vitals and nursing note reviewed.  Constitutional:      General: He is not in acute distress.    Appearance: Normal appearance. He is not toxic-appearing.  HENT:     Head: Normocephalic and atraumatic.  Eyes:     General: No scleral icterus.       Right eye: No discharge.        Left eye: No discharge.     Conjunctiva/sclera: Conjunctivae normal.  Pulmonary:     Effort: Pulmonary effort is normal.  Musculoskeletal:     Cervical back: Normal range of motion.  Skin:    Findings: No rash.  Neurological:     Mental Status: He is alert and oriented to person, place, and time. Mental status is at baseline.  Psychiatric:        Mood and Affect: Mood normal.        Behavior: Behavior normal.        Thought Content: Thought content normal.        Judgment: Judgment normal.     No results found. No results found. No results found for this or any previous visit (from the past 24 hour(s)).  Assessment/Plan: Eric [name] is a 67 y.o. male present for OV for  Moderate persistent asthma with acute exacerbation  Rest, hydrate.  Restart daily controller inhaler.  Advair 100 1 puff BID (looked to be formulary over symbicort)  prescribed Prednisone taper for acute exacerbation.  Albuterol refilled for him F/u PRN    Reviewed expectations re: course of current medical issues. Discussed self-management of symptoms. Outlined signs and symptoms indicating need for more acute intervention. Patient verbalized understanding and all questions were answered. Patient received an After-Visit Summary.    No orders of the defined types were placed in this encounter.  Meds ordered this encounter  Medications   albuterol (VENTOLIN HFA) 108 (90 Base) MCG/ACT inhaler    Sig: Inhale 2 puffs into the lungs every 4 (four) hours as needed for  wheezing or shortness of breath (coughing fits).    Dispense:  18 g    Refill:  11    Keep on file until patient ready to pick up.   fluticasone-salmeterol (ADVAIR) 100-50 MCG/ACT AEPB    Sig: Inhale 1 puff into the lungs 2 (two) times daily.    Dispense:  60 each    Refill:  11   predniSONE (DELTASONE) 20 MG tablet    Sig: 60 mg x3d, 40 mg x3d, 20 mg x2d, 10 mg x2d    Dispense:  18 tablet    Refill:  0   Referral Orders  No referral(s) requested today     Note is dictated utilizing voice recognition software. Although note has been proof read prior to signing, occasional typographical errors still can be missed. If any questions arise, please do not hesitate to call for verification.   electronically signed by:  Felix Pacini, DO  [facility] [place]

## 2022-09-14 NOTE — Patient Instructions (Addendum)

## 2022-09-15 ENCOUNTER — Encounter: Payer: Self-pay | Admitting: Family Medicine

## 2022-09-18 ENCOUNTER — Encounter (HOSPITAL_BASED_OUTPATIENT_CLINIC_OR_DEPARTMENT_OTHER): Payer: Self-pay | Admitting: Pulmonary Disease

## 2022-09-18 ENCOUNTER — Telehealth (HOSPITAL_BASED_OUTPATIENT_CLINIC_OR_DEPARTMENT_OTHER): Payer: Medicare Other | Admitting: Pulmonary Disease

## 2022-09-18 VITALS — Ht 65.0 in | Wt 149.0 lb

## 2022-09-18 DIAGNOSIS — J4550 Severe persistent asthma, uncomplicated: Secondary | ICD-10-CM | POA: Diagnosis not present

## 2022-09-18 DIAGNOSIS — J4530 Mild persistent asthma, uncomplicated: Secondary | ICD-10-CM

## 2022-09-18 DIAGNOSIS — D72119 Hypereosinophilic syndrome (hes), unspecified: Secondary | ICD-10-CM

## 2022-09-18 DIAGNOSIS — G4733 Obstructive sleep apnea (adult) (pediatric): Secondary | ICD-10-CM

## 2022-09-18 DIAGNOSIS — Z87891 Personal history of nicotine dependence: Secondary | ICD-10-CM

## 2022-09-18 NOTE — Progress Notes (Signed)
Virtual Visit via Video Note  I connected with Eric [name] on 09/18/22 at  3:15 PM EDT by a video enabled telemedicine application and verified that I am speaking with the correct person using two identifiers.  Location: Patient: Home Provider: [facility]   I discussed the limitations of evaluation and management by telemedicine and the availability of in person appointments. The patient expressed understanding and agreed to proceed.   I discussed the assessment and treatment plan with the patient. The patient was provided an opportunity to ask questions and all were answered. The patient agreed with the plan and demonstrated an understanding of the instructions.   The patient was advised to call back or seek an in-person evaluation if the symptoms worsen or if the condition fails to improve as anticipated.  I provided 30 minutes of non-face-to-face time during this encounter.   Tavi Gaughran Mechele Collin, MD   Subjective:   PATIENT ID: Eric [name] GENDER: male DOB: Oct 06, 1955, MRN: [id number]  Chief Complaint  Patient presents with   Asthma    Reason for Visit: Follow-up  Eric [name] is a 67 year old male with asthma, allergic rhinitis, CAD, HLD, GERD who presents for follow-up.  Initial consult He previously had exercise-induced asthma in late adulthood. He is active at baseline however began having recurrent asthmatic bronchitis flares.  [facility] Note from 12/10/21 reviewed. Has had worsening DOE and cough in the last two months since having acute bronchitis in September and then COVID-19 in October treated with Paxlovid. Had recurrent flare at the end of October and again in the beginning of November.  He is on high dose Symbicort, xopenex, singulair. Every time he completes a steroid taper, his symptoms return. He has some chest tightness, cough with deep inspiration. Denies nocturnal symptoms. Has tachycardia  at times and sleep issues.   Previously treated with allergies related to leaves etc however recent (June 2023) allergy panel negative. Reports some gluten intolerance.  01/16/22 He reports he is compliant with his Advair. Able to exercise regularly and not needing albuterol. He does have some chest tightness at night. He does notice that sometimes he feels restricted while running. Sometimes will wake up at night with headache. This seemed to have started after COVID. Wife reports snoring at night. Denies withnessed apnea. He naps daily. Falls asleep easily.   03/23/22 He reports a cold at the end of December requiring steroids taper and Symbicort. This has resolved. Denies current shortness of breath, cough or wheezing. However he reports after receiving the covid booster on 03/02/22 he developed chest tightness x 2 weeks. He is not currently on any maintenance inhalers. Only needs albuterol inhaler. He is still exercising. He has been compliant with oxygen nightly.  06/26/22 Since our last visit overall doing well with some nasal congestion. Using nasocort. Has been compliant with his CPAP since our last visit. Unable to tell if his sleep quality has improved. He is compliant with Symbicort. Not using rescue inhaler or nebulizer. No exacerbations since our last visit. Active at baseline. No shortness of breath, cough or wheezing  09/18/22 He was doing well until 2 weeks ago but had mild asthma exacerbation (chest tightness, mild cough) which he believes is related to vaccine. Had weaned himself of Symbicort one month ago. Restarted Symbicort when started steroids and improving. Using albuterol 3 times a day and extra dose with exercise if needed. Has been compliant  with his CPAP nightly except for the night of the power outage. Does not feel benefit from an energy perspective but does feel like he is moving air better at night with the humidifier.  Social History: Negligible smoking history. 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     Family History  Problem Relation Age of Onset   Hypertension Mother    Breast cancer Mother    Hearing loss Mother    Hyperlipidemia Father    Hypertension Father    Dementia Father    Heart disease Father    Prostate cancer Father    Heart attack Father        Paternal uncle also had MI   Depression Father    Obesity Father    Alcohol abuse Sister    Allergic rhinitis Brother    Hypertension Brother    Alcohol abuse Brother    Coronary artery disease Paternal Uncle    Heart attack Paternal Uncle    Heart attack Paternal Uncle    Coronary artery disease Paternal Uncle    Heart disease Paternal Grandfather    Angioedema Other    Eczema Other    Colon cancer Neg Hx    Colon polyps Neg Hx    Pancreatic cancer Neg Hx    Rectal cancer Neg Hx    Stomach cancer Neg Hx      Social History   Occupational History   Occupation: Production designer, theatre/television/film    Comment: Health and safety inspector  Tobacco Use   Smoking status: Former    Current packs/day: 0.00    Average packs/day: 1 pack/day for 2.0 years (2.0 ttl pk-yrs)    Types: Cigarettes    Start date: 07/03/1969    Quit date: 07/04/1971    Years since quitting: 51.2    Passive exposure: Never   Smokeless tobacco: Never  Vaping Use   Vaping status: Never Used  Substance and Sexual Activity   Alcohol use: Yes    Alcohol/week: 7.0 standard drinks of alcohol     Types: 7 Glasses of wine per week   Drug use: No   Sexual activity: Not Currently    Partners: Female    Birth control/protection: Surgical    Comment: vasectomy    Allergies  Allergen Reactions   Other     Other Reaction(s): Eye Irritation    sinus problems   Gluten Meal Diarrhea and Other (See Comments)    GI     Outpatient Medications Prior to Visit  Medication Sig Dispense Refill   albuterol (VENTOLIN HFA) 108 (90 Base) MCG/ACT inhaler Inhale 2 puffs into the lungs every 4 (four) hours as needed for wheezing or shortness of breath (coughing fits). 18 g 11   ASPIRIN 81 PO Take 1 tablet by mouth daily as needed.     atorvastatin (LIPITOR)  80 MG tablet Take 1 tablet (80 mg total) by mouth at bedtime. 90 tablet 3   b complex vitamins capsule Take 1 capsule by mouth daily.     fluticasone-salmeterol (ADVAIR) 100-50 MCG/ACT AEPB Inhale 1 puff into the lungs 2 (two) times daily. 60 each 11   Omeprazole 20 MG TBEC Take 40 mg by mouth.     predniSONE (DELTASONE) 20 MG tablet 60 mg x3d, 40 mg x3d, 20 mg x2d, 10 mg x2d 18 tablet 0   Probiotic Product (PROBIOTIC ADVANCED PO) Take by mouth.     Spacer/Aero-Holding Chambers (AEROCHAMBER MV) inhaler Use as instructed 1 each 2   TH MAGNESIUM PO Take by mouth.     albuterol (PROVENTIL) (2.5 MG/3ML) 0.083% nebulizer solution Take 3 mLs (2.5 mg total) by nebulization every 4 (four) hours as needed for wheezing or shortness of breath (coughing fits). (Patient not taking: Reported on 09/14/2022) 75 mL 1   No facility-administered medications prior to visit.    Review of Systems  Constitutional:  Negative for chills, diaphoresis, fever, malaise/fatigue and weight loss.  HENT:  Negative for congestion.   Respiratory:  Negative for cough, hemoptysis, sputum production, shortness of breath and wheezing.   Cardiovascular:  Negative for chest pain (mild chest tightness), palpitations and leg swelling.     Objective:   Vitals:   09/18/22 1503   Weight: 149 lb (67.6 kg)  Height: 5\' 5"  (1.651 m)      Physical Exam: General: Well-appearing, no acute distress HENT: [place], AT Eyes: EOMI, no scleral icterus Respiratory: No respiratory distress Neuro: AAO x4, CNII-XII grossly intact Psych: Normal mood, normal affect  Data Reviewed:  Imaging: CT Cardiac 07/28/21 - Visualized lung parenchyma with no pulmonary nodules, masses, infiltrate, effusion or pneumothorax. CXR 12/08/21 - Mild bronchitic wall thickening  PFT: Spirometry 07/22/21 FVC 3.94 (104%) FEV1 2.93 (104%) Ratio 74%  Sleep: Home sleep test 05/04/22 - AHI 23.4 with nadir SpO2 76% on room air Interpretation: Moderate sleep apnea with desaturations  Labs: CBC    Component Value Date/Time   WBC 5.8 03/23/2022 0955   WBC 6.7 02/05/2022 1052   RBC 5.16 03/23/2022 0955   RBC 5.23 02/05/2022 1052   HGB 14.8 03/23/2022 0955   HCT 45.5 03/23/2022 0955   PLT 397 03/23/2022 0955   MCV 88 03/23/2022 0955   MCH 28.7 03/23/2022 0955   MCH 29.3 01/13/2022 1058   MCHC 32.5 03/23/2022 0955   MCHC 33.0 02/05/2022 1052   RDW 13.2 03/23/2022 0955   LYMPHSABS 2.0 03/23/2022 0955   MONOABS 0.8 02/05/2022 1052   EOSABS 0.7 (H) 03/23/2022 0955   BASOSABS 0.1 03/23/2022 0955   Absolute eos  12/08/21 - 3300 12/19/21 - 500 12/24/21 - 400 01/13/22 - 300 02/05/22 - 900 03/23/22 - 700  IgE 12/24/21 - 201  CPAP Compliance 08/19/22-09/17/22 29/30 days (97%) >4 hours 27 (97%) Avg use 7 hours 18 min AHI 3.2    Assessment & Plan:   Discussion: 67 year old male with asthma, allergic rhinitis, CAD, HLD, GERD who presents for follow-up for eosinophilic asthma and OSA. History of multiple exacerbations in setting of severe peripheral eosinophilia from Sept to Nov 2023. Peak abs eos 3300. Seen by Hematology with thought this is secondary to acute COVID. Recent peripheral eosinophilia improved to 300-400 range. Most recently in 700 range however asymptomatic with PRN SABA use.   Overall  well-controlled asthma with mild exacerbation on 09/2022 treated with prednisone.   Reviewed OSA management as  noted below. He is compliant with AHI <5. Continue current management.  Moderate obstructive sleep apnea --Patient uses NIV for more than four hours nightly for at least 70% of nights during the last three months of usage. The patient has been using and benefiting from PAP use and will continue to benefit from therapy.  --Educated regarding the long term cardiovascular benefits of treating sleep apnea --Advised patient to wear CPAP for at least 4 hours each night for greater than 70% of the time to avoid the machine being repossessed by insurance. 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 OK to titrate down once symptoms improve --CONTINUE Albuterol AS NEEDED --Normal spirometry on 08/05/22 with allergy  Health Maintenance Immunization History  Administered Date(s) Administered   Influenza Split 12/23/2011, 11/28/2018, 11/21/2019   Influenza,inj,Quad PF,6+ Mos 01/17/2014, 12/11/2014, 12/11/2015, 12/11/2016, 11/14/2020   Moderna Covid-19 Vaccine Bivalent Booster 1yrs & up 10/21/2020   Moderna Sars-Covid-2 Vaccination 05/23/2020   PFIZER(Purple Top)SARS-COV-2 Vaccination 04/09/2019, 05/02/2019, 11/21/2019, 05/23/2020   PNEUMOCOCCAL CONJUGATE-20 09/09/2021   Pfizer Covid-19 Vaccine Bivalent Booster 64yrs & up 10/21/2020   Td 05/15/2008   CT Lung Screen - not qualified  No orders of the defined types were placed in this encounter.  No orders of the defined types were placed in this encounter.   Return in about 6 months (around 03/21/2023).  I have spent a total time of 30-minutes on the day of the appointment including chart  review, data review, collecting history, coordinating care and discussing medical diagnosis and plan with the patient/family. Past medical history, allergies, medications were reviewed. Pertinent imaging, labs and tests included in this note have been reviewed and interpreted independently by me.  Jacee Enerson Mechele Collin, MD [facility] 09/18/2022 3:54 PM

## 2022-09-24 ENCOUNTER — Other Ambulatory Visit: Payer: Self-pay | Admitting: Family Medicine

## 2022-09-28 ENCOUNTER — Ambulatory Visit: Payer: Medicare Other | Admitting: Family Medicine

## 2022-09-29 ENCOUNTER — Other Ambulatory Visit: Payer: Self-pay

## 2022-09-29 ENCOUNTER — Telehealth: Payer: Self-pay

## 2022-09-29 MED ORDER — ATORVASTATIN CALCIUM 80 MG PO TABS: 80.0000 mg | ORAL_TABLET | Freq: Every day | ORAL | 0 refills | Status: DC

## 2022-09-29 NOTE — Telephone Encounter (Signed)
Patient was scheduled for CPE 8/28 with Dr. Claiborne Billings, first available appt is 10/7.  Patient will be out of meds on 8/29. Can we please call in 60 or 90 d/s to get him thru until appt?  atorvastatin (LIPITOR) 80 MG tablet   [facility] [address] [place] - [address]

## 2022-09-29 NOTE — Telephone Encounter (Signed)
Refill sent.

## 2022-09-30 ENCOUNTER — Ambulatory Visit: Payer: Medicare Other | Admitting: Family Medicine

## 2022-09-30 ENCOUNTER — Other Ambulatory Visit: Payer: Self-pay | Admitting: Family Medicine

## 2022-10-12 ENCOUNTER — Encounter: Payer: Self-pay | Admitting: Allergy

## 2022-10-13 ENCOUNTER — Ambulatory Visit
Admission: RE | Admit: 2022-10-13 | Discharge: 2022-10-13 | Disposition: A | Discharge status: Home / Self Care | Payer: Medicare Other | Source: Ambulatory Visit | Attending: Sports Medicine | Admitting: Sports Medicine

## 2022-10-13 ENCOUNTER — Other Ambulatory Visit: Payer: Self-pay | Admitting: Sports Medicine

## 2022-10-13 DIAGNOSIS — M25522 Pain in left elbow: Secondary | ICD-10-CM

## 2022-10-27 ENCOUNTER — Ambulatory Visit: Payer: Medicare Other | Admitting: Allergy

## 2022-10-27 ENCOUNTER — Encounter: Payer: Self-pay | Admitting: Family Medicine

## 2022-10-27 ENCOUNTER — Other Ambulatory Visit: Payer: Self-pay | Admitting: Family Medicine

## 2022-11-02 NOTE — Progress Notes (Deleted)
Follow Up Note  RE: Eric [name] MRN: [id number] DOB: [birth year] Date of Office Visit: 11/03/2022  Referring provider: Natalia Leatherwood, DO Primary care provider: Natalia Leatherwood, DO  Chief Complaint: No chief complaint on file.  History of Present Illness: I had the pleasure of seeing Eric [name] for a follow up visit at the [facility] on 11/02/2022. He is a 67 y.o. male, who is being followed for asthma, GERD, chronic rhinitis and history of eosinophilia. His previous allergy office visit was on 08/05/2022 with Dr. Selena Batten. Today is a regular follow up visit.  Discussed the use of AI scribe software for clinical note transcription with the patient, who gave verbal consent to proceed.  History of Present Illness            Moderate persistent asthma without complication Past history - Noted shortness of breath with swimming and first mile running. Sometimes has coughing and wheezing at the end. Runs 6 miles consistently. Denies any prior asthma diagnosis or inhaler use. 2023 spirometry was normal. Interim history - currently on Symbicort 1 puff BID and doing well. Sometimes premedicates prior to exercise but able to run 10 miles and bike 12 miles. Still planning on scuba diving. Had prednisone 1 month ago by ENT for nasal congestion. Followed by pulm as well.  Today's spirometry was normal.  Daily controller medication(s): Decrease Symbicort 1 puff to ONCE a day x 2 weeks and then stop. If you notice issues then okay to continue Symbicort as before.  During respiratory infections/flares:  May use levoalbuterol rescue inhaler 2 puffs every 4 to 6 hours as needed for shortness of breath, chest tightness, coughing, and wheezing. May use albuterol rescue inhaler 2 puffs 5 to 15 minutes prior to strenuous physical activities. Monitor frequency of use.  Keep follow up with pulmonology. I recommended that he premedicated with levoalbuterol prior to scuba diving  and to hold off scuba diving if his breathing is not controlled.  Get spirometry at next visit.   GERD (gastroesophageal reflux disease) Symptomatic if misses PPI dose.  Continue lifestyle and dietary modifications. Continue omeprazole 40mg  once day - nothing to eat or drink for 20-30 minutes afterwards.  Follow up with GI.   Chronic rhinitis Past history - Some mild symptoms in the spring and fall. Tried zyrtec with no benefit. Skin testing in 2006 was positive to mold per patient. No prior AIT. 2023 skin prick testing was negative to indoor/outdoor allergens. 2023 environmental panel borderline positive to dog and cat.  Interim history - some nasal congestion still. Saw ENT and given prednisone with some benefit.  Continue environmental control measures.  Use Flonase (fluticasone) or Nasacort nasal spray 1 spray per nostril twice a day as needed for nasal congestion.    Peripheral eosinophilia Past history - 12/08/2021 cbc diff showed eos of 3300. No recent eos to compare to. Last eos in 2017 was 230. Patient had about 3lbs weight loss the last 1-2 months but also not eating as much and exercising less as he has not been feeling well with his bouts of bronchitis and Covid-19. Also taking more NSAIDs. Started aspirin in spring 2023. Heme eval done - no further f/u needed. 03/23/22 eos 700. No need to repeat today.  Assessment and Plan: Eric [name] is a 67 y.o. male with: *** Assessment and Plan              No follow-ups on file.  No orders  of the defined types were placed in this encounter.  Lab Orders  No laboratory test(s) ordered today    Diagnostics: Spirometry:  Tracings reviewed. His effort: {Blank single:19197::"Good reproducible efforts.","It was hard to get consistent efforts and there is a question as to whether this reflects a maximal maneuver.","Poor effort, data can not be interpreted."} FVC: ***L FEV1: ***L, ***% predicted FEV1/FVC ratio: ***% Interpretation:  {Blank single:19197::"Spirometry consistent with mild obstructive disease","Spirometry consistent with moderate obstructive disease","Spirometry consistent with severe obstructive disease","Spirometry consistent with possible restrictive disease","Spirometry consistent with mixed obstructive and restrictive disease","Spirometry uninterpretable due to technique","Spirometry consistent with normal pattern","No overt abnormalities noted given today's efforts"}.  Please see scanned spirometry results for details.  Skin Testing: {Blank single:19197::"Select foods","Environmental allergy panel","Environmental allergy panel and select foods","Food allergy panel","None","Deferred due to recent antihistamines use"}. *** Results discussed with patient/family.   Medication List:  Current Outpatient Medications  Medication Sig Dispense Refill   albuterol (PROVENTIL) (2.5 MG/3ML) 0.083% nebulizer solution Take 3 mLs (2.5 mg total) by nebulization every 4 (four) hours as needed for wheezing or shortness of breath (coughing fits). (Patient not taking: Reported on 09/14/2022) 75 mL 1   albuterol (VENTOLIN HFA) 108 (90 Base) MCG/ACT inhaler Inhale 2 puffs into the lungs every 4 (four) hours as needed for wheezing or shortness of breath (coughing fits). 18 g 11   ASPIRIN 81 PO Take 1 tablet by mouth daily as needed.     atorvastatin (LIPITOR) 80 MG tablet TAKE 1 TABLET BY MOUTH AT BEDTIME 30 tablet 0   b complex vitamins capsule Take 1 capsule by mouth daily.     fluticasone-salmeterol (ADVAIR) 100-50 MCG/ACT AEPB Inhale 1 puff into the lungs 2 (two) times daily. 60 each 11   Omeprazole 20 MG TBEC Take 40 mg by mouth.     predniSONE (DELTASONE) 20 MG tablet 60 mg x3d, 40 mg x3d, 20 mg x2d, 10 mg x2d 18 tablet 0   Probiotic Product (PROBIOTIC ADVANCED PO) Take by mouth.     Spacer/Aero-Holding Chambers (AEROCHAMBER MV) inhaler Use as instructed 1 each 2   TH MAGNESIUM PO Take by mouth.     No current  facility-administered medications for this visit.   Allergies: Allergies  Allergen Reactions   Other     Other Reaction(s): Eye Irritation    sinus problems   Gluten Meal Diarrhea and Other (See Comments)    GI   I reviewed his past medical history, social history, family history, and environmental history and no significant changes have been reported from his previous visit.  Review of Systems  Constitutional:  Negative for appetite change, chills, fever and unexpected weight change.  HENT:  Positive for congestion and postnasal drip. Negative for rhinorrhea.   Eyes:  Negative for itching.  Respiratory:  Negative for cough, chest tightness, shortness of breath and wheezing.   Gastrointestinal:  Negative for abdominal pain.  Skin:  Negative for rash.  Neurological:  Negative for headaches.    Objective: There were no vitals taken for this visit. There is no height or weight on file to calculate BMI. Physical Exam Vitals and nursing note reviewed.  Constitutional:      Appearance: Normal appearance. He is well-developed.  HENT:     Head: Normocephalic and atraumatic.     Right Ear: Tympanic membrane and external ear normal.     Left Ear: Tympanic membrane and external ear normal.     Nose: Nose normal.     Mouth/Throat:  Mouth: Mucous membranes are moist.     Pharynx: Oropharynx is clear.  Eyes:     Conjunctiva/sclera: Conjunctivae normal.  Cardiovascular:     Rate and Rhythm: Normal rate and regular rhythm.     Heart sounds: Normal heart sounds. No murmur heard. Pulmonary:     Effort: Pulmonary effort is normal.     Breath sounds: Normal breath sounds. No wheezing, rhonchi or rales.  Musculoskeletal:     Cervical back: Neck supple.  Skin:    General: Skin is warm.     Findings: No rash.  Neurological:     Mental Status: He is alert and oriented to person, place, and time.  Psychiatric:        Behavior: Behavior normal.    Previous notes and tests were  reviewed. The plan was reviewed with the patient/family, and all questions/concerned were addressed.  It was my pleasure to see Eric [name] today and participate in his care. Please feel free to contact me with any questions or concerns.  Sincerely,  Wyline Mood, DO Allergy & Immunology  [facility] office: [phone] [facility] office: [phone]

## 2022-11-03 ENCOUNTER — Ambulatory Visit: Payer: Medicare Other | Admitting: Family Medicine

## 2022-11-03 ENCOUNTER — Ambulatory Visit (INDEPENDENT_AMBULATORY_CARE_PROVIDER_SITE_OTHER): Payer: Medicare Other | Admitting: Allergy

## 2022-11-03 ENCOUNTER — Ambulatory Visit: Payer: Medicare Other | Admitting: Allergy

## 2022-11-03 ENCOUNTER — Encounter: Payer: Self-pay | Admitting: Allergy

## 2022-11-03 VITALS — BP 116/72 | HR 58 | Temp 98.1°F | Resp 16

## 2022-11-03 DIAGNOSIS — Z862 Personal history of diseases of the blood and blood-forming organs and certain disorders involving the immune mechanism: Secondary | ICD-10-CM

## 2022-11-03 DIAGNOSIS — J4540 Moderate persistent asthma, uncomplicated: Secondary | ICD-10-CM | POA: Diagnosis not present

## 2022-11-03 DIAGNOSIS — K219 Gastro-esophageal reflux disease without esophagitis: Secondary | ICD-10-CM

## 2022-11-03 DIAGNOSIS — J310 Chronic rhinitis: Secondary | ICD-10-CM | POA: Diagnosis not present

## 2022-11-03 NOTE — Progress Notes (Signed)
Follow Up Note  RE: Eric [name] MRN: [id number] DOB: 04-23-1955 Date of Office Visit: 11/03/2022  Referring provider: Natalia Leatherwood, DO Primary care provider: Natalia Leatherwood, DO  Chief Complaint: Follow-up and Asthma (No asthma other than exercise induced. )  History of Present Illness: I had the pleasure of seeing Eric [name] for a follow up visit at the [facility] on 11/03/2022. He is a 67 y.o. male, who is being followed for asthma, GERD, chronic rhinitis and history of eosinophilia. His previous allergy office visit was on 08/05/2022 with Dr. Selena Batten. Today is a regular follow up visit.  Discussed the use of AI scribe software for clinical note transcription with the patient, who gave verbal consent to proceed.  The patient, with a history of asthma, has been managing his condition with Symbicort ( ), taking one puff in the morning and one puff in the evening. He also uses Levalbuterol as a pre-exercise inhaler for running and rowing. The patient has attempted to reduce the Symbicort dosage in the past but has not done so recently. He expresses a desire to eventually wean off the inhaler completely.  The patient has experienced two bouts of asthma following vaccinations, leading to a concern about a potential link between the two. However both times he weaned off Symbicort at the same time.   In addition to asthma, the patient has been dealing with allergies, using various steroid nasal sprays for relief. He has found steroid sprays to be effective in managing his symptoms. The patient also has a history of reflux, for which he has been taking Omeprazole for an extended period.   The patient is physically active, participating in rowing and running several times a week. He has not had any recent emergency room visits or urgent care needs related to his asthma.   Assessment and Plan: Enrrique is a 67 y.o. male with: Moderate persistent asthma without  complication Past history - Noted shortness of breath with swimming and first mile running. Sometimes has coughing and wheezing at the end. Runs 6 miles consistently. Denies any prior asthma diagnosis or inhaler use. 2023 spirometry was normal. Interim history - doing well. Didn't wean off inhaler.  Daily controller medication(s): Symbicort 1 puff twice a day and rinse mouth after each use.  Get flu shot/covid vaccine - try to get one at a time.  If no issues you can decrease Symbicort to 1 puff once a day. During respiratory infections/asthma flares: Increase Symbicort 160 mcg 2 puffs twice a day for 1-2 weeks until your breathing symptoms return to baseline.  May use levoalbuterol rescue inhaler 2 puffs every 4 to 6 hours as needed for shortness of breath, chest tightness, coughing, and wheezing. May use levoalbuterol rescue inhaler 2 puffs 5 to 15 minutes prior to strenuous physical activities. Monitor frequency of use - if you need to use it more than twice per week on a consistent basis let us know.   Chronic rhinitis Past history - Some mild symptoms in the spring and fall. Tried zyrtec with no benefit. Skin testing in 2006 was positive to mold per patient. No prior AIT. 2023 skin prick testing was negative to indoor/outdoor allergens. 2023 environmental panel borderline positive to dog and cat.  Interim history - minimal symptoms.  Continue environmental control measures.  Use Flonase (fluticasone) or Nasacort or Nasonex nasal spray 1 spray per nostril twice a day as needed for nasal congestion.   Gastroesophageal reflux disease,  unspecified whether esophagitis present Continue lifestyle and dietary modifications. Continue omeprazole 40mg  once day - nothing to eat or drink for 20-30 minutes afterwards.  Follow up with GI.  Return in about 4 months (around 03/06/2023).  No orders of the defined types were placed in this encounter.  Lab Orders  No laboratory test(s) ordered today     Diagnostics: None.   Medication List:  Current Outpatient Medications  Medication Sig Dispense Refill   albuterol (VENTOLIN HFA) 108 (90 Base) MCG/ACT inhaler Inhale 2 puffs into the lungs every 4 (four) hours as needed for wheezing or shortness of breath (coughing fits). 18 g 11   ASPIRIN 81 PO Take 1 tablet by mouth daily as needed.     atorvastatin (LIPITOR) 80 MG tablet TAKE 1 TABLET BY MOUTH AT BEDTIME 30 tablet 0   b complex vitamins capsule Take 1 capsule by mouth daily.     budesonide-formoterol (SYMBICORT) 160-4.5 MCG/ACT inhaler Inhale 2 puffs into the lungs 2 (two) times daily.     Omeprazole 20 MG TBEC Take 40 mg by mouth.     Probiotic Product (PROBIOTIC ADVANCED PO) Take by mouth.     Spacer/Aero-Holding Chambers (AEROCHAMBER MV) inhaler Use as instructed 1 each 2   TH MAGNESIUM PO Take by mouth.     No current facility-administered medications for this visit.   Allergies: Allergies  Allergen Reactions   Other     Other Reaction(s): Eye Irritation    sinus problems   Gluten Meal Diarrhea and Other (See Comments)    GI   I reviewed his past medical history, social history, family history, and environmental history and no significant changes have been reported from his previous visit.  Review of Systems  Constitutional:  Negative for appetite change, chills, fever and unexpected weight change.  HENT:  Negative for congestion, postnasal drip and rhinorrhea.   Eyes:  Negative for itching.  Respiratory:  Negative for cough, chest tightness, shortness of breath and wheezing.   Gastrointestinal:  Negative for abdominal pain.  Skin:  Negative for rash.  Neurological:  Negative for headaches.    Objective: BP 116/72 (BP Location: Right Arm, Patient Position: Sitting, Cuff Size: Normal)   Pulse (!) 58   Temp 98.1 F (36.7 C) (Temporal)   Resp 16   SpO2 98%  There is no height or weight on file to calculate BMI. Physical Exam Vitals and nursing note reviewed.   Constitutional:      Appearance: Normal appearance. He is well-developed.  HENT:     Head: Normocephalic and atraumatic.     Right Ear: Tympanic membrane and external ear normal.     Left Ear: Tympanic membrane and external ear normal.     Nose: Nose normal.     Mouth/Throat:     Mouth: Mucous membranes are moist.     Pharynx: Oropharynx is clear.  Eyes:     Conjunctiva/sclera: Conjunctivae normal.  Cardiovascular:     Rate and Rhythm: Normal rate and regular rhythm.     Heart sounds: Normal heart sounds. No murmur heard. Pulmonary:     Effort: Pulmonary effort is normal.     Breath sounds: Normal breath sounds. No wheezing, rhonchi or rales.  Musculoskeletal:     Cervical back: Neck supple.  Skin:    General: Skin is warm.     Findings: No rash.  Neurological:     Mental Status: He is alert and oriented to person, place, and time.  Psychiatric:  Behavior: Behavior normal.    Previous notes and tests were reviewed. The plan was reviewed with the patient/family, and all questions/concerned were addressed.  It was my pleasure to see Bell today and participate in his care. Please feel free to contact me with any questions or concerns.  Sincerely,  Wyline Mood, DO Allergy & Immunology  [facility] office: [phone] [facility] office: [phone]

## 2022-11-03 NOTE — Patient Instructions (Addendum)
Breathing Daily controller medication(s): Symbicort 1 puff twice a day and rinse mouth after each use.  Get flu shot/covid vaccine - try to get one at a time.  If no issues you can decrease Symbicort to 1 puff once a day.  During respiratory infections/asthma flares: Increase Symbicort 160 mcg 2 puffs twice a day for 1-2 weeks until your breathing symptoms return to baseline.  May use levoalbuterol rescue inhaler 2 puffs every 4 to 6 hours as needed for shortness of breath, chest tightness, coughing, and wheezing. May use levoalbuterol rescue inhaler 2 puffs 5 to 15 minutes prior to strenuous physical activities. Monitor frequency of use - if you need to use it more than twice per week on a consistent basis let us know.  Breathing control goals:  Full participation in all desired activities (may need albuterol before activity) Albuterol use two times or less a week on average (not counting use with activity) Cough interfering with sleep two times or less a month Oral steroids no more than once a year No hospitalizations   Reflux Continue lifestyle and dietary modifications. Continue omeprazole 40mg  once day - nothing to eat or drink for 20-30 minutes afterwards.  Follow up with GI.  Rhinitis 2023 skin testing was negative to indoor/outdoor allergens.  2023 bloodwork was borderline to cat and dog. Continue environmental control measures.  Use Flonase (fluticasone) or Nasacort or Nasonex nasal spray 1 spray per nostril twice a day as needed for nasal congestion.   Follow up in 4 months or sooner if needed.  Pet Allergen Avoidance: Contrary to popular opinion, there are no "hypoallergenic" breeds of dogs or cats. That is because people are not allergic to an animal's hair, but to an allergen found in the animal's saliva, dander (dead skin flakes) or urine. Pet allergy symptoms typically occur within minutes. For some people, symptoms can build up and become most severe 8 to 12 hours  after contact with the animal. People with severe allergies can experience reactions in public places if dander has been transported on the pet owners' clothing. Keeping an animal outdoors is only a partial solution, since homes with pets in the yard still have higher concentrations of animal allergens. Before getting a pet, ask your allergist to determine if you are allergic to animals. If your pet is already considered part of your family, try to minimize contact and keep the pet out of the bedroom and other rooms where you spend a great deal of time. As with dust mites, vacuum carpets often or replace carpet with a hardwood floor, tile or linoleum. High-efficiency particulate air (HEPA) cleaners can reduce allergen levels over time. While dander and saliva are the source of cat and dog allergens, urine is the source of allergens from rabbits, hamsters, mice and [place] pigs; so ask a non-allergic family member to clean the animal's cage. If you have a pet allergy, talk to your allergist about the potential for allergy immunotherapy (allergy shots). This strategy can often provide long-term relief.

## 2022-11-09 ENCOUNTER — Ambulatory Visit (INDEPENDENT_AMBULATORY_CARE_PROVIDER_SITE_OTHER): Payer: Medicare Other | Admitting: Family Medicine

## 2022-11-09 ENCOUNTER — Ambulatory Visit: Payer: Medicare Other | Admitting: Allergy

## 2022-11-09 ENCOUNTER — Encounter: Payer: Self-pay | Admitting: Family Medicine

## 2022-11-09 VITALS — BP 138/88 | HR 65 | Ht 66.54 in | Wt 154.8 lb

## 2022-11-09 DIAGNOSIS — I77810 Thoracic aortic ectasia: Secondary | ICD-10-CM

## 2022-11-09 DIAGNOSIS — R7303 Prediabetes: Secondary | ICD-10-CM

## 2022-11-09 DIAGNOSIS — K219 Gastro-esophageal reflux disease without esophagitis: Secondary | ICD-10-CM | POA: Diagnosis not present

## 2022-11-09 DIAGNOSIS — G4733 Obstructive sleep apnea (adult) (pediatric): Secondary | ICD-10-CM

## 2022-11-09 DIAGNOSIS — Z8249 Family history of ischemic heart disease and other diseases of the circulatory system: Secondary | ICD-10-CM

## 2022-11-09 DIAGNOSIS — R931 Abnormal findings on diagnostic imaging of heart and coronary circulation: Secondary | ICD-10-CM

## 2022-11-09 DIAGNOSIS — I77811 Abdominal aortic ectasia: Secondary | ICD-10-CM

## 2022-11-09 DIAGNOSIS — E785 Hyperlipidemia, unspecified: Secondary | ICD-10-CM

## 2022-11-09 DIAGNOSIS — I2510 Atherosclerotic heart disease of native coronary artery without angina pectoris: Secondary | ICD-10-CM

## 2022-11-09 DIAGNOSIS — Z125 Encounter for screening for malignant neoplasm of prostate: Secondary | ICD-10-CM | POA: Diagnosis not present

## 2022-11-09 LAB — COMPREHENSIVE METABOLIC PANEL
ALT: 49 U/L (ref 0–53)
AST: 32 U/L (ref 0–37)
Albumin: 4.2 g/dL (ref 3.5–5.2)
Alkaline Phosphatase: 45 U/L (ref 39–117)
BUN: 29 mg/dL — ABNORMAL HIGH (ref 6–23)
CO2: 26 meq/L (ref 19–32)
Calcium: 9.5 mg/dL (ref 8.4–10.5)
Chloride: 105 meq/L (ref 96–112)
Creatinine, Ser: 1.09 mg/dL (ref 0.40–1.50)
GFR: 70.68 mL/min (ref >60.00)
Glucose, Bld: 111 mg/dL — ABNORMAL HIGH (ref 70–99)
Potassium: 4.8 meq/L (ref 3.5–5.1)
Sodium: 138 meq/L (ref 135–145)
Total Bilirubin: 0.5 mg/dL (ref 0.2–1.2)
Total Protein: 6.4 g/dL (ref 6.0–8.3)

## 2022-11-09 LAB — CBC WITH DIFFERENTIAL/PLATELET
Basophils Absolute: 0.1 10*3/uL (ref 0.0–0.1)
Basophils Relative: 1 % (ref 0.0–3.0)
Eosinophils Absolute: 0.3 10*3/uL (ref 0.0–0.7)
Eosinophils Relative: 6.9 % — ABNORMAL HIGH (ref 0.0–5.0)
HCT: 45.6 % (ref 39.0–52.0)
Hemoglobin: 14.7 g/dL (ref 13.0–17.0)
Lymphocytes Relative: 34.1 % (ref 12.0–46.0)
Lymphs Abs: 1.7 10*3/uL (ref 0.7–4.0)
MCHC: 32.3 g/dL (ref 30.0–36.0)
MCV: 88.8 fL (ref 78.0–100.0)
Monocytes Absolute: 0.5 10*3/uL (ref 0.1–1.0)
Monocytes Relative: 9.1 % (ref 3.0–12.0)
Neutro Abs: 2.5 10*3/uL (ref 1.4–7.7)
Neutrophils Relative %: 48.9 % (ref 43.0–77.0)
Platelets: 270 10*3/uL (ref 150.0–400.0)
RBC: 5.14 Mil/uL (ref 4.22–5.81)
RDW: 14.5 % (ref 11.5–15.5)
WBC: 5.1 10*3/uL (ref 4.0–10.5)

## 2022-11-09 LAB — LIPID PANEL
Cholesterol: 144 mg/dL (ref 0–200)
HDL: 74.5 mg/dL (ref >39.00)
LDL Cholesterol: 61 mg/dL (ref 0–99)
NonHDL: 69.2
Total CHOL/HDL Ratio: 2
Triglycerides: 40 mg/dL (ref 0.0–149.0)
VLDL: 8 mg/dL (ref 0.0–40.0)

## 2022-11-09 LAB — PSA, MEDICARE: PSA: 1.59 ng/mL (ref 0.10–4.00)

## 2022-11-09 LAB — HEMOGLOBIN A1C: Hgb A1c MFr Bld: 5.7 % (ref 4.6–6.5)

## 2022-11-09 LAB — TSH: TSH: 2.24 u[IU]/mL (ref 0.35–5.50)

## 2022-11-09 MED ORDER — ATORVASTATIN CALCIUM 80 MG PO TABS: 80.0000 mg | ORAL_TABLET | Freq: Every day | ORAL | 3 refills | Status: DC

## 2022-11-09 MED ORDER — ATORVASTATIN CALCIUM 80 MG PO TABS: 80.0000 mg | ORAL_TABLET | Freq: Every day | ORAL | 0 refills | Status: DC

## 2022-11-09 NOTE — Progress Notes (Signed)
Patient ID: Eric [name], male  DOB: 07/27/1955, 67 y.o.   MRN: [id number] Patient Care Team    Relationship Specialty Notifications Start End  Natalia Leatherwood, DO PCP - General Family Medicine  06/06/21   [facility] Consulting Physician Optometry  06/09/21   Ellamae Sia, DO Consulting Physician Allergy  11/09/22   Luciano Cutter, MD Consulting Physician Pulmonary Disease  11/09/22   [name], MD Consulting Physician Cardiology  11/09/22     Chief Complaint  Patient presents with   Annual Exam    Pt is fasting; [facility]    Subjective: SANAV SPILLMAN is a 67 y.o.  male present for Chronic Conditions/illness Management  All past medical history, surgical history, allergies, family history, immunizations, medications and social history were updated in the electronic medical record today. All recent labs, ED visits and hospitalizations within the last year were reviewed.  He has a history of coronary artery disease, hyperlipidemia, family history of premature CAD.  He is compliant with atorvastatin 80 mg daily.  He has had and elevated calcium score in 06/2021-score 276/73rd percentile .  Since moving to the area he had noticed increased asthma, headaches and brain fog.  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  At one time was on chronic steroids, now managed with Symbicort and albuterol.  Diagnosis of moderate obstructive sleep apnea.      11/09/2022    8:08 AM 09/09/2022    3:42 PM 02/05/2022   11:59 AM 09/09/2021    8:13 AM 06/06/2021    1:46 PM  Depression screen PHQ 2/9  Decreased Interest 0 0 0 0 0  Down, Depressed, Hopeless 0 0 0 0 0  PHQ - 2 Score 0 0 0 0 0       No data to display                 11/09/2022    8:08 AM 11/05/2022    2:01 PM 09/29/2022   12:02 PM 09/24/2022    4:38 PM 09/05/2022   12:46 PM  Fall Risk   Falls in the past year? 0 0 0 0 0  Number falls in past yr: 0     0  Injury with Fall? 0    0  Risk for fall due to : No Fall Risks    Impaired vision  Follow up Falls evaluation completed    Falls prevention discussed    Immunization History  Administered Date(s) Administered   Influenza Split 12/23/2011, 11/28/2018, 11/21/2019   Influenza,inj,Quad PF,6+ Mos 01/17/2014, 12/11/2014, 12/11/2015, 12/11/2016, 11/14/2020   Moderna Covid-19 Vaccine Bivalent Booster 63yrs & up 10/21/2020   Moderna Sars-Covid-2 Vaccination 05/23/2020   PFIZER(Purple Top)SARS-COV-2 Vaccination 04/09/2019, 05/02/2019, 11/21/2019, 05/23/2020   PNEUMOCOCCAL CONJUGATE-20 09/09/2021   Pfizer Covid-19 Vaccine Bivalent Booster 49yrs & up 10/21/2020   Td 05/15/2008    No results found.  Past Medical History:  Diagnosis Date   Allergies    Allergy 1976   Asthma    Bronchitis due to COVID-19 virus 12/09/2021   Chicken pox    Chronic rhinitis 07/22/2021   Coronary artery disease, non-occlusive    Elevated Coronary Calcium Score - Non-obstructive CAD (30-50%) on Coronary CTA   Dietary counseling and surveillance 07/22/2021   Dislocation of shoulder, left, closed    Eczema    Ganglion cyst 09/17/2010   GERD (gastroesophageal reflux  disease)    Gluten intolerance    Leads to diffuse GI issues   Heart disease    Hyperlipidemia with target LDL less than 70 07/23/2016   Kidney disease    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   Coronary CT Angiograpm  July-August 2018   1) Calcium score 147 which is 74th percentile for age and sex 2) Right dominant coronary arteries with Less than 30% calcified plaque in proximal LAD. Less than 50% calcified plaque  in proximal RCA, Less than 30% soft plauqe in mid RCA and Less than 30% calcified plaque in distal RCA   MOUTH SURGERY  1965   TOE SURGERY  2010   TONSILLECTOMY  1962   VASECTOMY     Family History  Problem Relation Age of Onset   Hypertension Mother    Breast cancer Mother    Hearing loss Mother    Hyperlipidemia Father    Hypertension Father    Dementia Father    Heart disease Father    Prostate cancer Father    Heart attack Father        Paternal uncle also had MI   Depression Father    Obesity Father    Alcohol abuse Sister    Allergic rhinitis Brother    Hypertension Brother    Alcohol abuse Brother    Coronary artery disease Paternal Uncle    Heart attack Paternal Uncle    Heart attack Paternal Uncle    Coronary artery disease Paternal Uncle    Heart disease Paternal Grandfather    Angioedema Other    Eczema Other    Colon cancer Neg Hx    Colon polyps Neg Hx    Pancreatic cancer Neg Hx    Rectal cancer Neg Hx    Stomach cancer Neg Hx    Social History   Social History Narrative   He is been married for 37 years.    Masters degree executive VP for [employer]   He is an avid exercise enthusiast - runs, bicycling and swimming at least 5 days a week for 40 minutes in time.   Has smoke alarm in the home   Wears a seatbelt.   Feels safe in his relationship.    Allergies as of 11/09/2022       Reactions   Other    Other Reaction(s): Eye Irritation    sinus problems   Gluten Meal Diarrhea, Other (See Comments)   GI        Medication List        Accurate as of November 09, 2022  8:25 AM. If you have any questions, ask your nurse or doctor.          STOP taking these medications    AeroChamber MV inhaler Stopped by: Felix Pacini       TAKE these medications    albuterol 108 (90 Base) MCG/ACT inhaler Commonly known as: Ventolin HFA Inhale 2 puffs into the lungs every 4 (four) hours as needed for wheezing or shortness of breath (coughing  fits).   ASPIRIN 81 PO  Take 1 tablet by mouth daily as needed.   atorvastatin 80 MG tablet Commonly known as: LIPITOR Take 1 tablet (80 mg total) by mouth at bedtime.   b complex vitamins capsule Take 1 capsule by mouth daily.   budesonide-formoterol 160-4.5 MCG/ACT inhaler Commonly known as: SYMBICORT Inhale 2 puffs into the lungs 2 (two) times daily.   Omeprazole 20 MG Tbec Take 40 mg by mouth.   PROBIOTIC ADVANCED PO Take by mouth.   TH MAGNESIUM PO Take by mouth.        All past medical history, surgical history, allergies, family history, immunizations andmedications were updated in the EMR today and reviewed under the history and medication portions of their EMR.    No results found for this or any previous visit (from the past 2160 hour(s)).    ROS 14 pt review of systems performed and negative (unless mentioned in an HPI)  Objective: BP 138/88   Pulse 65   Ht 5' 6.54" (1.69 m)   Wt 154 lb 12.8 oz (70.2 kg)   SpO2 98%   BMI 24.59 kg/m  Physical Exam Constitutional:      General: He is not in acute distress.    Appearance: Normal appearance. He is not ill-appearing, toxic-appearing or diaphoretic.  HENT:     Head: Normocephalic and atraumatic.     Right Ear: Tympanic membrane, ear canal and external ear normal. There is no impacted cerumen.     Left Ear: Tympanic membrane, ear canal and external ear normal. There is no impacted cerumen.     Nose: Nose normal. No congestion or rhinorrhea.     Mouth/Throat:     Mouth: Mucous membranes are moist.     Pharynx: Oropharynx is clear. No oropharyngeal exudate or posterior oropharyngeal erythema.  Eyes:     General: No scleral icterus.       Right eye: No discharge.        Left eye: No discharge.     Extraocular Movements: Extraocular movements intact.     Pupils: Pupils are equal, round, and reactive to light.  Cardiovascular:     Rate and Rhythm: Normal rate and regular rhythm.     Pulses: Normal  pulses.     Heart sounds: Normal heart sounds. No murmur heard.    No friction rub. No gallop.  Pulmonary:     Effort: Pulmonary effort is normal. No respiratory distress.     Breath sounds: Normal breath sounds. No stridor. No wheezing, rhonchi or rales.  Chest:     Chest wall: No tenderness.  Abdominal:     General: Abdomen is flat. Bowel sounds are normal. There is no distension.     Palpations: Abdomen is soft. There is no mass.     Tenderness: There is no abdominal tenderness. There is no right CVA tenderness, left CVA tenderness, guarding or rebound.     Hernia: No hernia is present.  Musculoskeletal:        General: No swelling or tenderness. Normal range of motion.     Cervical back: Normal range of motion and neck supple.     Right lower leg: No edema.     Left lower leg: No edema.  Lymphadenopathy:     Cervical: No cervical adenopathy.  Skin:    General: Skin is warm and dry.     Coloration: Skin is not jaundiced.     Findings: No bruising, lesion or rash.  Neurological:     General: No focal  deficit present.     Mental Status: He is alert and oriented to person, place, and time. Mental status is at baseline.     Cranial Nerves: No cranial nerve deficit.     Sensory: No sensory deficit.     Motor: No weakness.     Coordination: Coordination normal.     Gait: Gait normal.     Deep Tendon Reflexes: Reflexes normal.  Psychiatric:        Mood and Affect: Mood normal.        Behavior: Behavior normal.        Thought Content: Thought content normal.        Judgment: Judgment normal.       Assessment/plan: JAAMAL SANDRA is a 67 y.o. male present for Chronic Conditions/illness Management Hyperlipidemia with target LDL <70/CAD/family history of premature CAD/elevated coronary calcium score  Has been doing well Continue atorvastatin 80 mg nightly Continue baby aspirin 07/2021: coronary calcium 06/2021: 73th percentile; 276 CBC, CMP, TSH and lipids collected  today  Ascending aortic dilatation/abdominal aortic ectasia: 09/2021 ABD Aortic ectasia measuring up to 2.8 cm.>Recommend follow-up ultrasound every 5 years.  07/2021: Ascending Aorta: Mildly dilated (38.5 mm). (CT CHEST)  Elevated A1c: A1c collected today  GERD: OTC Prilosec B12, Vitamin D and mag every 2 years-UTD 2023 Continue B12 supplement, magnesium and vitamin D supplements   Return in about 24 weeks (around 04/26/2023) for Routine chronic condition follow-up.  Orders Placed This Encounter  Procedures   Comprehensive metabolic panel   Hemoglobin A1c   TSH   Lipid panel   CBC w/Diff   PSA, Medicare ( [place] only)   Meds ordered this encounter  Medications   DISCONTD: atorvastatin (LIPITOR) 80 MG tablet    Sig: Take 1 tablet (80 mg total) by mouth at bedtime.    Dispense:  30 tablet    Refill:  0   atorvastatin (LIPITOR) 80 MG tablet    Sig: Take 1 tablet (80 mg total) by mouth at bedtime.    Dispense:  90 tablet    Refill:  3   Referral Orders  No referral(s) requested today      Note is dictated utilizing voice recognition software. Although note has been proof read prior to signing, occasional typographical errors still can be missed. If any questions arise, please do not hesitate to call for verification.  Electronically signed by: Felix Pacini, DO [facility] [place]

## 2022-11-09 NOTE — Patient Instructions (Addendum)

## 2022-11-17 ENCOUNTER — Other Ambulatory Visit: Payer: Self-pay | Admitting: Sports Medicine

## 2022-11-17 DIAGNOSIS — M25522 Pain in left elbow: Secondary | ICD-10-CM

## 2022-11-17 DIAGNOSIS — G8929 Other chronic pain: Secondary | ICD-10-CM

## 2022-11-27 ENCOUNTER — Ambulatory Visit
Admission: RE | Admit: 2022-11-27 | Discharge: 2022-11-27 | Disposition: A | Discharge status: Home / Self Care | Payer: Medicare Other | Source: Ambulatory Visit | Attending: Sports Medicine | Admitting: Sports Medicine

## 2022-11-27 DIAGNOSIS — M25522 Pain in left elbow: Secondary | ICD-10-CM

## 2022-11-27 DIAGNOSIS — G8929 Other chronic pain: Secondary | ICD-10-CM

## 2022-12-25 ENCOUNTER — Other Ambulatory Visit: Payer: Self-pay | Admitting: *Deleted

## 2022-12-25 ENCOUNTER — Encounter: Payer: Self-pay | Admitting: Allergy

## 2022-12-25 MED ORDER — BUDESONIDE-FORMOTEROL FUMARATE 160-4.5 MCG/ACT IN AERO: 2.0000 | INHALATION_SPRAY | Freq: Two times a day (BID) | RESPIRATORY_TRACT | 5 refills | Status: DC

## 2022-12-28 ENCOUNTER — Encounter: Payer: Self-pay | Admitting: Allergy

## 2022-12-28 ENCOUNTER — Telehealth: Payer: Self-pay

## 2022-12-28 NOTE — Telephone Encounter (Signed)
Please call patient and ask him to be seen.  I have openings this afternoon in [place] office.  Thank you.

## 2022-12-28 NOTE — Telephone Encounter (Signed)
Patient messaged Dr. Selena Batten about issues with wheezing. I called the patient and left a message for him to call the [place] office back to schedule and appointment with Dr. Selena Batten.

## 2022-12-28 NOTE — Telephone Encounter (Signed)
Patient has an appointment tomorrow with Dr. Selena Batten.

## 2022-12-29 ENCOUNTER — Encounter: Payer: Self-pay | Admitting: Allergy

## 2022-12-29 ENCOUNTER — Ambulatory Visit (INDEPENDENT_AMBULATORY_CARE_PROVIDER_SITE_OTHER): Payer: Medicare Other | Admitting: Allergy

## 2022-12-29 VITALS — BP 134/82 | HR 61 | Temp 97.7°F | Resp 18

## 2022-12-29 DIAGNOSIS — J4541 Moderate persistent asthma with (acute) exacerbation: Secondary | ICD-10-CM

## 2022-12-29 DIAGNOSIS — K219 Gastro-esophageal reflux disease without esophagitis: Secondary | ICD-10-CM | POA: Diagnosis not present

## 2022-12-29 DIAGNOSIS — J310 Chronic rhinitis: Secondary | ICD-10-CM | POA: Diagnosis not present

## 2022-12-29 MED ORDER — PREDNISONE 10 MG PO TABS: ORAL_TABLET | ORAL | 0 refills | Status: DC

## 2022-12-29 NOTE — Telephone Encounter (Signed)
I called and spoke with the patient, he states that he has an appointment this afternoon to see Dr. Selena Batten.

## 2022-12-29 NOTE — Patient Instructions (Addendum)
Take prednisone 40mg  daily x 2 days, 30mg  daily x 2 days, 20mg  daily x 2 days and 10mg  daily x 2 days.  Breathing For the next 2 weeks: START Symbicort 2 puffs twice a day with spacer and rinse mouth afterwards. Once you feel better go down to Symbicort 1 puff twice a day as your maintenance dose.  Daily controller medication(s): Restart Symbicort 1 puff twice a day and rinse mouth after each use.  During respiratory infections/asthma flares: Increase Symbicort 160 mcg 2 puffs twice a day for 1-2 weeks until your breathing symptoms return to baseline.  May use levoalbuterol rescue inhaler 2 puffs every 4 to 6 hours as needed for shortness of breath, chest tightness, coughing, and wheezing. May use levoalbuterol rescue inhaler 2 puffs 5 to 15 minutes prior to strenuous physical activities. Monitor frequency of use - if you need to use it more than twice per week on a consistent basis let us know.  Breathing control goals:  Full participation in all desired activities (may need albuterol before activity) Albuterol use two times or less a week on average (not counting use with activity) Cough interfering with sleep two times or less a month Oral steroids no more than once a year No hospitalizations   Reflux Continue lifestyle and dietary modifications. Continue omeprazole 40mg  once day - nothing to eat or drink for 20-30 minutes afterwards.  Follow up with GI.  Rhinitis 2023 skin testing was negative to indoor/outdoor allergens.  2023 bloodwork was borderline to cat and dog. Continue environmental control measures.  Use Flonase (fluticasone) nasal spray 1-2 sprays per nostril once a day as needed for nasal congestion.  Nasal saline spray (i.e., Simply Saline) or nasal saline lavage (i.e., NeilMed) is recommended as needed and prior to medicated nasal sprays.  Follow up in 3 months or sooner if needed.  Pet Allergen Avoidance: Contrary to popular opinion, there are no  "hypoallergenic" breeds of dogs or cats. That is because people are not allergic to an animal's hair, but to an allergen found in the animal's saliva, dander (dead skin flakes) or urine. Pet allergy symptoms typically occur within minutes. For some people, symptoms can build up and become most severe 8 to 12 hours after contact with the animal. People with severe allergies can experience reactions in public places if dander has been transported on the pet owners' clothing. Keeping an animal outdoors is only a partial solution, since homes with pets in the yard still have higher concentrations of animal allergens. Before getting a pet, ask your allergist to determine if you are allergic to animals. If your pet is already considered part of your family, try to minimize contact and keep the pet out of the bedroom and other rooms where you spend a great deal of time. As with dust mites, vacuum carpets often or replace carpet with a hardwood floor, tile or linoleum. High-efficiency particulate air (HEPA) cleaners can reduce allergen levels over time. While dander and saliva are the source of cat and dog allergens, urine is the source of allergens from rabbits, hamsters, mice and [place] pigs; so ask a non-allergic family member to clean the animal's cage. If you have a pet allergy, talk to your allergist about the potential for allergy immunotherapy (allergy shots). This strategy can often provide long-term relief.

## 2022-12-29 NOTE — Progress Notes (Signed)
Follow Up Note  RE: Eric [name] MRN: [id number] DOB: 1955-10-07 Date of Office Visit: 12/29/2022  Referring provider: Natalia Leatherwood, DO Primary care provider: Natalia Leatherwood, DO  Chief Complaint: Wheezing (Was already wheezing but Started to get worse right before the weekend.Some SOB)  History of Present Illness: I had the pleasure of seeing Eric [name] for a follow up visit at the [facility] on 12/29/2022. He is a 67 y.o. male, who is being followed for asthma, chronic rhinitis, GERD. His previous allergy office visit was on 11/03/2022 with Dr. Selena Batten. Today is a new complaint visit of wheezing .  Discussed the use of AI scribe software for clinical note transcription with the patient, who gave verbal consent to proceed.  The patient, with a history of asthma, presents with a recent exacerbation of symptoms. He reports that a few months ago, following his COVID-19 vaccination, he noticed a mild increase in his asthma symptoms. This was further exacerbated by a cold he contracted a couple of weeks later. Since then, his symptoms have been progressively worsening, culminating in wheezing and coughing, particularly at night. The patient describes a sensation of not getting enough oxygen.   The patient is an active individual and recently completed a half marathon. He used his albuterol inhaler at the start of the race and once during, due to the return of his symptoms. Despite the use of the inhaler, his symptoms have persisted, with wheezing and shortness of breath, particularly when lying down.  The patient had been on Symbicort, but had been trying to wean himself off it. He had reduced his dosage to one puff once a day for about a month, and then stopped using it altogether about two to three weeks ago when he ran out. He has not yet picked up his new prescription.  The patient also uses Flonase once a day, which he reports helps his nasal congestion considerably. He  has not taken any oral steroids (prednisone) for this recent exacerbation. The last time he took prednisone was in August, prescribed by his PCP for similar asthma symptoms. He also had a few steroid injections for non-asthma related issues this year.     No fevers/chills.   Assessment and Plan: Eric [name] is a 67 y.o. male with: Moderate persistent asthma with acute exacerbation Past history - Noted shortness of breath with swimming and first mile running. Sometimes has coughing and wheezing at the end. Runs 6 miles consistently. Denies any prior asthma diagnosis or inhaler use. 2023 spirometry was normal. Interim history - Stopped Symbicort, having some symptoms now. He is not sure if it's due to stopping inhaler vs getting covid-19 vaccine vs recent URI. Last prednisone in August.  Today's spirometry was normal with 23% improvement in FEV1 post bronchodilator treatment. Clinically feeling improved.  Start prednisone taper.  For the next 2 weeks: START Symbicort 2 puffs twice a day with spacer and rinse mouth afterwards. Once you feel better go down to Symbicort 1 puff twice a day as your maintenance dose. Daily controller medication(s): Restart Symbicort 1 puff twice a day and rinse mouth after each use.  Unlikely to be able to wean off inhaler completely but will wean down slowly as tolerated.  During respiratory infections/asthma flares: Increase Symbicort 160 mcg 2 puffs twice a day for 1-2 weeks until your breathing symptoms return to baseline.  May use levoalbuterol rescue inhaler 2 puffs every 4 to 6 hours as needed  for shortness of breath, chest tightness, coughing, and wheezing. May use levoalbuterol rescue inhaler 2 puffs 5 to 15 minutes prior to strenuous physical activities. Monitor frequency of use - if you need to use it more than twice per week on a consistent basis let us know.  Get spirometry at next visit.  Chronic rhinitis Past history - Some mild symptoms in the  spring and fall. Tried zyrtec with no benefit. Skin testing in 2006 was positive to mold per patient. No prior AIT. 2023 skin prick testing was negative to indoor/outdoor allergens. 2023 environmental panel borderline positive to dog and cat.  Continue environmental control measures.  Use Flonase (fluticasone) nasal spray 1-2 sprays per nostril once a day as needed for nasal congestion.  Nasal saline spray (i.e., Simply Saline) or nasal saline lavage (i.e., NeilMed) is recommended as needed and prior to medicated nasal sprays.   Gastroesophageal reflux disease, unspecified whether esophagitis present Stable. Continue lifestyle and dietary modifications. Continue omeprazole 40mg  once day - nothing to eat or drink for 20-30 minutes afterwards.  Follow up with GI.  Return in about 3 months (around 03/31/2023).  Meds ordered this encounter  Medications   predniSONE (DELTASONE) 10 MG tablet    Sig: Take prednisone 40mg  daily x 2 days, 30mg  daily x 2 days, 20mg  daily x 2 days and 10mg  daily x 2 days.    Dispense:  20 tablet    Refill:  0   Lab Orders  No laboratory test(s) ordered today    Diagnostics: Spirometry:  Tracings reviewed. His effort: Good reproducible efforts. FVC: 3.35L FEV1: 2.55L, 92% predicted FEV1/FVC ratio: 76% Interpretation: Spirometry consistent with normal pattern with 23% improvement in FEV1 post bronchodilator treatment. Clinically feeling improved.   Please see scanned spirometry results for details.  Medication List:  Current Outpatient Medications  Medication Sig Dispense Refill   albuterol (VENTOLIN HFA) 108 (90 Base) MCG/ACT inhaler Inhale 2 puffs into the lungs every 4 (four) hours as needed for wheezing or shortness of breath (coughing fits). 18 g 11   ASPIRIN 81 PO Take 1 tablet by mouth daily as needed.     atorvastatin (LIPITOR) 80 MG tablet Take 1 tablet (80 mg total) by mouth at bedtime. 90 tablet 3   b complex vitamins capsule Take 1 capsule by  mouth daily.     budesonide-formoterol (SYMBICORT) 160-4.5 MCG/ACT inhaler Inhale 2 puffs into the lungs 2 (two) times daily.     budesonide-formoterol (SYMBICORT) 160-4.5 MCG/ACT inhaler Inhale 2 puffs into the lungs in the morning and at bedtime. 10.2 g 5   Omeprazole 20 MG TBEC Take 40 mg by mouth.     predniSONE (DELTASONE) 10 MG tablet Take prednisone 40mg  daily x 2 days, 30mg  daily x 2 days, 20mg  daily x 2 days and 10mg  daily x 2 days. 20 tablet 0   Probiotic Product (PROBIOTIC ADVANCED PO) Take by mouth.     TH MAGNESIUM PO Take by mouth.     No current facility-administered medications for this visit.   Allergies: Allergies  Allergen Reactions   Other     Other Reaction(s): Eye Irritation    sinus problems   Gluten Meal Diarrhea and Other (See Comments)    GI   I reviewed his past medical history, social history, family history, and environmental history and no significant changes have been reported from his previous visit.  Review of Systems  Constitutional:  Negative for appetite change, chills, fever and unexpected weight change.  HENT:  Negative for congestion, postnasal drip and rhinorrhea.   Eyes:  Negative for itching.  Respiratory:  Positive for wheezing. Negative for cough, chest tightness and shortness of breath.   Gastrointestinal:  Negative for abdominal pain.  Skin:  Negative for rash.  Neurological:  Negative for headaches.    Objective: BP 134/82   Pulse 61   Temp 97.7 F (36.5 C)   Resp 18   SpO2 99%  There is no height or weight on file to calculate BMI. Physical Exam Vitals and nursing note reviewed.  Constitutional:      Appearance: Normal appearance. He is well-developed.  HENT:     Head: Normocephalic and atraumatic.     Right Ear: Tympanic membrane and external ear normal.     Left Ear: Tympanic membrane and external ear normal.     Nose: Nose normal.     Mouth/Throat:     Mouth: Mucous membranes are moist.     Pharynx: Oropharynx is  clear.  Eyes:     Conjunctiva/sclera: Conjunctivae normal.  Cardiovascular:     Rate and Rhythm: Normal rate and regular rhythm.     Heart sounds: Normal heart sounds. No murmur heard. Pulmonary:     Effort: Pulmonary effort is normal.     Breath sounds: Normal breath sounds. No wheezing, rhonchi or rales.  Musculoskeletal:     Cervical back: Neck supple.  Skin:    General: Skin is warm.     Findings: No rash.  Neurological:     Mental Status: He is alert and oriented to person, place, and time.  Psychiatric:        Behavior: Behavior normal.   Previous notes and tests were reviewed. The plan was reviewed with the patient/family, and all questions/concerned were addressed.  It was my pleasure to see Eric [name] today and participate in his care. Please feel free to contact me with any questions or concerns.  Sincerely,  Wyline Mood, DO Allergy & Immunology  [facility] office: [phone] [facility] office: [phone]

## 2023-01-03 ENCOUNTER — Encounter (INDEPENDENT_AMBULATORY_CARE_PROVIDER_SITE_OTHER): Payer: Self-pay

## 2023-01-08 ENCOUNTER — Ambulatory Visit (INDEPENDENT_AMBULATORY_CARE_PROVIDER_SITE_OTHER): Payer: Medicare Other

## 2023-02-09 ENCOUNTER — Other Ambulatory Visit (HOSPITAL_COMMUNITY): Payer: Self-pay | Attending: Oncology

## 2023-03-11 ENCOUNTER — Telehealth: Payer: Self-pay | Admitting: Cardiology

## 2023-03-11 ENCOUNTER — Encounter (HOSPITAL_BASED_OUTPATIENT_CLINIC_OR_DEPARTMENT_OTHER): Payer: Self-pay | Admitting: Pulmonary Disease

## 2023-03-11 NOTE — Telephone Encounter (Signed)
   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 Dozier Surgeon's Group or Practice Name:  [facility]  Phone number:  [phone] Fax number:  [phone]   Type of Clearance Requested:   - Medical  - Pharmacy:  Hold Aspirin Def to Cards   Type of Anesthesia:   Choice   Additional requests/questions:    SignedRojelio Kays   03/11/2023, 4:28 PM

## 2023-03-12 NOTE — Telephone Encounter (Signed)
 Pt is scheduled to see Dr. Micael Adas, DOD, 03/15/23, clearance will be addressed at that time.  Will send back to the requesting surgeon's office to make them aware.

## 2023-03-12 NOTE — Telephone Encounter (Signed)
 Fax received from Dr. Eva Herring with [facility] to perform a left elbow lateral epicondyle debridement with tendon repair on patient under choice anesthesia  Patient needs surgery clearance. Surgery is 03/16/23. Patient was seen on 09/08/22. Office protocol is a risk assessment can be sent to surgeon if patient has been seen in 60 days or less.   Sending to Dr. Kassie for risk assessment or recommendations if patient needs to be seen in office prior to surgical procedure.   No openings sooner than his surgery is scheduled for- can you do risk assessment based off last visit?

## 2023-03-12 NOTE — Telephone Encounter (Signed)
    Primary Cardiologist:None  Chart reviewed as part of pre-operative protocol coverage. Because of Eric [name]'s past medical history and time since last visit, he/she will require a follow-up visit in order to better assess preoperative cardiovascular risk.  Pre-op covering staff: - Please schedule in person office appointment and call patient to inform them. - Please contact requesting surgeon's office via preferred method (i.e, phone, fax) to inform them of need for appointment prior to surgery.  If applicable, this message will also be routed to pharmacy pool and/or primary cardiologist for input on holding anticoagulant/antiplatelet agent as requested below so that this information is available at time of patient's appointment.   Eric CHRISTELLA Beauvais, NP  03/12/2023, 1:46 PM

## 2023-03-15 ENCOUNTER — Encounter: Payer: Self-pay | Admitting: Cardiology

## 2023-03-15 ENCOUNTER — Ambulatory Visit: Payer: Medicare Other | Attending: Cardiology | Admitting: Cardiology

## 2023-03-15 VITALS — BP 138/80 | HR 63 | Ht 66.4 in | Wt 155.8 lb

## 2023-03-15 DIAGNOSIS — Z01818 Encounter for other preprocedural examination: Secondary | ICD-10-CM | POA: Diagnosis not present

## 2023-03-15 DIAGNOSIS — E785 Hyperlipidemia, unspecified: Secondary | ICD-10-CM

## 2023-03-15 DIAGNOSIS — I2510 Atherosclerotic heart disease of native coronary artery without angina pectoris: Secondary | ICD-10-CM | POA: Diagnosis present

## 2023-03-15 NOTE — Progress Notes (Addendum)
 Cardiology Note    Date:  03/15/2023   ID:  Eric [name], DOB [birth year], MRN [id number]  PCP:  Eric Shope, DO  Cardiologist:  Eric Keas, MD   Chief Complaint  Patient presents with   Follow-up    Preoperative cardiac clearance, CAD, hyperlipidemia    History of Present Illness:  Eric [name] is a 68 y.o. male with a history of ASCHD with coronary CTA showing 30 to 50% nonobstructive CAD, GERD, hyperlipidemia, CKD and moderate obstructive sleep apnea.  He is here for preoperative clearance to have left elbow surgery for a torn ligament.    He is doing well today. He tells me that he is an avid runner and Catering manager.  He runs 15 miles weekly and once a week will do 8-9 miles once during the week. He has done multiple half marathons in the past year. He can do over 9.25 mets easily with no issues.  He denies any chest pain or pressure, SOB, DOE, PND, orthopnea, LE edema, dizziness, palpitations or syncope. He is compliant with his meds and is tolerating meds with no SE.     Past Medical History:  Diagnosis Date   Allergies    Allergy  1976   Asthma    Bronchitis due to COVID-19 virus 12/09/2021   Chicken pox    Chronic rhinitis 07/22/2021   Coronary artery disease, non-occlusive    Elevated Coronary Calcium  Score - Non-obstructive CAD (30-50%) on Coronary CTA   Dietary counseling and surveillance 07/22/2021   Dislocation of shoulder, left, closed    Eczema    Ganglion cyst 09/17/2010   GERD (gastroesophageal reflux disease)    Gluten intolerance    Leads to diffuse GI issues   Heart disease    Hyperlipidemia with target LDL less than 70 07/23/2016   Kidney disease    borderline CKD 2/3   Moderate obstructive sleep apnea 05/06/2022    Past Surgical History:  Procedure Laterality Date   COLONOSCOPY  10 years ago=Normal   done in [place]; pt unsure MD name, he will look for records.   Coronary CT Angiograpm  July-August 2018   1) Calcium  score 147 which is 74th percentile  for age and sex 2) Right dominant coronary arteries with Less than 30% calcified plaque in proximal LAD. Less than 50% calcified plaque in proximal RCA, Less than 30% soft plauqe in mid RCA and Less than 30% calcified plaque in distal RCA   MOUTH SURGERY  1965   TOE SURGERY  2010   TONSILLECTOMY  1962   VASECTOMY      Current Medications: Current Meds  Medication Sig   albuterol  (VENTOLIN  HFA) 108 (90 Base) MCG/ACT inhaler Inhale 2 puffs into the lungs every 4 (four) hours as needed for wheezing or shortness of breath (coughing fits).   ASPIRIN 81 PO Take 1 tablet by mouth daily as needed.   atorvastatin  (LIPITOR) 80 MG tablet Take 1 tablet (80 mg total) by mouth at bedtime.   b complex vitamins capsule Take 1 capsule by mouth daily.   budesonide -formoterol  (SYMBICORT ) 160-4.5 MCG/ACT inhaler Inhale 2 puffs into the lungs in the morning and at bedtime.   Omeprazole 20 MG TBEC Take 40 mg by mouth.   Probiotic Product (PROBIOTIC ADVANCED PO) Take by mouth.   TH MAGNESIUM PO Take by mouth.    Allergies:   Other and Gluten meal   Social History   Socioeconomic History   Marital status: Married  Spouse name: Not on file   Number of children: 4   Years of education: Not on file   Highest education level: Master's degree (e.g., MA, MS, MEng, MEd, MSW, MBA)  Occupational History   Occupation: Production designer, theatre/television/film    Comment: Health and safety inspector  Tobacco Use   Smoking status: Former    Current packs/day: 0.00    Average packs/day: 1 pack/day for 2.0 years (2.0 ttl pk-yrs)    Types: Cigarettes    Start date: 07/03/1969    Quit date: 07/04/1971    Years since quitting: 51.7    Passive exposure: Never   Smokeless tobacco: Never  Vaping Use   Vaping status: Never Used  Substance and Sexual Activity   Alcohol use: Yes    Alcohol/week: 7.0 standard drinks of alcohol    Types: 7 Glasses of wine per week   Drug use: No   Sexual activity: Not Currently    Partners: Female    Birth control/protection:  Surgical    Comment: vasectomy  Other Topics Concern   Not on file  Social History Narrative   He is been married for 37 years.    Masters degree executive VP for [employer]   He is an avid exercise enthusiast - runs, bicycling and swimming at least 5 days a week for 40 minutes in time.   Has smoke alarm in the home   Wears a seatbelt.   Feels safe in his relationship.   Social Drivers of Corporate investment banker Strain: Low Risk  (11/05/2022)   Overall Financial Resource Strain (CARDIA)    Difficulty of Paying Living Expenses: Not hard at all  Food Insecurity: No Food Insecurity (11/05/2022)   Hunger Vital Sign    Worried About Running Out of Food in the Last Year: Never true    Ran Out of Food in the Last Year: Never true  Transportation Needs: No Transportation Needs (11/05/2022)   PRAPARE - Administrator, Civil Service (Medical): No    Lack of Transportation (Non-Medical): No  Physical Activity: Sufficiently Active (11/05/2022)   Exercise Vital Sign    Days of Exercise per Week: 6 days    Minutes of Exercise per Session: 100 min  Stress: No Stress Concern Present (11/05/2022)   [employer] of Occupational Health - Occupational Stress Questionnaire    Feeling of Stress : Not at all  Social Connections: Moderately Integrated (11/05/2022)   Social Connection and Isolation Panel [NHANES]    Frequency of Communication with Friends and Family: Three times a week    Frequency of Social Gatherings with Friends and Family: Three times a week    Attends Religious Services: Never    Active Member of Clubs or Organizations: Yes    Attends Engineer, structural: More than 4 times per year    Marital Status: Married     Family History:  The patient's family history includes Alcohol abuse in his brother and sister; Allergic rhinitis in his brother; Angioedema in an other family member; Breast cancer in his mother; Coronary artery disease in his paternal uncle  and paternal uncle; Dementia in his father; Depression in his father; Eczema in an other family member; Hearing loss in his mother; Heart attack in his father, paternal uncle, and paternal uncle; Heart disease in his father and paternal grandfather; Hyperlipidemia in his father; Hypertension in his brother, father, and mother; Obesity in his father; Prostate cancer in his father.   ROS:   Please  see the history of present illness.    ROS All other systems reviewed and are negative.     07/23/2016    8:43 AM  PAD Screen  Previous PAD dx? No  Previous surgical procedure? No  Pain with walking? Yes  Subsides with rest? Yes  Feet/toe relief with dangling? No  Painful, non-healing ulcers? No  Extremities discolored? No       PHYSICAL EXAM:   VS:  BP 138/80   Pulse 63   Ht 5' 6.4" (1.687 m)   Wt 155 lb 12.8 oz (70.7 kg)   SpO2 91%   BMI 24.84 kg/m    GEN: Well nourished, well developed, in no acute distress  HEENT: normal  Neck: no JVD, carotid bruits, or masses Cardiac: RRR; no murmurs, rubs, or gallops,no edema.  Intact distal pulses bilaterally.  Respiratory:  clear to auscultation bilaterally, normal work of breathing GI: soft, nontender, nondistended, + BS MS: no deformity or atrophy  Skin: warm and dry, no rash Neuro:  Alert and Oriented x 3, Strength and sensation are intact Psych: euthymic mood, full affect  Wt Readings from Last 3 Encounters:  03/15/23 155 lb 12.8 oz (70.7 kg)  11/09/22 154 lb 12.8 oz (70.2 kg)  09/18/22 149 lb (67.6 kg)      Studies/Labs Reviewed:   EKG Interpretation Date/Time:  Monday March 15 2023 08:40:39 EST Ventricular Rate:  60 PR Interval:  186 QRS Duration:  94 QT Interval:  428 QTC Calculation: 428 R Axis:   17  Text Interpretation: Normal sinus rhythm Normal ECG When compared with ECG of 15-Mar-2023 08:12, Criteria for Septal infarct are no longer Present Confirmed by Eric [name] ([id number]) on 03/15/2023 8:45:27 AM      Cardiac Studies & Procedures         CT SCANS  CT CARDIAC SCORING (SELF PAY ONLY) 07/28/2021  Addendum 07/28/2021  1:46 PM ADDENDUM REPORT: 07/28/2021 13:43  CLINICAL DATA:  Cardiovascular Disease Risk stratification  EXAM: Coronary Calcium  Score  TECHNIQUE: A gated, non-contrast computed tomography scan of the heart was performed using 3mm slice thickness. Axial images were analyzed on a dedicated workstation. Calcium  scoring of the coronary arteries was performed using the Agatston method.  FINDINGS: Coronary arteries: Normal origins.  Coronary Calcium  Score:  Left main: 0  Left anterior descending artery: 89.1  Left circumflex artery: 0  Right coronary artery: 187  Total: 276  Percentile: 73rd  Pericardium: Normal.  Ascending Aorta: Mildly dilated (38.5 mm).  Non-cardiac: See separate report from [facility].  IMPRESSION: Coronary calcium  score of 276 Agatston units. This was 73rd percentile for age-, race-, and sex-matched controls.  RECOMMENDATIONS: Coronary artery calcium  (CAC) score is a strong predictor of incident coronary heart disease (CHD) and provides predictive information beyond traditional risk factors. CAC scoring is reasonable to use in the decision to withhold, postpone, or initiate statin therapy in intermediate-risk or selected borderline-risk asymptomatic adults (age 81-75 years and LDL-C >=70 to <190 mg/dL) who do not have diabetes or established atherosclerotic cardiovascular disease (ASCVD).* In intermediate-risk (10-year ASCVD risk >=7.5% to <20%) adults or selected borderline-risk (10-year ASCVD risk >=5% to <7.5%) adults in whom a CAC score is measured for the purpose of making a treatment decision the following recommendations have been made:  If CAC=0, it is reasonable to withhold statin therapy and reassess in 5 to 10 years, as long as higher risk conditions are absent (diabetes mellitus, family history of  premature CHD in first degree relatives (males <  55 years; females <65 years), cigarette smoking, or LDL >=190 mg/dL).  If CAC is 1 to 99, it is reasonable to initiate statin therapy for patients >=32 years of age.  If CAC is >=100 or >=75th percentile, it is reasonable to initiate statin therapy at any age.  Cardiology referral should be considered for patients with CAC scores >=400 or >=75th percentile.  *2018 AHA/ACC/AACVPR/AAPA/ABC/ACPM/ADA/AGS/APhA/ASPC/NLA/PCNA Guideline on the Management of Blood Cholesterol: A Report of the American College of Cardiology/American Heart Association Task Force on Clinical Practice Guidelines. J Am Coll Cardiol. 2019;73(24):3168-3209.  Peder Bourdon, MD   Electronically Signed By: Peder Bourdon M.D. On: 07/28/2021 13:43  Narrative EXAM: OVER-READ INTERPRETATION  CT CHEST  The following report is a limited chest CT over-read performed by radiologist Dr. Alexandria Angel of [facility] on 07/28/2021. The coronary calcium  score interpretation by the cardiologist is attached.  COMPARISON:  Cardiac CT 09/03/2016.  FINDINGS: Within the visualized portions of the thorax there are no suspicious appearing pulmonary nodules or masses, there is no acute consolidative airspace disease, no pleural effusions, no pneumothorax and no lymphadenopathy. Visualized portions of the upper abdomen are unremarkable. There are no aggressive appearing lytic or blastic lesions noted in the visualized portions of the skeleton.  IMPRESSION: 1. No significant incidental noncardiac findings are noted.  Electronically Signed: By: Alexandria Angel M.D. On: 07/28/2021 08:46   CT SCANS  CT CORONARY MORPH W/CTA COR W/SCORE 09/03/2016  Addendum 09/03/2016 11:13 AM ADDENDUM REPORT: 09/03/2016 11:10  CLINICAL DATA:  Chest pain  EXAM: Cardiac CTA  MEDICATIONS: Sub lingual nitro. 4mg  and lopressor mg  TECHNIQUE: The patient was scanned on a  Siemens 192 slice scanner. Gantry rotation speed was 250 msecs. Collimation was .9mm. An 80 kV prospective scan was triggered in the ascending thoracic aorta at 140 HU's with full mA between 65-75% of the R-R interval. Average HR during the scan was 58 bpm. The 3D data set was interpreted on a dedicated work station using MPR, MIP and VRT modes. A total of 80cc of contrast was used.  FINDINGS: Non-cardiac: See separate report from [facility]. No significant findings on limited lung and soft tissue windows.  Calcium  Score: 08/10/16 score 147 calcium  in proximal RCA/LAD and distal RCA  Coronary Arteries: Right dominant with no anomalies  LM: Normal  LAD:  Less than 30% calcified plaque in proximal LAD  D1: Normal  D2: Normal  D3: Normal  Circumflex: Normal  OM1: Normal  OM2: Normal  RCA: Dominant less than 50% calcified plaque in proximal vessel Less than 30% soft plaque in mid vessel Less than 30% calcified plaque in distal vessel  PDA: Normal  PLA:  Normal  IMPRESSION: 1) Calcium  score 147 which is 74th percentile for age and sex  2) Right dominant coronary arteries with Less than 30% calcified plaque in proximal LAD. Less than 50% calcified plaque in proximal RCA, Less than 30% soft plauqe in mid RCA and Less than 30% calcified plaque in distal RCA  Janelle Mediate   Electronically Signed By: Janelle Mediate M.D. On: 09/03/2016 11:10  Narrative EXAM: OVER-READ INTERPRETATION  CT CHEST  The following report is an over-read performed by radiologist Dr. Asenath Blacker [facility] on 09/03/2016. This over-read does not include interpretation of cardiac or coronary anatomy or pathology. The coronary CTA interpretation by the cardiologist is attached.  COMPARISON:  None.  FINDINGS: Cardiovascular: Heart is normal size. Aorta is normal caliber.  Mediastinum/Nodes: No adenopathy in the low  lower mediastinum or hila.  Lungs/Pleura: Lungs  are clear. No focal airspace opacities or suspicious nodules. No effusions.  Upper Abdomen: Imaging into the upper abdomen shows no acute findings.  Musculoskeletal: Chest wall soft tissues are unremarkable. No acute bony abnormality.  IMPRESSION: No acute or significant extracardiac abnormality.  Electronically Signed: By: Janeece Mechanic M.D. On: 09/03/2016 10:30   CT SCANS  CT CARDIAC SCORING (SELF PAY ONLY) 08/10/2016  Addendum 08/10/2016 10:27 AM ADDENDUM REPORT: 08/10/2016 10:25  CLINICAL DATA:  Risk stratification  EXAM: Coronary Calcium  Score  TECHNIQUE: The patient was scanned on a Siemens Somatom 64 slice scanner. Axial non-contrast 3 mm slices were carried out through the heart. The data set was analyzed on a dedicated work station and scored using the Agatson method.  FINDINGS: Non-cardiac: See separate report from [facility].  Ascending Aorta:  3.7 cm  Pericardium: Normal  Coronary arteries: Calcium  noted in proximal, mid and distal RCA as well as proximal LAD  IMPRESSION: Coronary calcium  score of 147. This was 74 th percentile for age and sex matched control.  Janelle Mediate   Electronically Signed By: Janelle Mediate M.D. On: 08/10/2016 10:25  Narrative EXAM: OVER-READ INTERPRETATION  CT CHEST  The following report is an over-read performed by radiologist Dr. Babs Bolster [facility] on 08/10/2016. This over-read does not include interpretation of cardiac or coronary anatomy or pathology. The coronary calcium  score interpretation by the cardiologist is attached.  COMPARISON:  None.  FINDINGS: Within the visualized portions of the thorax there are no suspicious appearing pulmonary nodules or masses, there is no acute consolidative airspace disease, no pleural effusions, no pneumothorax and no lymphadenopathy. Visualized portions of the upper abdomen are unremarkable. There are no aggressive appearing lytic or  blastic lesions noted in the visualized portions of the skeleton.  IMPRESSION: 1. No significant incidental noncardiac findings are noted.  Electronically Signed: By: Alexandria Angel M.D. On: 08/10/2016 09:09          Recent Labs: 11/09/2022: ALT 49; BUN 29; Creatinine, Ser 1.09; Hemoglobin 14.7; Platelets 270.0; Potassium 4.8; Sodium 138; TSH 2.24   Lipid Panel    Component Value Date/Time   CHOL 144 11/09/2022 0820   TRIG 40.0 11/09/2022 0820   HDL 74.50 11/09/2022 0820   CHOLHDL 2 11/09/2022 0820   VLDL 8.0 11/09/2022 0820   LDLCALC 61 11/09/2022 0820   Office visit notes from Dr. Addie Holstein    ASSESSMENT:    1. Pre-op evaluation   2. Coronary artery disease, non-occlusive   3. Hyperlipidemia with target LDL less than 70      PLAN:  In order of problems listed above:  Preoperative cardiovascular examination His perioperative risk of a major cardiac event is 0.9% according to the Revised Cardiac Risk Index (RCRI).  Therefore, the patient is at low risk for perioperative complications.   The patient's  functional capacity is excellent  at over 9.25 METs according to the Duke Activity Status Index (DASI). Recommendations: According to ACC/AHA guidelines, no further cardiovascular testing needed.  The patient may proceed to surgery at acceptable risk.   ASCAD -he has hx of ASCAD with cor Ca 147 and coronary CTA 2018 showed <30% prox LAD and <50% prox RCA, <30% mid and distal RCA -coronary Ca score done was 246 -he is completely asymptomatic from a cardiac standpoint and has done multiple half marathons in the past year and runs 15 miles weekly on the weekends and 1 run 8 miles during the  week -continue prescription drug management with ASA 81mg  daily and Atorvastatin  80mg  daily with PRN refill -I told him it was ok to hold ASA for his surgery -We did discuss concerns he has over doing marathons and going out jogging for long periods of time with known coronary disease.   He says he has heard of people dropping dead with her about working out or after stress test and wonders if there is any further testing that needs to be done -We talked about the role of statins and CAD with not only along lipid-lowering effect but also plaque stabilizing effect.  We discussed the best imaging to determine the effects a plaque may have on blood flow.  Since he has concerns about continued exercise I recommended that we do a stress PET CT which will help rule out any silent ischemia as well as assess blood flow reserve.  I will set this up for him -Informed Consent   Shared Decision Making/Informed Consent The risks [chest pain, shortness of breath, cardiac arrhythmias, dizziness, blood pressure fluctuations, myocardial infarction, stroke/transient ischemic attack, nausea, vomiting, allergic reaction, radiation exposure, metallic taste sensation and life-threatening complications (estimated to be 1 in 10,000)], benefits (risk stratification, diagnosing coronary artery disease, treatment guidance) and alternatives of a cardiac PET stress test were discussed in detail with Mr. Cullom and he agrees to proceed. -He understands that if he starts having chest discomfort or shortness of breath or exertional fatigue he needs to let me know     HDL -LDL boal < 70 -I have personally reviewed and interpreted outside labs performed by patient's PCP which showed LDL 61, HDL 74 and ALT 49 on 12/10/2022 -continue prescription drug management with Atorvastatin  80mg  daily with PRN refills  Time Spent: 20 minutes total time of encounter, including 15 minutes spent in face-to-face patient care on the date of this encounter. This time includes coordination of care and counseling regarding above mentioned problem list. Remainder of non-face-to-face time involved reviewing chart documents/testing relevant to the patient encounter and documentation in the medical record. I have independently reviewed  documentation from referring provider  Followup: 1 year with me  Medication Adjustments/Labs and Tests Ordered: Current medicines are reviewed at length with the patient today.  Concerns regarding medicines are outlined above.  Medication changes, Labs and Tests ordered today are listed in the Patient Instructions below.  There are no Patient Instructions on file for this visit.   Signed, Eric Keas, MD  03/15/2023 8:46 AM    [facility] [address] [place], [place], [place]  [address] Phone: [phone]; Fax: [phone]

## 2023-03-15 NOTE — Addendum Note (Signed)
 Addended by: Cherylyn Cos on: 03/15/2023 09:02 AM   Modules accepted: Orders

## 2023-03-15 NOTE — Telephone Encounter (Signed)
 Peri-operative Assessment of Pulmonary Risk for Non-Thoracic Surgery (left elbow lateral epicondyle debridement with tendon repair on 03/16/23):  For Mr. Abelar, risk of perioperative pulmonary complications is increased by:  [X]  Age greater than 65 years  [ ]  COPD  [ ]  Serum albumin <3.5  [ ]  Smoking  [X] Obstructive sleep apnea  [ ]  NYHA Class II Pulmonary Hypertension  ARISCAT Low Risk 1.6% risk of in-hospital post-op pulmonary complications (composite including respiratory failure, respiratory infection, pleural effusion, atelectasis, pneumothorax, bronchospasm, aspiration pneumonitis)  Respiratory complications generally occur in 1% of ASA Class I patients, 5% of ASA Class II and 10% of ASA Class III-IV patients These complications rarely result in mortality and include postoperative pneumonia, atelectasis, pulmonary embolism, ARDS and increased time requiring postoperative mechanical ventilation.  Overall, I recommend proceeding with the surgery if the risk for respiratory complications are outweighed by the potential benefits. This will need to be discussed between the patient and surgeon.  To reduce risks of respiratory complications, I recommend: --Pre- and post-operative incentive spirometry performed frequently while awake --Inpatient use of currently prescribed positive-pressure (auto CPAP 5-15 cm H20) for OSA whenever the patient is sleeping --Avoiding use of pancuronium during anesthesia.  I have discussed the risk factors and recommendations above with the patient.

## 2023-03-15 NOTE — Patient Instructions (Addendum)
 Medication Instructions:  Your physician recommends that you continue on your current medications as directed. Please refer to the Current Medication list given to you today.  *If you need a refill on your cardiac medications before your next appointment, please call your pharmacy*   Lab Work: None.  If you have labs (blood work) drawn today and your tests are completely normal, you will receive your results only by: MyChart Message (if you have MyChart) OR A paper copy in the mail If you have any lab test that is abnormal or we need to change your treatment, we will call you to review the results.   Testing/Procedures:    Please report to Radiology at the [facility] Main Entrance 30 minutes early for your test.  [address] [place], [place] [address]                        How to Prepare for Your Cardiac PET/CT Stress Test:  Nothing to eat or drink, except water, 3 hours prior to arrival time.  NO caffeine/decaffeinated products, or chocolate 12 hours prior to arrival. (Please note decaffeinated beverages (teas/coffees) still contain caffeine).  If you have caffeine within 12 hours prior, the test will need to be rescheduled.  Medication instructions: Do not take erectile dysfunction medications for 72 hours prior to test (sildenafil, tadalafil) Do not take nitrates (isosorbide mononitrate, Ranexa) the day before or day of test Do not take tamsulosin the day before or morning of test Hold theophylline containing medications for 12 hours. Hold Dipyridamole 48 hours prior to the test.    You may take your remaining medications with water.  NO perfume, cologne or lotion on chest or abdomen area.   Total time is 1 to 2 hours; you may want to bring reading material for the waiting time.  IF YOU THINK YOU MAY BE PREGNANT, OR ARE NURSING PLEASE INFORM THE TECHNOLOGIST.  In preparation for your appointment, medication and supplies will be purchased.  Appointment  availability is limited, so if you need to cancel or reschedule, please call the Radiology Department Scheduler at [phone] 24 hours in advance to avoid a cancellation fee of $100.00  What to Expect When you Arrive:  Once you arrive and check in for your appointment, you will be taken to a preparation room within the Radiology Department.  A technologist or Nurse will obtain your medical history, verify that you are correctly prepped for the exam, and explain the procedure.  Afterwards, an IV will be started in your arm and electrodes will be placed on your skin for EKG monitoring during the stress portion of the exam. Then you will be escorted to the PET/CT scanner.  There, staff will get you positioned on the scanner and obtain a blood pressure and EKG.  During the exam, you will continue to be connected to the EKG and blood pressure machines.  A small, safe amount of a radioactive tracer will be injected in your IV to obtain a series of pictures of your heart along with an injection of a stress agent.    After your Exam:  It is recommended that you eat a meal and drink a caffeinated beverage to counter act any effects of the stress agent.  Drink plenty of fluids for the remainder of the day and urinate frequently for the first couple of hours after the exam.  Your doctor will inform you of your test results within 7-10 business  days.  For more information and frequently asked questions, please visit our website: [email or web address]  For questions about your test or how to prepare for your test, please call: Cardiac Imaging Nurse Navigators Office: [phone]    Follow-Up:   Your next appointment:   1 year(s)  Provider:   Dr. Gaylyn Keas, MD

## 2023-03-15 NOTE — Addendum Note (Signed)
 Addended by: Jacqueline Matsu on: 03/15/2023 09:18 AM   Modules accepted: Orders

## 2023-03-15 NOTE — Addendum Note (Signed)
 Addended by: Cherylyn Cos on: 03/15/2023 09:09 AM   Modules accepted: Orders

## 2023-03-16 ENCOUNTER — Other Ambulatory Visit (HOSPITAL_COMMUNITY): Payer: Self-pay

## 2023-03-16 ENCOUNTER — Telehealth (HOSPITAL_COMMUNITY): Payer: Self-pay | Admitting: Pharmacy Technician

## 2023-03-16 NOTE — Telephone Encounter (Signed)
I do not prescribe any of his inhalers.  I recommend contacting the prescribing provider

## 2023-03-16 NOTE — Telephone Encounter (Signed)
Pharmacy Patient Advocate Encounter   Received notification from  [employer]   that prior authorization for FLUTICASONE-SALMETEROL is required/requested.   Insurance verification completed.   The patient is insured through [facility] .   Received notice from [facility] insurance that pt would need prior approval for continued ues of Fluticasone/salmeterol 100/50 mcg. Pt chart indicates pt now on Symbicort and Advair has been discontinued. Can you advise on current patient therapy?

## 2023-03-22 ENCOUNTER — Other Ambulatory Visit: Payer: Self-pay | Admitting: Family Medicine

## 2023-03-22 NOTE — Telephone Encounter (Signed)
Copied from CRM [id number]. Topic: Clinical - Medication Refill >> Mar 22, 2023  2:15 PM Orinda Kenner C wrote: Most Recent Primary Care Visit:  Provider: Felix Pacini A  Department: [facility]  Visit Type: OFFICE VISIT  Date: 11/09/2022  Medication: Omeprazole DR 40 MG capsule, and atorvastatin (LIPITOR) 80 MG tablet for 90 days supply with 3 refills  Has the patient contacted their pharmacy? Yes (Agent: If no, request that the patient contact the pharmacy for the refill. If patient does not wish to contact the pharmacy document the reason why and proceed with request.) (Agent: If yes, when and what did the pharmacy advise?)  Is this the correct pharmacy for this prescription? Yes If no, delete pharmacy and type the correct one.  This is the patient's preferred pharmacy:   [facility] - [place], [place] - [address] [place] [address] Phone: [phone] Fax: [phone]   Has the prescription been filled recently? No  Is the patient out of the medication? No, will run out of medications.   Has the patient been seen for an appointment in the last year OR does the patient have an upcoming appointment? Yes  Can we respond through MyChart?   Agent: Please be advised that Rx refills may take up to 3 business days. We ask that you follow-up with your pharmacy.

## 2023-03-22 NOTE — Telephone Encounter (Signed)
  Chief Complaint: medication refill  Additional Notes: Called and spoke with patient. Informed him he has refills left on his atorvastatin and to call [facility] with his prescription information and they can pull the prescription from [facility] and fill it. Patient states he was previously taking 2x 20mg  omeprazole OTC daily in the morning and is asking if Dr Claiborne Billings would prescribe 40mg  daily omeprazole to the [facility].

## 2023-03-23 ENCOUNTER — Other Ambulatory Visit: Payer: Self-pay | Admitting: Family Medicine

## 2023-03-23 MED ORDER — ATORVASTATIN CALCIUM 80 MG PO TABS: 80.0000 mg | ORAL_TABLET | Freq: Every day | ORAL | 1 refills | Status: DC

## 2023-03-23 NOTE — Telephone Encounter (Signed)
Copied from CRM [id number]. Topic: Clinical - Medication Refill >> Mar 23, 2023 10:26 AM [place] J wrote: Most Recent Primary Care Visit:  Provider: Felix Pacini A  Department: [facility]  Visit Type: OFFICE VISIT  Date: 11/09/2022  Medication: atorvastatin (LIPITOR) 80 MG tablet  Has the patient contacted their pharmacy? Yes (Agent: If no, request that the patient contact the pharmacy for the refill. If patient does not wish to contact the pharmacy document the reason why and proceed with request.) (Agent: If yes, when and what did the pharmacy advise?)  Is this the correct pharmacy for this prescription? Yes If no, delete pharmacy and type the correct one.  This is the patient's preferred pharmacy:   [facility] - [place], [place] - [address] [place] [address] Phone: [phone] Fax: [phone]  Has the prescription been filled recently? No  Is the patient out of the medication? Yes  Has the patient been seen for an appointment in the last year OR does the patient have an upcoming appointment? Yes  Can we respond through MyChart? Yes  Agent: Please be advised that Rx refills may take up to 3 business days. We ask that you follow-up with your pharmacy.

## 2023-03-29 ENCOUNTER — Encounter: Payer: Self-pay | Admitting: Family Medicine

## 2023-04-01 ENCOUNTER — Ambulatory Visit (INDEPENDENT_AMBULATORY_CARE_PROVIDER_SITE_OTHER): Payer: Medicare Other | Admitting: Allergy

## 2023-04-01 ENCOUNTER — Encounter: Payer: Self-pay | Admitting: Allergy

## 2023-04-01 VITALS — BP 110/84 | HR 56 | Temp 98.2°F | Resp 16 | Wt 153.0 lb

## 2023-04-01 DIAGNOSIS — K219 Gastro-esophageal reflux disease without esophagitis: Secondary | ICD-10-CM | POA: Diagnosis not present

## 2023-04-01 DIAGNOSIS — J310 Chronic rhinitis: Secondary | ICD-10-CM

## 2023-04-01 DIAGNOSIS — J4540 Moderate persistent asthma, uncomplicated: Secondary | ICD-10-CM | POA: Diagnosis not present

## 2023-04-01 MED ORDER — LEVALBUTEROL TARTRATE 45 MCG/ACT IN AERO
2.0000 | INHALATION_SPRAY | RESPIRATORY_TRACT | 2 refills | Status: DC | PRN
Start: 2023-04-01 — End: 2023-07-29

## 2023-04-01 MED ORDER — LEVALBUTEROL TARTRATE 45 MCG/ACT IN AERO
2.0000 | INHALATION_SPRAY | RESPIRATORY_TRACT | 2 refills | Status: DC | PRN
Start: 2023-04-01 — End: 2023-04-01

## 2023-04-01 NOTE — Progress Notes (Signed)
 Follow Up Note  RE: Eric [name] MRN: [id number] DOB: 10/31/1955 Date of Office Visit: 04/01/2023  Referring provider: Natalia Leatherwood, DO Primary care provider: Natalia Leatherwood, DO  Chief Complaint: Follow-up (Breathing has been doing good since been being on Advair. )  History of Present Illness: I had the pleasure of seeing Eric [name] for a follow up visit at the [facility] on 04/01/2023. He is a 68 y.o. male, who is being followed for asthma, chronic rhinitis, GERD. His previous allergy office visit was on 12/29/2022 with Dr. Selena Batten. Today is a regular follow up visit.  Discussed the use of AI scribe software for clinical note transcription with the patient, who gave verbal consent to proceed.    He has a history of exercise-induced asthma, which worsened after discontinuing Symbicort in November following a half marathon. He resumed Advair , taking one puff twice daily, which he finds effective. He experiences morning drainage and throat clearing, which have improved since restarting Advair. He uses albuterol 2 puffs as a rescue inhaler, primarily before running or rowing which helps.  He runs approximately fifteen miles a week and has completed three half marathons in the past six months. He notes that his breathing can be challenging during the first mile of a run and occasionally throughout the entire run, despite using albuterol. He is not sure if levoalbuterol worked better or not. He did notice that Advair helped with his EIB.   He experiences allergic rhinitis, managed with a nasal spray (generic Flonase) once a day and oral antihistamines daily, especially after a severe allergy attack triggered by leaf mold.  He takes omeprazole daily for reflux but has not yet consulted a GI specialist.  He underwent elbow surgery for a torn tendon due to tennis elbow, which has limited his rowing activities. He also experiences achy joints, particularly in his knees,  which he attributes to poor diet. He is currently on the [facility] diet, which he feels has improved his joint pain and running ease.  He uses a CPAP machine at night, which he finds beneficial for his breathing.      Assessment and Plan: [name] is a 68 y.o. male with: Moderate persistent asthma Past history - Noted shortness of breath with swimming and first mile running. Sometimes has coughing and wheezing at the end. Runs 6 miles consistently. Denies any prior asthma diagnosis or inhaler use. 2023 spirometry was normal. Interim history - Improved with Advair 1 puff BID. Occasional difficulty with breathing during runs despite pre-run Albuterol use. Today's spirometry was normal. Daily controller medication(s): continue Advair 1 puff twice a day and rinse mouth after each use.  May decrease Advair to 1 puff once a day. If you notice worsening breathing issues then go back up to 1 puff twice a day.  May use levo/albuterol rescue inhaler 2 to 4 puffs every 4 to 6 hours as needed for shortness of breath, chest tightness, coughing, and wheezing. May use levo/albuterol rescue inhaler 2 to 4 puffs 5 to 15 minutes prior to strenuous physical activities. Monitor frequency of use - if you need to use it more than twice per week on a consistent basis let us know.  Get spirometry at next visit.   Chronic rhinitis Past history - Some mild symptoms in the spring and fall. Tried zyrtec with no benefit. Skin testing in 2006 was positive to mold per patient. No prior AIT. 2023 skin prick testing was  negative to indoor/outdoor allergens. 2023 environmental panel borderline positive to dog and cat.  Interim history - Improved with Flonase 1 puff nightly and daily antihistamine. Wears CPAP at night.  Continue environmental control measures.  Use Flonase (fluticasone) nasal spray 1 spray per nostril once a day as needed for nasal congestion.  May try to wean to every other day.  Nasal saline spray  (i.e., Simply Saline) or nasal saline lavage (i.e., NeilMed) is recommended as needed and prior to medicated nasal sprays. Use over the counter antihistamines such as Zyrtec (cetirizine), Claritin (loratadine), Allegra (fexofenadine), or Xyzal (levocetirizine) daily as needed. May take twice a day during allergy flares. May switch antihistamines every few months.   Gastroesophageal reflux disease, unspecified whether esophagitis present Controlled.  Continue lifestyle and dietary modifications. Continue omeprazole 40mg  once day - nothing to eat or drink for 20-30 minutes afterwards.  Follow up with GI.  Return in about 4 months (around 07/30/2023).  Meds ordered this encounter  Medications   DISCONTD: levalbuterol (XOPENEX HFA) 45 MCG/ACT inhaler    Sig: Inhale 2-4 puffs into the lungs every 4 (four) hours as needed for wheezing or shortness of breath (coughing fits).    Dispense:  1 each    Refill:  2   DISCONTD: levalbuterol (XOPENEX HFA) 45 MCG/ACT inhaler    Sig: Inhale 2-4 puffs into the lungs every 4 (four) hours as needed for wheezing or shortness of breath (coughing fits).    Dispense:  1 each    Refill:  2   levalbuterol (XOPENEX HFA) 45 MCG/ACT inhaler    Sig: Inhale 2-4 puffs into the lungs every 4 (four) hours as needed for wheezing or shortness of breath (coughing fits).    Dispense:  1 each    Refill:  2   Lab Orders  No laboratory test(s) ordered today    Diagnostics: Spirometry:  Tracings reviewed. His effort: Good reproducible efforts. FVC: 3.77L FEV1: 3.20L, 113% predicted FEV1/FVC ratio: 85% Interpretation: Spirometry consistent with normal pattern.  Please see scanned spirometry results for details.  Results discussed with patient/family.  Medication List:  Current Outpatient Medications  Medication Sig Dispense Refill   albuterol (VENTOLIN HFA) 108 (90 Base) MCG/ACT inhaler Inhale 2 puffs into the lungs every 4 (four) hours as needed for wheezing or  shortness of breath (coughing fits). 18 g 11   ASPIRIN 81 PO Take 1 tablet by mouth daily as needed.     atorvastatin (LIPITOR) 80 MG tablet Take 1 tablet (80 mg total) by mouth at bedtime. 90 tablet 1   b complex vitamins capsule Take 1 capsule by mouth daily.     fluticasone-salmeterol (ADVAIR) 100-50 MCG/ACT AEPB Inhale 1 puff into the lungs 2 (two) times daily.     Omeprazole 20 MG TBEC Take 40 mg by mouth.     Probiotic Product (PROBIOTIC ADVANCED PO) Take by mouth.     TH MAGNESIUM PO Take by mouth.     levalbuterol (XOPENEX HFA) 45 MCG/ACT inhaler Inhale 2-4 puffs into the lungs every 4 (four) hours as needed for wheezing or shortness of breath (coughing fits). 1 each 2   No current facility-administered medications for this visit.   Allergies: Allergies  Allergen Reactions   Other     Other Reaction(s): Eye Irritation    sinus problems   Gluten Meal Diarrhea and Other (See Comments)    GI   I reviewed his past medical history, social history, family history, and environmental history and no  significant changes have been reported from his previous visit.  Review of Systems  Constitutional:  Negative for appetite change, chills, fever and unexpected weight change.  HENT:  Negative for congestion, postnasal drip and rhinorrhea.   Eyes:  Negative for itching.  Respiratory:  Negative for cough, chest tightness, shortness of breath and wheezing.   Gastrointestinal:  Negative for abdominal pain.  Skin:  Negative for rash.  Neurological:  Negative for headaches.    Objective: BP 110/84   Pulse (!) 56   Temp 98.2 F (36.8 C)   Resp 16   Wt 153 lb (69.4 kg)   SpO2 96%   BMI 24.40 kg/m  Body mass index is 24.4 kg/m. Physical Exam Vitals and nursing note reviewed.  Constitutional:      Appearance: Normal appearance. He is well-developed.  HENT:     Head: Normocephalic and atraumatic.     Right Ear: Tympanic membrane and external ear normal.     Left Ear: Tympanic  membrane and external ear normal.     Nose: Nose normal.     Mouth/Throat:     Mouth: Mucous membranes are moist.     Pharynx: Oropharynx is clear.  Eyes:     Conjunctiva/sclera: Conjunctivae normal.  Cardiovascular:     Rate and Rhythm: Normal rate and regular rhythm.     Heart sounds: Normal heart sounds. No murmur heard. Pulmonary:     Effort: Pulmonary effort is normal.     Breath sounds: Normal breath sounds. No wheezing, rhonchi or rales.  Musculoskeletal:     Cervical back: Neck supple.  Skin:    General: Skin is warm.     Findings: No rash.  Neurological:     Mental Status: He is alert and oriented to person, place, and time.  Psychiatric:        Behavior: Behavior normal.   Previous notes and tests were reviewed. The plan was reviewed with the patient/family, and all questions/concerned were addressed.  It was my pleasure to see Eric [name] today and participate in his care. Please feel free to contact me with any questions or concerns.  Sincerely,  Wyline Mood, DO Allergy & Immunology  [facility] office: [phone] [facility] office: [phone]

## 2023-04-01 NOTE — Patient Instructions (Addendum)
 Breathing Daily controller medication(s): continue Advair 1 puff twice a day and rinse mouth after each use.  May decrease Advair to 1 puff once a day. If you notice worsening breathing issues then go back up to 1 puff twice a day.  May use levo/albuterol rescue inhaler 2 to 4 puffs every 4 to 6 hours as needed for shortness of breath, chest tightness, coughing, and wheezing. May use levo/albuterol rescue inhaler 2 to 4 puffs 5 to 15 minutes prior to strenuous physical activities. Monitor frequency of use - if you need to use it more than twice per week on a consistent basis let us know.  Breathing control goals:  Full participation in all desired activities (may need albuterol before activity) Albuterol use two times or less a week on average (not counting use with activity) Cough interfering with sleep two times or less a month Oral steroids no more than once a year No hospitalizations   Reflux Continue lifestyle and dietary modifications. Continue omeprazole 40mg  once day - nothing to eat or drink for 20-30 minutes afterwards.  Follow up with GI.  Rhinitis 2023 skin testing was negative to indoor/outdoor allergens.  2023 bloodwork was borderline to cat and dog. Continue environmental control measures.  Use Flonase (fluticasone) nasal spray 1 spray per nostril once a day as needed for nasal congestion.  May try to wean to every other day.  Nasal saline spray (i.e., Simply Saline) or nasal saline lavage (i.e., NeilMed) is recommended as needed and prior to medicated nasal sprays. Use over the counter antihistamines such as Zyrtec (cetirizine), Claritin (loratadine), Allegra (fexofenadine), or Xyzal (levocetirizine) daily as needed. May take twice a day during allergy flares. May switch antihistamines every few months.  Follow up in 4 months or sooner if needed.  Pet Allergen Avoidance: Contrary to popular opinion, there are no "hypoallergenic" breeds of dogs or cats. That is  because people are not allergic to an animal's hair, but to an allergen found in the animal's saliva, dander (dead skin flakes) or urine. Pet allergy symptoms typically occur within minutes. For some people, symptoms can build up and become most severe 8 to 12 hours after contact with the animal. People with severe allergies can experience reactions in public places if dander has been transported on the pet owners' clothing. Keeping an animal outdoors is only a partial solution, since homes with pets in the yard still have higher concentrations of animal allergens. Before getting a pet, ask your allergist to determine if you are allergic to animals. If your pet is already considered part of your family, try to minimize contact and keep the pet out of the bedroom and other rooms where you spend a great deal of time. As with dust mites, vacuum carpets often or replace carpet with a hardwood floor, tile or linoleum. High-efficiency particulate air (HEPA) cleaners can reduce allergen levels over time. While dander and saliva are the source of cat and dog allergens, urine is the source of allergens from rabbits, hamsters, mice and [place] pigs; so ask a non-allergic family member to clean the animal's cage. If you have a pet allergy, talk to your allergist about the potential for allergy immunotherapy (allergy shots). This strategy can often provide long-term relief.

## 2023-04-19 ENCOUNTER — Encounter: Payer: Self-pay | Admitting: Family Medicine

## 2023-04-26 ENCOUNTER — Encounter: Payer: Self-pay | Admitting: Family Medicine

## 2023-04-26 ENCOUNTER — Ambulatory Visit (INDEPENDENT_AMBULATORY_CARE_PROVIDER_SITE_OTHER): Payer: Medicare Other | Admitting: Family Medicine

## 2023-04-26 VITALS — BP 110/70 | HR 52 | Temp 97.3°F | Wt 153.4 lb

## 2023-04-26 DIAGNOSIS — R7303 Prediabetes: Secondary | ICD-10-CM

## 2023-04-26 DIAGNOSIS — I2510 Atherosclerotic heart disease of native coronary artery without angina pectoris: Secondary | ICD-10-CM

## 2023-04-26 DIAGNOSIS — I77810 Thoracic aortic ectasia: Secondary | ICD-10-CM

## 2023-04-26 DIAGNOSIS — J4540 Moderate persistent asthma, uncomplicated: Secondary | ICD-10-CM

## 2023-04-26 DIAGNOSIS — E785 Hyperlipidemia, unspecified: Secondary | ICD-10-CM

## 2023-04-26 DIAGNOSIS — K219 Gastro-esophageal reflux disease without esophagitis: Secondary | ICD-10-CM

## 2023-04-26 LAB — POCT GLYCOSYLATED HEMOGLOBIN (HGB A1C)
HbA1c POC (<> result, manual entry): 5.5 % (ref 4.0–5.6)
HbA1c, POC (controlled diabetic range): 5.5 % (ref 0.0–7.0)
HbA1c, POC (prediabetic range): 5.5 % — AB (ref 5.7–6.4)
Hemoglobin A1C: 5.5 % (ref 4.0–5.6)

## 2023-04-26 LAB — LIPID PANEL
Cholesterol: 118 mg/dL (ref 0–200)
HDL: 45.8 mg/dL (ref >39.00)
LDL Cholesterol: 60 mg/dL (ref 0–99)
NonHDL: 72.01
Total CHOL/HDL Ratio: 3
Triglycerides: 59 mg/dL (ref 0.0–149.0)
VLDL: 11.8 mg/dL (ref 0.0–40.0)

## 2023-04-26 MED ORDER — ATORVASTATIN CALCIUM 80 MG PO TABS: 80.0000 mg | ORAL_TABLET | Freq: Every day | ORAL | 1 refills | Status: DC

## 2023-04-26 MED ORDER — ALBUTEROL SULFATE HFA 108 (90 BASE) MCG/ACT IN AERS: 2.0000 | INHALATION_SPRAY | RESPIRATORY_TRACT | 11 refills | Status: DC | PRN

## 2023-04-26 NOTE — Patient Instructions (Addendum)
 Return in about 7 months (around 11/10/2023) for Med wellness/Routine chronic condition follow-up.        Great to see you today.  I have refilled the medication(s) we provide.   If labs were collected or images ordered, we will inform you of  results once we have received them and reviewed. We will contact you either by echart message, or telephone call.  Please give ample time to the testing facility, and our office to run,  receive and review results. Please do not call inquiring of results, even if you can see them in your chart. We will contact you as soon as we are able. If it has been over 1 week since the test was completed, and you have not yet heard from [place], then please call us.    - echart message- for normal results that have been seen by the patient already.   - telephone call: abnormal results or if patient has not viewed results in their echart.  If a referral to a specialist was entered for you, please call us in 2 weeks if you have not heard from the specialist office to schedule.

## 2023-04-26 NOTE — Progress Notes (Signed)
 Patient ID: Eric [name], male  DOB: 1955/07/19, 68 y.o.   MRN: [id number] Patient Care Team    Relationship Specialty Notifications Start End  Natalia Leatherwood, DO PCP - General Family Medicine  06/06/21   Caryn Section eye [facility] Consulting Physician Optometry  06/09/21   Ellamae Sia, DO Consulting Physician Allergy  11/09/22   Luciano Cutter, MD Consulting Physician Pulmonary Disease  11/09/22   [name], MD Consulting Physician Cardiology  11/09/22     Chief Complaint  Patient presents with   Hyperlipidemia    prediabetes    Subjective: Eric [name] is a 68 y.o.  male present for Chronic Conditions/illness Management  All past medical history, surgical history, allergies, family history, immunizations, medications and social history were updated in the electronic medical record today. All recent labs, ED visits and hospitalizations within the last year were reviewed.  He has a history of coronary artery disease, hyperlipidemia, family history of premature CAD.  He is compliant with atorvastatin 80 mg daily.  He has had and elevated calcium score in 06/2021-score 276/73rd percentile .  Since moving to the area he had noticed increased asthma, headaches and brain fog.  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  At one time was on chronic steroids, now managed with Symbicort and albuterol.  Diagnosis of moderate obstructive sleep apnea.      04/26/2023    8:25 AM 11/09/2022    8:08 AM 09/09/2022    3:42 PM 02/05/2022   11:59 AM 09/09/2021    8:13 AM  Depression screen PHQ 2/9  Decreased Interest 0 0 0 0 0  Down, Depressed, Hopeless 0 0 0 0 0  PHQ - 2 Score 0 0 0 0 0  Altered sleeping 1      Tired, decreased energy 1      Change in appetite 0      Feeling bad or failure about yourself  0      Trouble concentrating 0      Moving slowly or fidgety/restless 0      Suicidal thoughts 0      PHQ-9 Score  2      Difficult doing work/chores Not difficult at all          04/26/2023    8:26 AM  GAD 7 : Generalized Anxiety Score  Nervous, Anxious, on Edge 0  Control/stop worrying 0  Worry too much - different things 0  Trouble relaxing 0  Restless 0  Easily annoyed or irritable 1  Afraid - awful might happen 0  Total GAD 7 Score 1  Anxiety Difficulty Not difficult at all           04/26/2023    8:25 AM 11/09/2022    8:08 AM 11/05/2022    2:01 PM 09/29/2022   12:02 PM 09/24/2022    4:38 PM  Fall Risk   Falls in the past year? 0 0 0 0 0  Number falls in past yr:  0     Injury with Fall?  0     Risk for fall due to :  No Fall Risks     Follow up Falls evaluation completed Falls evaluation completed       Immunization History  Administered Date(s) Administered   Influenza Split 12/23/2011, 11/28/2018, 11/21/2019   Influenza,inj,Quad PF,6+ Mos 01/17/2014, 12/11/2014, 12/11/2015, 12/11/2016, 11/14/2020   Moderna Covid-19  Vaccine Bivalent Booster 1yrs & up 10/21/2020   Moderna Sars-Covid-2 Vaccination 05/23/2020, 11/04/2022   PFIZER(Purple Top)SARS-COV-2 Vaccination 04/09/2019, 05/02/2019, 11/21/2019, 05/23/2020   PNEUMOCOCCAL CONJUGATE-20 09/09/2021   Pfizer Covid-19 Vaccine Bivalent Booster 32yrs & up 10/21/2020   Pfizer(Comirnaty)Fall Seasonal Vaccine 12 years and older 03/02/2022   Td 05/15/2008   Zoster, Live 03/02/2022    No results found.  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   Coronary CT Angiograpm  July-August 2018   1) Calcium score 147 which is 74th percentile for age and sex 2) Right dominant coronary arteries with Less than 30% calcified plaque in proximal LAD. Less than 50% calcified plaque in proximal RCA, Less than 30% soft plauqe in mid RCA and Less than 30% calcified plaque in distal RCA   MOUTH SURGERY  1965   TOE SURGERY  2010   TONSILLECTOMY  1962   VASECTOMY     Family History  Problem Relation Age of Onset   Hypertension Mother    Breast cancer Mother    Hearing loss Mother    Hyperlipidemia Father    Hypertension Father    Dementia Father    Heart disease Father    Prostate cancer Father    Heart attack Father        Paternal uncle also had MI   Depression Father    Obesity Father    Alcohol abuse Sister    Allergic rhinitis Brother    Hypertension Brother    Alcohol abuse Brother    Coronary artery disease Paternal Uncle    Heart attack Paternal Uncle    Heart attack Paternal Uncle    Coronary artery disease Paternal Uncle    Heart disease Paternal Grandfather    Angioedema Other    Eczema Other    Colon cancer Neg Hx    Colon polyps Neg Hx    Pancreatic cancer Neg Hx    Rectal cancer Neg Hx    Stomach cancer Neg Hx    Social History   Social History Narrative   He is been married for 37 years.    Masters degree executive VP for [employer]   He is an avid exercise enthusiast - runs, bicycling and swimming at least 5 days a week for 40 minutes in time.   Has smoke alarm in the  home   Wears a seatbelt.   Feels  safe in his relationship.    Allergies as of 04/26/2023       Reactions   Other    Other Reaction(s): Eye Irritation    sinus problems   Gluten Meal Diarrhea, Other (See Comments)   GI        Medication List        Accurate as of April 26, 2023  8:56 AM. If you have any questions, ask your nurse or doctor.          albuterol 108 (90 Base) MCG/ACT inhaler Commonly known as: Ventolin HFA Inhale 2 puffs into the lungs every 4 (four) hours as needed for wheezing or shortness of breath (coughing fits).   ASPIRIN 81 PO Take 1 tablet by mouth daily as needed.   atorvastatin 80 MG tablet Commonly known as: LIPITOR Take 1 tablet (80 mg total) by mouth at bedtime.   b complex vitamins capsule Take 1 capsule by mouth daily.   fluticasone-salmeterol 100-50 MCG/ACT Aepb Commonly known as: ADVAIR Inhale 1 puff into the lungs 2 (two) times daily.   levalbuterol 45 MCG/ACT inhaler Commonly known as: Xopenex HFA Inhale 2-4 puffs into the lungs every 4 (four) hours as needed for wheezing or shortness of breath (coughing fits).   Omeprazole 20 MG Tbec Take 40 mg by mouth.   PROBIOTIC ADVANCED PO Take by mouth.   TH MAGNESIUM PO Take by mouth.        All past medical history, surgical history, allergies, family history, immunizations andmedications were updated in the EMR today and reviewed under the history and medication portions of their EMR.    Recent Results (from the past 2160 hours)  POCT HgB A1C     Status: Abnormal   Collection Time: 04/26/23  8:29 AM  Result Value Ref Range   Hemoglobin A1C 5.5 4.0 - 5.6 %   HbA1c POC (<> result, manual entry) 5.5 4.0 - 5.6 %   HbA1c, POC (prediabetic range) 5.5 (A) 5.7 - 6.4 %   HbA1c, POC (controlled diabetic range) 5.5 0.0 - 7.0 %      ROS 14 pt review of systems performed and negative (unless mentioned in an HPI)  Objective: BP 110/70   Pulse (!) 52   Temp (!) 97.3 F (36.3  C)   Wt 153 lb 6.4 oz (69.6 kg)   SpO2 99%   BMI 24.46 kg/m  Physical Exam Vitals and nursing note reviewed.  Constitutional:      General: He is not in acute distress.    Appearance: Normal appearance. He is not ill-appearing, toxic-appearing or diaphoretic.  HENT:     Head: Normocephalic and atraumatic.  Eyes:     General: No scleral icterus.       Right eye: No discharge.        Left eye: No discharge.     Extraocular Movements: Extraocular movements intact.     Pupils: Pupils are equal, round, and reactive to light.  Cardiovascular:     Rate and Rhythm: Normal rate and regular rhythm.  Pulmonary:     Effort: Pulmonary effort is normal. No respiratory distress.     Breath sounds: Normal breath sounds. No wheezing, rhonchi or rales.  Musculoskeletal:     Right lower leg: No edema.     Left lower leg: No edema.  Skin:    General: Skin is warm.     Findings: No rash.  Neurological:     Mental Status: He is alert and oriented  to person, place, and time. Mental status is at baseline.  Psychiatric:        Mood and Affect: Mood normal.        Behavior: Behavior normal.        Thought Content: Thought content normal.        Judgment: Judgment normal.       Assessment/plan: Eric [name] is a 68 y.o. male present for Chronic Conditions/illness Management Hyperlipidemia with target LDL <70/CAD/family history of premature CAD/elevated coronary calcium score  Continue atorvastatin 80 mg nightly Continue baby aspirin 07/2021: coronary calcium 06/2021: 73th percentile; 276 Labs UTD-due next visit after 11/09/2022 Pt desired lipid check today, he has bene at goal and understands this may not be covered by insurance.   Ascending aortic dilatation/abdominal aortic ectasia: 09/2021 ABD Aortic ectasia measuring up to 2.8 cm.>Recommend follow-up ultrasound every 5 years.-Due 09/2026 07/2021: Ascending Aorta: Mildly dilated (38.5 mm). (CT CHEST)  Elevated A1c: A1c collected today,  metformin discontinued last visit >5.5 Great! Asthma refilled albuterol for him Est with A&A GERD: OTC Prilosec B12, Vitamin D and mag every 2 years-UTD 2023 Continue B12 supplement, magnesium and vitamin D supplements  Medicare wellness after 11/10/2023  Return in about 7 months (around 11/10/2023) for Med wellness/Routine chronic condition follow-up.  Orders Placed This Encounter  Procedures   Lipid panel   POCT HgB A1C   Meds ordered this encounter  Medications   atorvastatin (LIPITOR) 80 MG tablet    Sig: Take 1 tablet (80 mg total) by mouth at bedtime.    Dispense:  90 tablet    Refill:  1   albuterol (VENTOLIN HFA) 108 (90 Base) MCG/ACT inhaler    Sig: Inhale 2 puffs into the lungs every 4 (four) hours as needed for wheezing or shortness of breath (coughing fits).    Dispense:  18 g    Refill:  11   Referral Orders  No referral(s) requested today      Note is dictated utilizing voice recognition software. Although note has been proof read prior to signing, occasional typographical errors still can be missed. If any questions arise, please do not hesitate to call for verification.  Electronically signed by: Felix Pacini, DO [facility] [place]

## 2023-05-10 ENCOUNTER — Encounter (HOSPITAL_COMMUNITY): Payer: Self-pay

## 2023-05-11 ENCOUNTER — Ambulatory Visit (HOSPITAL_COMMUNITY)
Admission: RE | Admit: 2023-05-11 | Discharge: 2023-05-11 | Disposition: A | Discharge status: Home / Self Care | Payer: Medicare Other | Source: Ambulatory Visit | Attending: Cardiology | Admitting: Cardiology

## 2023-05-11 DIAGNOSIS — I2510 Atherosclerotic heart disease of native coronary artery without angina pectoris: Secondary | ICD-10-CM | POA: Insufficient documentation

## 2023-05-11 LAB — NM PET CT CARDIAC PERFUSION MULTI W/ABSOLUTE BLOODFLOW
LV dias vol: 90 mL (ref 62–150)
MBFR: 3.1
Nuc Rest EF: 52 %
Nuc Stress EF: 59 %
Peak HR: 83 {beats}/min
Rest HR: 52 {beats}/min
Rest MBF: 0.68 ml/g/min
Rest Nuclear Isotope Dose: 18.1 mCi
Rest perfusion cavity size (mL): 90 mL
ST Depression (mm): 0 mm
Stress MBF: 2.11 ml/g/min
Stress Nuclear Isotope Dose: 17.8 mCi
Stress perfusion cavity size (mL): 111 mL
TID: 1.13

## 2023-05-11 MED ORDER — REGADENOSON 0.4 MG/5ML IV SOLN
0.4000 mg | Freq: Once | INTRAVENOUS | Status: AC
  Administered 2023-05-11: 0.4 mg via INTRAVENOUS

## 2023-05-11 MED ORDER — RUBIDIUM RB82 GENERATOR (RUBYFILL)
18.1300 | PACK | Freq: Once | INTRAVENOUS | Status: AC
  Administered 2023-05-11: 18.13 via INTRAVENOUS

## 2023-05-11 MED ORDER — REGADENOSON 0.4 MG/5ML IV SOLN
INTRAVENOUS | Status: AC
  Filled 2023-05-11: qty 5

## 2023-05-11 MED ORDER — RUBIDIUM RB82 GENERATOR (RUBYFILL)
17.8700 | PACK | Freq: Once | INTRAVENOUS | Status: AC
  Administered 2023-05-11: 17.87 via INTRAVENOUS

## 2023-05-11 NOTE — Progress Notes (Signed)
 Pt. Tolerated lexi scan well.

## 2023-05-18 ENCOUNTER — Other Ambulatory Visit: Payer: Self-pay | Admitting: Allergy

## 2023-05-18 ENCOUNTER — Encounter: Payer: Self-pay | Admitting: Allergy

## 2023-05-18 ENCOUNTER — Other Ambulatory Visit: Payer: Self-pay

## 2023-05-18 MED ORDER — FLUTICASONE-SALMETEROL 100-50 MCG/ACT IN AEPB: INHALATION_SPRAY | RESPIRATORY_TRACT | 1 refills | Status: DC

## 2023-05-18 NOTE — Telephone Encounter (Signed)
 Please call patient and let them know that Advair Discus is not covered and I had to send in Advair HFA 45mcg 2 puffs to use.

## 2023-05-18 NOTE — Telephone Encounter (Signed)
 Would you like to send in an alternative?

## 2023-05-19 ENCOUNTER — Other Ambulatory Visit: Payer: Self-pay

## 2023-05-19 ENCOUNTER — Telehealth: Payer: Self-pay

## 2023-05-19 ENCOUNTER — Other Ambulatory Visit: Payer: Self-pay | Admitting: Allergy

## 2023-05-19 ENCOUNTER — Encounter: Payer: Self-pay | Admitting: Allergy

## 2023-05-19 MED ORDER — FLUTICASONE-SALMETEROL 100-50 MCG/ACT IN AEPB: INHALATION_SPRAY | RESPIRATORY_TRACT | 1 refills | Status: DC

## 2023-05-19 NOTE — Telephone Encounter (Signed)
 I called the patient and he plans to pick it up later today. He will call back if the cost is an issues for him.

## 2023-05-19 NOTE — Telephone Encounter (Signed)
 Pharmacy called and stated we had received a denial for Advair Diskus from them causing us  to send over the puffer instead. Patient is wanting to use GoodRx for the Diskus because this will be cheaper. The puffer script canceled the diskus so a new script is needed. Will send new script now for patient, patient requests [address].

## 2023-06-09 ENCOUNTER — Ambulatory Visit (HOSPITAL_BASED_OUTPATIENT_CLINIC_OR_DEPARTMENT_OTHER): Admitting: Pulmonary Disease

## 2023-06-09 DIAGNOSIS — J4530 Mild persistent asthma, uncomplicated: Secondary | ICD-10-CM

## 2023-06-09 LAB — PULMONARY FUNCTION TEST
DL/VA % pred: 90 %
DL/VA: 3.81 ml/min/mmHg/L
DLCO unc % pred: 99 %
DLCO unc: 22.5 ml/min/mmHg
FEF 25-75 Post: 4.15 L/s
FEF 25-75 Pre: 3.66 L/s
FEF2575-%Change-Post: 13 %
FEF2575-%Pred-Post: 190 %
FEF2575-%Pred-Pre: 168 %
FEV1-%Change-Post: 1 %
FEV1-%Pred-Post: 128 %
FEV1-%Pred-Pre: 126 %
FEV1-Post: 3.54 L
FEV1-Pre: 3.48 L
FEV1FVC-%Change-Post: 2 %
FEV1FVC-%Pred-Pre: 110 %
FEV6-%Change-Post: 0 %
FEV6-%Pred-Post: 120 %
FEV6-%Pred-Pre: 121 %
FEV6-Post: 4.22 L
FEV6-Pre: 4.24 L
FEV6FVC-%Pred-Post: 106 %
FEV6FVC-%Pred-Pre: 106 %
FVC-%Change-Post: 0 %
FVC-%Pred-Post: 113 %
FVC-%Pred-Pre: 113 %
FVC-Post: 4.22 L
FVC-Pre: 4.24 L
Post FEV1/FVC ratio: 84 %
Post FEV6/FVC ratio: 100 %
Pre FEV1/FVC ratio: 82 %
Pre FEV6/FVC Ratio: 100 %
RV % pred: 90 %
RV: 1.9 L
TLC % pred: 102 %
TLC: 6.19 L

## 2023-06-09 NOTE — Progress Notes (Signed)
 Full pft performed today.

## 2023-06-09 NOTE — Patient Instructions (Signed)
 Full pft performed today.

## 2023-06-14 ENCOUNTER — Encounter (HOSPITAL_BASED_OUTPATIENT_CLINIC_OR_DEPARTMENT_OTHER): Payer: Self-pay

## 2023-06-21 ENCOUNTER — Encounter: Payer: Self-pay | Admitting: Allergy

## 2023-06-21 ENCOUNTER — Other Ambulatory Visit: Payer: Self-pay | Admitting: Allergy

## 2023-06-21 MED ORDER — FLUTICASONE-SALMETEROL 100-50 MCG/ACT IN AEPB: INHALATION_SPRAY | RESPIRATORY_TRACT | 1 refills | Status: DC

## 2023-07-05 ENCOUNTER — Other Ambulatory Visit

## 2023-07-05 ENCOUNTER — Encounter: Payer: Self-pay | Admitting: Cardiology

## 2023-07-07 ENCOUNTER — Encounter: Payer: Self-pay | Admitting: Cardiology

## 2023-07-19 ENCOUNTER — Encounter: Payer: Self-pay | Admitting: Gastroenterology

## 2023-07-28 NOTE — Progress Notes (Unsigned)
 Follow Up Note  RE: Eric [name] MRN: [id number] DOB: [birth year] Date of Office Visit: 07/29/2023  Referring provider: Catherine Charlies LABOR, DO Primary care provider: Catherine Charlies LABOR, DO  Chief Complaint: No chief complaint on file.  History of Present Illness: I had the pleasure of seeing Eric [name] for a follow up visit at the [facility] on 07/29/2023. He is a 68 y.o. male, who is being followed for asthma, chronic rhinitis and GERD. His previous allergy  office visit was on 04/01/2023 with Dr. Luke. Today is a regular follow up visit.  Discussed the use of AI scribe software for clinical note transcription with the patient, who gave verbal consent to proceed.  History of Present Illness            ***  Assessment and Plan: Eric [name] is a 68 y.o. male with: Moderate persistent asthma Past history - Noted shortness of breath with swimming and first mile running. Sometimes has coughing and wheezing at the end. Runs 6 miles consistently. Denies any prior asthma diagnosis or inhaler use. 2023 spirometry was normal. Interim history - Improved with Advair 100mcg 1 puff BID. Occasional difficulty with breathing during runs despite pre-run Albuterol  use. Today's spirometry was normal. Daily controller medication(s): continue Advair 100mcg 1 puff twice a day and rinse mouth after each use.  May decrease Advair to 1 puff once a day. If you notice worsening breathing issues then go back up to 1 puff twice a day.  May use levo/albuterol  rescue inhaler 2 to 4 puffs every 4 to 6 hours as needed for shortness of breath, chest tightness, coughing, and wheezing. May use levo/albuterol  rescue inhaler 2 to 4 puffs 5 to 15 minutes prior to strenuous physical activities. Monitor frequency of use - if you need to use it more than twice per week on a consistent basis let us  know.  Get spirometry at next visit.   Chronic rhinitis Past history - Some mild symptoms in the spring and fall.  Tried zyrtec  with no benefit. Skin testing in 2006 was positive to mold per patient. No prior AIT. 2023 skin prick testing was negative to indoor/outdoor allergens. 2023 environmental panel borderline positive to dog and cat.  Interim history - Improved with Flonase  1 puff nightly and daily antihistamine. Wears CPAP at night.  Continue environmental control measures.  Use Flonase  (fluticasone ) nasal spray 1 spray per nostril once a day as needed for nasal congestion.  May try to wean to every other day.  Nasal saline spray (i.e., Simply Saline) or nasal saline lavage (i.e., NeilMed) is recommended as needed and prior to medicated nasal sprays. Use over the counter antihistamines such as Zyrtec  (cetirizine ), Claritin (loratadine), Allegra (fexofenadine), or Xyzal (levocetirizine) daily as needed. May take twice a day during allergy  flares. May switch antihistamines every few months.   Gastroesophageal reflux disease, unspecified whether esophagitis present Controlled.  Continue lifestyle and dietary modifications. Continue omeprazole 40mg  once day - nothing to eat or drink for 20-30 minutes afterwards.  Follow up with GI.   Assessment and Plan              No follow-ups on file.  No orders of the defined types were placed in this encounter.  Lab Orders  No laboratory test(s) ordered today    Diagnostics: Spirometry:  Tracings reviewed. His effort: {Blank single:19197::Good reproducible efforts.,It was hard to get consistent efforts and there is a question as to whether this reflects a maximal maneuver.,Poor  effort, data can not be interpreted.} FVC: ***L FEV1: ***L, ***% predicted FEV1/FVC ratio: ***% Interpretation: {Blank single:19197::Spirometry consistent with mild obstructive disease,Spirometry consistent with moderate obstructive disease,Spirometry consistent with severe obstructive disease,Spirometry consistent with possible restrictive disease,Spirometry  consistent with mixed obstructive and restrictive disease,Spirometry uninterpretable due to technique,Spirometry consistent with normal pattern,No overt abnormalities noted given today's efforts}.  Please see scanned spirometry results for details.  Skin Testing: {Blank single:19197::Select foods,Environmental allergy  panel,Environmental allergy  panel and select foods,Food allergy  panel,None,Deferred due to recent antihistamines use}. *** Results discussed with patient/family.   Medication List:  Current Outpatient Medications  Medication Sig Dispense Refill  . albuterol  (VENTOLIN  HFA) 108 (90 Base) MCG/ACT inhaler Inhale 2 puffs into the lungs every 4 (four) hours as needed for wheezing or shortness of breath (coughing fits). 18 g 11  . ASPIRIN 81 PO Take 1 tablet by mouth daily as needed.    . atorvastatin  (LIPITOR) 80 MG tablet Take 1 tablet (80 mg total) by mouth at bedtime. 90 tablet 1  . b complex vitamins capsule Take 1 capsule by mouth daily.    . fluticasone -salmeterol (ADVAIR DISKUS) 100-50 MCG/ACT AEPB 1 puff twice a day and rinse mouth after each use. May decrease Advair to 1 puff once a day. If you notice worsening breathing issues then go back up to 1 puff twice a day. 60 each 1  . levalbuterol  (XOPENEX  HFA) 45 MCG/ACT inhaler Inhale 2-4 puffs into the lungs every 4 (four) hours as needed for wheezing or shortness of breath (coughing fits). 1 each 2  . Omeprazole 20 MG TBEC Take 40 mg by mouth.    . Probiotic Product (PROBIOTIC ADVANCED PO) Take by mouth.    . TH MAGNESIUM PO Take by mouth.     No current facility-administered medications for this visit.   Allergies: Allergies  Allergen Reactions  . Other     Other Reaction(s): Eye Irritation    sinus problems  . Gluten Meal Diarrhea and Other (See Comments)    GI   I reviewed his past medical history, social history, family history, and environmental history and no significant changes have been  reported from his previous visit.  Review of Systems  Constitutional:  Negative for appetite change, chills, fever and unexpected weight change.  HENT:  Negative for congestion, postnasal drip and rhinorrhea.   Eyes:  Negative for itching.  Respiratory:  Negative for cough, chest tightness, shortness of breath and wheezing.   Gastrointestinal:  Negative for abdominal pain.  Skin:  Negative for rash.  Neurological:  Negative for headaches.   Objective: There were no vitals taken for this visit. There is no height or weight on file to calculate BMI. Physical Exam Vitals and nursing note reviewed.  Constitutional:      Appearance: Normal appearance. He is well-developed.  HENT:     Head: Normocephalic and atraumatic.     Right Ear: Tympanic membrane and external ear normal.     Left Ear: Tympanic membrane and external ear normal.     Nose: Nose normal.     Mouth/Throat:     Mouth: Mucous membranes are moist.     Pharynx: Oropharynx is clear.   Eyes:     Conjunctiva/sclera: Conjunctivae normal.    Cardiovascular:     Rate and Rhythm: Normal rate and regular rhythm.     Heart sounds: Normal heart sounds. No murmur heard. Pulmonary:     Effort: Pulmonary effort is normal.     Breath sounds: Normal  breath sounds. No wheezing, rhonchi or rales.   Musculoskeletal:     Cervical back: Neck supple.   Skin:    General: Skin is warm.     Findings: No rash.   Neurological:     Mental Status: He is alert and oriented to person, place, and time.   Psychiatric:        Behavior: Behavior normal.  Previous notes and tests were reviewed. The plan was reviewed with the patient/family, and all questions/concerned were addressed.  It was my pleasure to see Eric [name] today and participate in his care. Please feel free to contact me with any questions or concerns.  Sincerely,  Orlan Cramp, DO Allergy  & Immunology  [facility]  [place] office:  [phone] [facility] office: [phone]

## 2023-07-29 ENCOUNTER — Encounter: Payer: Self-pay | Admitting: Allergy

## 2023-07-29 ENCOUNTER — Ambulatory Visit: Payer: Medicare Other | Admitting: Allergy

## 2023-07-29 VITALS — BP 126/78 | HR 55 | Temp 98.0°F | Resp 18 | Wt 151.4 lb

## 2023-07-29 DIAGNOSIS — J4540 Moderate persistent asthma, uncomplicated: Secondary | ICD-10-CM | POA: Diagnosis not present

## 2023-07-29 DIAGNOSIS — J310 Chronic rhinitis: Secondary | ICD-10-CM

## 2023-07-29 DIAGNOSIS — K219 Gastro-esophageal reflux disease without esophagitis: Secondary | ICD-10-CM | POA: Diagnosis not present

## 2023-07-29 MED ORDER — ALBUTEROL SULFATE HFA 108 (90 BASE) MCG/ACT IN AERS: 2.0000 | INHALATION_SPRAY | RESPIRATORY_TRACT | 1 refills | Status: AC | PRN

## 2023-07-29 NOTE — Patient Instructions (Addendum)
 Breathing Your breathing test was normal today.  Daily controller medication(s): May take Advair 1 puff once a day on Monday/Wednesday/Friday. If you notice worsening breathing issues then go back up to 1 puff once a day.  May use albuterol  rescue inhaler 2 to 4 puffs every 4 to 6 hours as needed for shortness of breath, chest tightness, coughing, and wheezing. May use albuterol  rescue inhaler 2 to 4 puffs 5 to 15 minutes prior to strenuous physical activities. Try NOT to use it everytime before exercise. Monitor frequency of use - if you need to use it more than twice per week on a consistent basis let us  know.  Breathing control goals:  Full participation in all desired activities (may need albuterol  before activity) Albuterol  use two times or less a week on average (not counting use with activity) Cough interfering with sleep two times or less a month Oral steroids no more than once a year No hospitalizations   Reflux Continue lifestyle and dietary modifications. Continue omeprazole 40mg  once day - nothing to eat or drink for 20-30 minutes afterwards.  Follow up with GI.  Rhinitis 2023 skin testing negative to indoor/outdoor allergens.  2023 bloodwork was borderline to cat and dog. Continue environmental control measures.  Use Flonase  (fluticasone ) nasal spray 1 spray per nostril once a day as needed for nasal congestion.  Nasal saline spray (i.e., Simply Saline) or nasal saline lavage (i.e., NeilMed) is recommended as needed and prior to medicated nasal sprays. Use over the counter antihistamines such as Zyrtec  (cetirizine ), Claritin (loratadine), Allegra (fexofenadine), or Xyzal (levocetirizine) daily as needed. May take twice a day during allergy  flares. May switch antihistamines every few months.  Follow up in 12 months or sooner if needed.  Pet Allergen Avoidance: Contrary to popular opinion, there are no "hypoallergenic" breeds of dogs or cats. That is because people are not  allergic to an animal's hair, but to an allergen found in the animal's saliva, dander (dead skin flakes) or urine. Pet allergy  symptoms typically occur within minutes. For some people, symptoms can build up and become most severe 8 to 12 hours after contact with the animal. People with severe allergies can experience reactions in public places if dander has been transported on the pet owners' clothing. Keeping an animal outdoors is only a partial solution, since homes with pets in the yard still have higher concentrations of animal allergens. Before getting a pet, ask your allergist to determine if you are allergic to animals. If your pet is already considered part of your family, try to minimize contact and keep the pet out of the bedroom and other rooms where you spend a great deal of time. As with dust mites, vacuum carpets often or replace carpet with a hardwood floor, tile or linoleum. High-efficiency particulate air (HEPA) cleaners can reduce allergen levels over time. While dander and saliva are the source of cat and dog allergens, urine is the source of allergens from rabbits, hamsters, mice and israel pigs; so ask a non-allergic family member to clean the animal's cage. If you have a pet allergy , talk to your allergist about the potential for allergy  immunotherapy (allergy  shots). This strategy can often provide long-term relief.

## 2023-09-05 NOTE — Progress Notes (Unsigned)
 Eric [name] [id number] 10/21/1955   Chief Complaint:  Referring Provider: Catherine Charlies LABOR, DO Primary GI MD: Sampson (previous Dr. Teressa)  HPI: Eric [name] is a 68 y.o. male with past medical history of asthma, chronic rhinitis, nonocclusive CAD, eczema, GERD, gluten intolerance, HLD, OSA, CKD who presents today for a complaint of GERD and to discuss EGD.    07/29/2023 patient seen by allergy  and asthma, noted to have GERD symptoms if he misses his medication.  On omeprazole 40 mg once daily.  Advised to follow-up with GI.  No prior EGD.  Most recent labs 11/09/2022 with normal CBC, CMP, TSH, hemoglobin A1c    Previous GI Procedures/Imaging   Colonoscopy 12/30/2015 - Internal hemorrhoids.  - The examination was otherwise normal on direct and retroflexion views.  - No polyps or cancers. - Recall 10 years   Past Medical History:  Diagnosis Date   Allergies    Allergy  1976   Asthma    Bronchitis due to COVID-19 virus 12/09/2021   Chicken pox    Chronic rhinitis 07/22/2021   Coronary artery disease, non-occlusive    Elevated Coronary Calcium  Score - Non-obstructive CAD (30-50%) on Coronary CTA   Dietary counseling and surveillance 07/22/2021   Dislocation of shoulder, left, closed    Eczema    Ganglion cyst 09/17/2010   GERD (gastroesophageal reflux disease)    Gluten intolerance    Leads to diffuse GI issues   Heart disease    Hyperlipidemia with target LDL less than 70 07/23/2016   Kidney disease    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   Coronary CT Angiograpm  July-August 2018   1) Calcium  score 147 which is 74th percentile for age and sex 2) Right dominant coronary arteries with Less than 30% calcified plaque in proximal LAD. Less than 50% calcified plaque in proximal RCA, Less  than 30% soft plauqe in mid RCA and Less than 30% calcified plaque in distal RCA   MOUTH SURGERY  1965   TOE SURGERY  2010   TONSILLECTOMY  1962   VASECTOMY      Current Outpatient Medications  Medication Sig Dispense Refill   albuterol  (VENTOLIN  HFA) 108 (90 Base) MCG/ACT inhaler Inhale 2 puffs into the lungs every 4 (four) hours as needed for wheezing or shortness of breath (coughing fits). 18 g 1   ASPIRIN 81 PO Take 1 tablet by mouth daily as needed.     atorvastatin  (LIPITOR) 80 MG tablet Take 1 tablet (80 mg total) by mouth at bedtime. 90 tablet 1   b complex vitamins capsule Take 1 capsule by mouth daily.     fluticasone -salmeterol (ADVAIR DISKUS) 100-50 MCG/ACT AEPB 1 puff twice a day and rinse mouth after each use. May decrease Advair to 1 puff once a day. If you notice worsening breathing issues then go back up to 1 puff twice a day. 60 each 1   Omeprazole 20 MG TBEC Take 40 mg by mouth.     Probiotic Product (PROBIOTIC ADVANCED PO) Take by mouth.     TH MAGNESIUM PO Take by mouth.     No current facility-administered medications for this visit.    Allergies as of 09/06/2023 - Review Complete 07/29/2023  Allergen Reaction Noted   Other  12/12/2015  Gluten meal Diarrhea and Other (See Comments) 11/27/2015    Family History  Problem Relation Age of Onset   Hypertension Mother    Breast cancer Mother    Hearing loss Mother    Hyperlipidemia Father    Hypertension Father    Dementia Father    Heart disease Father    Prostate cancer Father    Heart attack Father        Paternal uncle also had MI   Depression Father    Obesity Father    Alcohol abuse Sister    Allergic rhinitis Brother    Hypertension Brother    Alcohol abuse Brother    Coronary artery disease Paternal Uncle    Heart attack Paternal Uncle    Heart attack Paternal Uncle    Coronary artery disease Paternal Uncle    Heart disease Paternal Grandfather    Angioedema Other    Eczema Other    Colon  cancer Neg Hx    Colon polyps Neg Hx    Pancreatic cancer Neg Hx    Rectal cancer Neg Hx    Stomach cancer Neg Hx     Social History   Tobacco Use   Smoking status: Former    Current packs/day: 0.00    Average packs/day: 1 pack/day for 2.0 years (2.0 ttl pk-yrs)    Types: Cigarettes    Start date: 07/03/1969    Quit date: 07/04/1971    Years since quitting: 52.2    Passive exposure: Never   Smokeless tobacco: Never  Vaping Use   Vaping status: Never Used  Substance Use Topics   Alcohol use: Yes    Alcohol/week: 7.0 standard drinks of alcohol    Types: 7 Glasses of wine per week   Drug use: No     Review of Systems:    Constitutional: No weight loss, fever, chills, weakness or fatigue Eyes: No change in vision Ears, Nose, Throat:  No change in hearing or congestion Skin: No rash or itching Cardiovascular: No chest pain, chest pressure or palpitations   Respiratory: No SOB or cough Gastrointestinal: See HPI and otherwise negative Genitourinary: No dysuria or change in urinary frequency Neurological: No headache, dizziness or syncope Musculoskeletal: No new muscle or joint pain Hematologic: No bleeding or bruising    Physical Exam:  Vital signs: There were no vitals taken for this visit.  Constitutional: NAD, Well developed, Well nourished, alert and cooperative Head:  Normocephalic and atraumatic.  Eyes: No scleral icterus. Conjunctiva pink. Mouth: No oral lesions. Respiratory: Respirations even and unlabored. Lungs clear to auscultation bilaterally.  No wheezes, crackles, or rhonchi.  Cardiovascular:  Regular rate and rhythm. No murmurs. No peripheral edema. Gastrointestinal:  Soft, nondistended, nontender. No rebound or guarding. Normal bowel sounds. No appreciable masses or hepatomegaly. Rectal:  Not performed.  Neurologic:  Alert and oriented x4;  grossly normal neurologically.  Skin:   Dry and intact without significant lesions or rashes. Psychiatric: Oriented  to person, place and time. Demonstrates good judgement and reason without abnormal affect or behaviors.   RELEVANT LABS AND IMAGING: CBC    Component Value Date/Time   WBC 5.1 11/09/2022 0820   RBC 5.14 11/09/2022 0820   HGB 14.7 11/09/2022 0820   HGB 14.8 03/23/2022 0955   HCT 45.6 11/09/2022 0820   HCT 45.5 03/23/2022 0955   PLT 270.0 11/09/2022 0820   PLT 397 03/23/2022 0955   MCV 88.8 11/09/2022 0820   MCV 88 03/23/2022 0955   MCH  28.7 03/23/2022 0955   MCH 29.3 01/13/2022 1058   MCHC 32.3 11/09/2022 0820   RDW 14.5 11/09/2022 0820   RDW 13.2 03/23/2022 0955   LYMPHSABS 1.7 11/09/2022 0820   LYMPHSABS 2.0 03/23/2022 0955   MONOABS 0.5 11/09/2022 0820   EOSABS 0.3 11/09/2022 0820   EOSABS 0.7 (H) 03/23/2022 0955   BASOSABS 0.1 11/09/2022 0820   BASOSABS 0.1 03/23/2022 0955    CMP     Component Value Date/Time   NA 138 11/09/2022 0820   NA 139 08/19/2020 0000   K 4.8 11/09/2022 0820   CL 105 11/09/2022 0820   CO2 26 11/09/2022 0820   GLUCOSE 111 (H) 11/09/2022 0820   BUN 29 (H) 11/09/2022 0820   BUN 28 (A) 08/19/2020 0000   CREATININE 1.09 11/09/2022 0820   CREATININE 1.31 06/06/2021 1436   CALCIUM  9.5 11/09/2022 0820   PROT 6.4 11/09/2022 0820   ALBUMIN 4.2 11/09/2022 0820   AST 32 11/09/2022 0820   ALT 49 11/09/2022 0820   ALKPHOS 45 11/09/2022 0820   BILITOT 0.5 11/09/2022 0820   GFRNONAA 70 08/28/2016 1107   GFRAA 81 08/28/2016 1107     Assessment/Plan:       Camie Furbish, PA-C [facility] 09/05/2023, 7:22 PM  Patient Care Team: Catherine Charlies LABOR, DO as PCP - General (Family Medicine) [facility] as Consulting Physician (Optometry) Luke Orlan HERO, DO as Consulting Physician (Allergy ) Kassie Acquanetta Bradley, MD as Consulting Physician (Pulmonary Disease) Anner Alm ORN, MD as Consulting Physician (Cardiology)

## 2023-09-06 ENCOUNTER — Ambulatory Visit (INDEPENDENT_AMBULATORY_CARE_PROVIDER_SITE_OTHER): Admitting: Gastroenterology

## 2023-09-06 ENCOUNTER — Encounter: Payer: Self-pay | Admitting: Gastroenterology

## 2023-09-06 ENCOUNTER — Other Ambulatory Visit (INDEPENDENT_AMBULATORY_CARE_PROVIDER_SITE_OTHER)

## 2023-09-06 VITALS — BP 128/80 | HR 67 | Ht 65.0 in | Wt 155.1 lb

## 2023-09-06 DIAGNOSIS — R195 Other fecal abnormalities: Secondary | ICD-10-CM | POA: Diagnosis not present

## 2023-09-06 DIAGNOSIS — R140 Abdominal distension (gaseous): Secondary | ICD-10-CM | POA: Diagnosis not present

## 2023-09-06 DIAGNOSIS — K219 Gastro-esophageal reflux disease without esophagitis: Secondary | ICD-10-CM

## 2023-09-06 DIAGNOSIS — R21 Rash and other nonspecific skin eruption: Secondary | ICD-10-CM

## 2023-09-06 LAB — CBC WITH DIFFERENTIAL/PLATELET
Basophils Absolute: 0 K/uL (ref 0.0–0.1)
Basophils Relative: 1.3 % (ref 0.0–3.0)
Eosinophils Absolute: 0.3 K/uL (ref 0.0–0.7)
Eosinophils Relative: 6.5 % — ABNORMAL HIGH (ref 0.0–5.0)
HCT: 42.6 % (ref 39.0–52.0)
Hemoglobin: 14.1 g/dL (ref 13.0–17.0)
Lymphocytes Relative: 30.5 % (ref 12.0–46.0)
Lymphs Abs: 1.2 K/uL (ref 0.7–4.0)
MCHC: 33.1 g/dL (ref 30.0–36.0)
MCV: 88.2 fl (ref 78.0–100.0)
Monocytes Absolute: 0.5 K/uL (ref 0.1–1.0)
Monocytes Relative: 13.7 % — ABNORMAL HIGH (ref 3.0–12.0)
Neutro Abs: 1.9 K/uL (ref 1.4–7.7)
Neutrophils Relative %: 48 % (ref 43.0–77.0)
Platelets: 224 K/uL (ref 150.0–400.0)
RBC: 4.83 Mil/uL (ref 4.22–5.81)
RDW: 14.7 % (ref 11.5–15.5)
WBC: 3.9 K/uL — ABNORMAL LOW (ref 4.0–10.5)

## 2023-09-06 LAB — COMPREHENSIVE METABOLIC PANEL WITH GFR
ALT: 37 U/L (ref 0–53)
AST: 28 U/L (ref 0–37)
Albumin: 4.1 g/dL (ref 3.5–5.2)
Alkaline Phosphatase: 44 U/L (ref 39–117)
BUN: 25 mg/dL — ABNORMAL HIGH (ref 6–23)
CO2: 29 meq/L (ref 19–32)
Calcium: 9 mg/dL (ref 8.4–10.5)
Chloride: 105 meq/L (ref 96–112)
Creatinine, Ser: 1.03 mg/dL (ref 0.40–1.50)
GFR: 75.21 mL/min (ref >60.00)
Glucose, Bld: 109 mg/dL — ABNORMAL HIGH (ref 70–99)
Potassium: 4.4 meq/L (ref 3.5–5.1)
Sodium: 140 meq/L (ref 135–145)
Total Bilirubin: 0.5 mg/dL (ref 0.2–1.2)
Total Protein: 6.3 g/dL (ref 6.0–8.3)

## 2023-09-06 LAB — TSH: TSH: 1.53 u[IU]/mL (ref 0.35–5.50)

## 2023-09-06 NOTE — Patient Instructions (Addendum)
 Your provider has requested that you go to the basement level for lab work before leaving today. Press B on the elevator. The lab is located at the first door on the left as you exit the elevator.  Continue omeprazole daily.   You have been given a testing kit to check for small intestine bacterial overgrowth (SIBO) which is completed by a company named [facility]. Make sure to return your test in the mail using the return mailing label given to you along with the kit. The test order, your demographic and insurance information have all already been sent to the company. [facility] will collect an upfront charge of $109.00 for commercial insurance plans and $229.00 if you are paying cash. The potential remaining total after claim submission and review is $120.00. Make sure to discuss with [facility] PRIOR to having the test to see if they have gotten information from your insurance company as to how much your testing will cost out of pocket, if any. Please contact [facility] at phone number [phone] to get instructions regarding how to perform the test as our office is unable to give specific testing instructions.   You have been scheduled for an endoscopy. Please follow written instructions given to you at your visit today.  If you use inhalers (even only as needed), please bring them with you on the day of your procedure.  If you take any of the following medications, they will need to be adjusted prior to your procedure:   DO NOT TAKE 7 DAYS PRIOR TO TEST- Trulicity (dulaglutide) Ozempic, Wegovy (semaglutide) Mounjaro (tirzepatide) Bydureon Bcise (exanatide extended release)  DO NOT TAKE 1 DAY PRIOR TO YOUR TEST Rybelsus (semaglutide) Adlyxin (lixisenatide) Victoza (liraglutide) Byetta (exanatide) ___________________________________________________________________________   If your blood pressure at your visit was 140/90 or greater, please contact your primary  care physician to follow up on this.  _______________________________________________________  If you are age 51 or older, your body mass index should be between 23-30. Your Body mass index is 25.81 kg/m. If this is out of the aforementioned range listed, please consider follow up with your Primary Care Provider.  If you are age 33 or younger, your body mass index should be between 19-25. Your Body mass index is 25.81 kg/m. If this is out of the aformentioned range listed, please consider follow up with your Primary Care Provider.   ________________________________________________________  The [facility] providers would like to encourage you to use MYCHART to communicate with providers for non-urgent requests or questions.  Due to long hold times on the telephone, sending your provider a message by [facility] may be a faster and more efficient way to get a response.  Please allow 48 business hours for a response.  Please remember that this is for non-urgent requests.  _______________________________________________________  [facility] is using a team-based approach to care.  Your team is made up of your doctor and two to three APPS. Our APPS (Nurse Practitioners and Physician Assistants) work with your physician to ensure care continuity for you. They are fully qualified to address your health concerns and develop a treatment plan. They communicate directly with your gastroenterologist to care for you. Seeing the Advanced Practice Practitioners on your physician's team can help you by facilitating care more promptly, often allowing for earlier appointments, access to diagnostic testing, procedures, and other specialty referrals.

## 2023-09-07 LAB — IGA: Immunoglobulin A: 301 mg/dL (ref 70–320)

## 2023-09-07 LAB — TISSUE TRANSGLUTAMINASE, IGA: (tTG) Ab, IgA: <1 U/mL

## 2023-09-07 NOTE — Progress Notes (Signed)
 ____________________________________________________________  Attending physician addendum:  Thank you for sending this case to me. I have reviewed the entire note and agree with the plan.   Amada Jupiter, MD  ____________________________________________________________

## 2023-09-09 ENCOUNTER — Ambulatory Visit: Payer: Self-pay | Admitting: Gastroenterology

## 2023-09-14 ENCOUNTER — Ambulatory Visit (HOSPITAL_BASED_OUTPATIENT_CLINIC_OR_DEPARTMENT_OTHER): Admitting: Pulmonary Disease

## 2023-09-14 ENCOUNTER — Encounter (HOSPITAL_BASED_OUTPATIENT_CLINIC_OR_DEPARTMENT_OTHER): Payer: Self-pay | Admitting: Pulmonary Disease

## 2023-09-14 VITALS — BP 126/73 | HR 58 | Ht 65.0 in | Wt 151.0 lb

## 2023-09-14 DIAGNOSIS — J4530 Mild persistent asthma, uncomplicated: Secondary | ICD-10-CM

## 2023-09-14 DIAGNOSIS — D7219 Other eosinophilia: Secondary | ICD-10-CM

## 2023-09-14 NOTE — Progress Notes (Signed)
 Subjective:   PATIENT ID: Eric [name] GENDER: male DOB: [birth year], MRN: [id number]  Chief Complaint  Patient presents with   Follow-up    Asthma    Reason for Visit: Follow-up  [name] is a 68 year old male with asthma, allergic rhinitis, CAD, HLD, GERD who presents for follow-up.  Initial consult He previously had exercise-induced asthma in late adulthood. He is active at baseline however began having recurrent asthmatic bronchitis flares.  [facility] Note from 12/10/21 reviewed. Has had worsening DOE and cough in the last two months since having acute bronchitis in September and then COVID-19 in October treated with Paxlovid . Had recurrent flare at the end of October and again in the beginning of November.  He is on high dose Symbicort , xopenex , singulair . Every time he completes a steroid taper, his symptoms return. He has some chest tightness, cough with deep inspiration. Denies nocturnal symptoms. Has tachycardia at times and sleep issues.   Previously treated with allergies related to leaves etc however recent (June 2023) allergy  panel negative. Reports some gluten intolerance.  01/16/22 He reports he is compliant with his Advair. Able to exercise regularly and not needing albuterol . He does have some chest tightness at night. He does notice that sometimes he feels restricted while running. Sometimes will wake up at night with headache. This seemed to have started after COVID. Wife reports snoring at night. Denies withnessed apnea. He naps daily. Falls asleep easily.   03/23/22 He reports a cold at the end of December requiring steroids taper and Symbicort . This has resolved. Denies current shortness of breath, cough or wheezing. However he reports after receiving the covid booster on 03/02/22 he developed chest tightness x 2 weeks. He is not currently on any maintenance inhalers. Only needs albuterol  inhaler. He is still  exercising. He has been compliant with oxygen nightly.  06/26/22 Since our last visit overall doing well with some nasal congestion. Using nasocort. Has been compliant with his CPAP since our last visit. Unable to tell if his sleep quality has improved. He is compliant with Symbicort . Not using rescue inhaler or nebulizer. No exacerbations since our last visit. Active at baseline. No shortness of breath, cough or wheezing  09/18/22 He was doing well until 2 weeks ago but had mild asthma exacerbation (chest tightness, mild cough) which he believes is related to vaccine. Had weaned himself of Symbicort  one month ago. Restarted Symbicort  when started steroids and improving. Using albuterol  3 times a day and extra dose with exercise if needed. Has been compliant with his CPAP nightly except for the night of the power outage. Does not feel benefit from an energy perspective but does feel like he is moving air better at night with the humidifier.  09/14/23 Since our last visit he has weaned off Advair a few weeks ago. Rarely uses albuterol . Back to baseline. Denies shortness of breath cough or wheezing.  Social History: Negligible smoking history. 2 pack years  Past Medical History:  Diagnosis Date   Allergies    Allergy  1976   Asthma    Bronchitis due to COVID-19 virus 12/09/2021   Chicken pox    Chronic rhinitis 07/22/2021   Coronary artery disease, non-occlusive    Elevated Coronary Calcium  Score - Non-obstructive CAD (30-50%) on Coronary CTA   Dietary counseling and surveillance 07/22/2021   Dislocation of shoulder, left, closed    Eczema    Ganglion cyst 09/17/2010  GERD (gastroesophageal reflux disease)    Gluten intolerance    Leads to diffuse GI issues   Heart disease    Hyperlipidemia with target LDL less than 70 07/23/2016   Kidney disease    borderline CKD 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   Moderate obstructive sleep apnea 05/06/2022     Family History  Problem Relation Age of Onset   Hypertension Mother     Breast cancer Mother    Hearing loss Mother    Hyperlipidemia Father    Hypertension Father    Dementia Father    Heart disease Father    Prostate cancer Father    Heart attack Father        Paternal uncle also had MI   Depression Father    Obesity Father    Alcohol abuse Sister    Allergic rhinitis Brother    Hypertension Brother    Alcohol abuse Brother    Heart disease Paternal Grandfather    Coronary artery disease Paternal Uncle    Heart attack Paternal Uncle    Heart attack Paternal Uncle    Coronary artery disease Paternal Uncle    Angioedema Other    Eczema Other    Colon cancer Neg Hx    Colon polyps Neg Hx    Pancreatic cancer Neg Hx    Rectal cancer Neg Hx    Stomach cancer Neg Hx      Social History   Occupational History   Occupation: Production designer, theatre/television/film    Comment: Health and safety inspector  Tobacco Use   Smoking status: Former    Current packs/day: 0.00    Average packs/day: 1 pack/day for 2.0 years (2.0 ttl pk-yrs)    Types: Cigarettes    Start date: 07/03/1969    Quit date: 07/04/1971    Years since quitting: 52.2    Passive exposure: Never   Smokeless tobacco: Never  Vaping Use   Vaping status: Never Used  Substance and Sexual Activity   Alcohol use: Yes    Alcohol/week: 7.0 standard drinks of alcohol    Types: 7 Glasses of wine per week   Drug use: No   Sexual activity: Not Currently    Partners: Female    Birth control/protection: Surgical    Comment: vasectomy    Allergies  Allergen Reactions   Other     Other Reaction(s): Eye Irritation    sinus problems  Legumes   Gluten Meal Diarrhea and Other (See Comments)    GI     Outpatient Medications Prior to Visit  Medication Sig Dispense Refill   albuterol  (VENTOLIN  HFA) 108 (90 Base) MCG/ACT inhaler Inhale 2 puffs into the lungs every 4 (four) hours as needed for wheezing or shortness of breath (coughing fits). 18 g 1   ASPIRIN 81 PO Take 1 tablet by mouth daily as needed. (Patient taking differently:  Take 1 tablet by mouth daily.)     atorvastatin  (LIPITOR) 80 MG tablet Take 1 tablet (80 mg total) by mouth at bedtime. 90 tablet 1   b complex vitamins capsule Take 1 capsule by mouth daily.     clotrimazole-betamethasone (LOTRISONE) cream Apply 1 Application topically as needed.     fluticasone  (FLONASE  ALLERGY  RELIEF) 50 MCG/ACT nasal spray Place 1 spray into both nostrils as needed.     fluticasone -salmeterol (ADVAIR DISKUS) 100-50 MCG/ACT AEPB 1 puff twice a day and rinse mouth after each use. May decrease Advair to 1 puff once a day. If you notice worsening breathing issues then go back up  to 1 puff twice a day. 60 each 1   Omeprazole 20 MG TBEC Take 40 mg by mouth.     Probiotic Product (PROBIOTIC ADVANCED PO) Take by mouth.     TH MAGNESIUM PO Take by mouth.     No facility-administered medications prior to visit.    Review of Systems  Constitutional:  Negative for chills, diaphoresis, fever, malaise/fatigue and weight loss.  HENT:  Negative for congestion.   Respiratory:  Negative for cough, hemoptysis, sputum production, shortness of breath and wheezing.   Cardiovascular:  Negative for chest pain, palpitations and leg swelling.     Objective:   Vitals:   09/14/23 1313  BP: 126/73  Pulse: (!) 58  SpO2: 100%  Weight: 151 lb (68.5 kg)  Height: 5' 5 (1.651 m)    SpO2: 100 % Physical Exam: General: Well-appearing, no acute distress HENT: [place], AT Eyes: EOMI, no scleral icterus Respiratory: Clear to auscultation bilaterally.  No crackles, wheezing or rales Cardiovascular: RRR, -M/R/G, no JVD Extremities:-Edema,-tenderness Neuro: AAO x4, CNII-XII grossly intact Psych: Normal mood, normal affect   Data Reviewed:  Imaging: CT Cardiac 07/28/21 - Visualized lung parenchyma with no pulmonary nodules, masses, infiltrate, effusion or pneumothorax. CXR 12/08/21 - Mild bronchitic wall thickening Cardiac PET/CT - Visualized lung with normal parenchyma  PFT: Spirometry  07/22/21 FVC 3.94 (104%) FEV1 2.93 (104%) Ratio 74%  Spirometry 07/29/23 FVC 4.23 (116%) FEV1 3.40 (121%) Ratio 80   Interpretation: Normal spirometry   Sleep: Home sleep test 05/04/22 - AHI 23.4 with nadir SpO2 76% on room air Interpretation: Moderate sleep apnea with desaturations  Labs: CBC    Component Value Date/Time   WBC 3.9 (L) 09/06/2023 0929   RBC 4.83 09/06/2023 0929   HGB 14.1 09/06/2023 0929   HGB 14.8 03/23/2022 0955   HCT 42.6 09/06/2023 0929   HCT 45.5 03/23/2022 0955   PLT 224.0 09/06/2023 0929   PLT 397 03/23/2022 0955   MCV 88.2 09/06/2023 0929   MCV 88 03/23/2022 0955   MCH 28.7 03/23/2022 0955   MCH 29.3 01/13/2022 1058   MCHC 33.1 09/06/2023 0929   RDW 14.7 09/06/2023 0929   RDW 13.2 03/23/2022 0955   LYMPHSABS 1.2 09/06/2023 0929   LYMPHSABS 2.0 03/23/2022 0955   MONOABS 0.5 09/06/2023 0929   EOSABS 0.3 09/06/2023 0929   EOSABS 0.7 (H) 03/23/2022 0955   BASOSABS 0.0 09/06/2023 0929   BASOSABS 0.1 03/23/2022 0955   Absolute eos  12/08/21 - 3300 12/19/21 - 500 12/24/21 - 400 01/13/22 - 300 02/05/22 - 900 03/23/22 - 700 09/06/23 - 300  IgE 12/24/21 - 201  CPAP Compliance 08/19/22-09/17/22 82/90days (92%) >4 hours 81 (90%) Avg use 7 hours 15 min AHI 2.8     Assessment & Plan:   Discussion: 68 year old male with asthma, allergic rhinitis, CAD, HLD, GERD who presents for follow-up for eosinophilic asthma and OSA. History of multiple exacerbations in setting of severe peripheral eosinophilia from Sept to Nov 2023. Peak abs eos 3300. Seen by Hematology with thought this is secondary to acute COVID. Recent peripheral eosinophilia improved to 300-400 range. Asymptomatic off inhalers. Asthma likely provoked by hypereosinophilia which is now stable.   Hx of hypereosinophilia - within appropriate range --Monitor CBC with diff every 6 months with primary care doctor  Hx asthma  - resolved --Previously on Advair 100-50 mcg  --Normal spirometry on 08/05/22  with allergy  --Continue Albuterol  AS NEEDED for shortness of breath or wheezing  Moderate obstructive sleep apnea  OSA --Counseled on sleep hygiene --Counseled on weight loss/maintenance of healthy weight --Counseled NOT to drive if/when sleepy --Advised patient to wear CPAP for at least 4 hours each night for greater than 70% of the time to avoid the machine being repossessed by insurance. --CONTINUE auto CPAP 5-15 cm H20  --DME: [employer]    Health Maintenance Immunization History  Administered Date(s) Administered   Fluzone Influenza virus vaccine,trivalent (IIV3), split virus 11/28/2018, 11/21/2019   Influenza Split 12/23/2011   Influenza,inj,Quad PF,6+ Mos 01/17/2014, 12/11/2014, 12/11/2015, 12/11/2016, 11/14/2020   Moderna Covid-19 Vaccine Bivalent Booster 66yrs & up 10/21/2020   Moderna Sars-Covid-2 Vaccination 05/23/2020, 11/04/2022   PFIZER(Purple Top)SARS-COV-2 Vaccination 04/09/2019, 05/02/2019, 11/21/2019, 05/23/2020   PNEUMOCOCCAL CONJUGATE-20 09/09/2021   Pfizer Covid-19 Vaccine Bivalent Booster 47yrs & up 10/21/2020   Pfizer(Comirnaty)Fall Seasonal Vaccine 12 years and older 03/02/2022   Td 05/15/2008   Zoster, Live 03/02/2022   CT Lung Screen - not qualified  No orders of the defined types were placed in this encounter.  No orders of the defined types were placed in this encounter.   Return in about 1 year (around 09/13/2024).  I have spent a total time of 30-minutes on the day of the appointment including chart review, data review, collecting history, coordinating care and discussing medical diagnosis and plan with the patient/family. Past medical history, allergies, medications were reviewed. Pertinent imaging, labs and tests included in this note have been reviewed and interpreted independently by me.  Dashanna Kinnamon Slater Staff, MD [facility] 09/14/2023 1:32 PM

## 2023-09-14 NOTE — Patient Instructions (Addendum)
 Moderate obstructive sleep apnea OSA --Counseled on sleep hygiene --Counseled on weight loss/maintenance of healthy weight --Counseled NOT to drive if/when sleepy --Advised patient to wear CPAP for at least 4 hours each night for greater than 70% of the time to avoid the machine being repossessed by insurance. --CONTINUE auto CPAP 5-15 cm H20  --DME: [employer]  Hx of hypereosinophilia  --Monitor CBC with diff every 6 months with primary care doctor  Hx asthma  - resolved --Previously on Advair 100-50 mcg  --Normal spirometry on 08/05/22 with allergy  --Continue Albuterol  AS NEEDED for shortness of breath or wheezing

## 2023-09-22 ENCOUNTER — Ambulatory Visit (INDEPENDENT_AMBULATORY_CARE_PROVIDER_SITE_OTHER): Admitting: *Deleted

## 2023-09-22 VITALS — Ht 65.0 in | Wt 151.0 lb

## 2023-09-22 DIAGNOSIS — Z0000 Encounter for general adult medical examination without abnormal findings: Secondary | ICD-10-CM

## 2023-09-22 NOTE — Patient Instructions (Signed)
 Eric [name] , Thank you for taking time to come for your Medicare Wellness Visit. I appreciate your ongoing commitment to your health goals. Please review the following plan we discussed and let me know if I can assist you in the future.   Screening recommendations/referrals: Colonoscopy: up to date Recommended yearly ophthalmology/optometry visit for glaucoma screening and checkup Recommended yearly dental visit for hygiene and checkup  Vaccinations: Influenza vaccine: Education provided Pneumococcal vaccine: up to date Tdap vaccine: Education provided Shingles vaccine: up to date        Preventive Care 65 Years and Older, Male Preventive care refers to lifestyle choices and visits with your health care provider that can promote health and wellness. What does preventive care include? A yearly physical exam. This is also called an annual well check. Dental exams once or twice a year. Routine eye exams. Ask your health care provider how often you should have your eyes checked. Personal lifestyle choices, including: Daily care of your teeth and gums. Regular physical activity. Eating a healthy diet. Avoiding tobacco and drug use. Limiting alcohol use. Practicing safe sex. Taking low doses of aspirin every day. Taking vitamin and mineral supplements as recommended by your health care provider. What happens during an annual well check? The services and screenings done by your health care provider during your annual well check will depend on your age, overall health, lifestyle risk factors, and family history of disease. Counseling  Your health care provider may ask you questions about your: Alcohol use. Tobacco use. Drug use. Emotional well-being. Home and relationship well-being. Sexual activity. Eating habits. History of falls. Memory and ability to understand (cognition). Work and work Astronomer. Screening  You may have the following tests or measurements: Height,  weight, and BMI. Blood pressure. Lipid and cholesterol levels. These may be checked every 5 years, or more frequently if you are over 70 years old. Skin check. Lung cancer screening. You may have this screening every year starting at age 35 if you have a 30-pack-year history of smoking and currently smoke or have quit within the past 15 years. Fecal occult blood test (FOBT) of the stool. You may have this test every year starting at age 41. Flexible sigmoidoscopy or colonoscopy. You may have a sigmoidoscopy every 5 years or a colonoscopy every 10 years starting at age 21. Prostate cancer screening. Recommendations will vary depending on your family history and other risks. Hepatitis C blood test. Hepatitis B blood test. Sexually transmitted disease (STD) testing. Diabetes screening. This is done by checking your blood sugar (glucose) after you have not eaten for a while (fasting). You may have this done every 1-3 years. Abdominal aortic aneurysm (AAA) screening. You may need this if you are a current or former smoker. Osteoporosis. You may be screened starting at age 51 if you are at high risk. Talk with your health care provider about your test results, treatment options, and if necessary, the need for more tests. Vaccines  Your health care provider may recommend certain vaccines, such as: Influenza vaccine. This is recommended every year. Tetanus, diphtheria, and acellular pertussis (Tdap, Td) vaccine. You may need a Td booster every 10 years. Zoster vaccine. You may need this after age 61. Pneumococcal 13-valent conjugate (PCV13) vaccine. One dose is recommended after age 59. Pneumococcal polysaccharide (PPSV23) vaccine. One dose is recommended after age 42. Talk to your health care provider about which screenings and vaccines you need and how often you need them. This information is not intended  to replace advice given to you by your health care provider. Make sure you discuss any  questions you have with your health care provider. Document Released: 02/15/2015 Document Revised: 10/09/2015 Document Reviewed: 11/20/2014 Elsevier Interactive Patient Education  2017 [employer].  Fall Prevention in the Home Falls can cause injuries. They can happen to people of all ages. There are many things you can do to make your home safe and to help prevent falls. What can I do on the outside of my home? Regularly fix the edges of walkways and driveways and fix any cracks. Remove anything that might make you trip as you walk through a door, such as a raised step or threshold. Trim any bushes or trees on the path to your home. Use bright outdoor lighting. Clear any walking paths of anything that might make someone trip, such as rocks or tools. Regularly check to see if handrails are loose or broken. Make sure that both sides of any steps have handrails. Any raised decks and porches should have guardrails on the edges. Have any leaves, snow, or ice cleared regularly. Use sand or salt on walking paths during winter. Clean up any spills in your garage right away. This includes oil or grease spills. What can I do in the bathroom? Use night lights. Install grab bars by the toilet and in the tub and shower. Do not use towel bars as grab bars. Use non-skid mats or decals in the tub or shower. If you need to sit down in the shower, use a plastic, non-slip stool. Keep the floor dry. Clean up any water that spills on the floor as soon as it happens. Remove soap buildup in the tub or shower regularly. Attach bath mats securely with double-sided non-slip rug tape. Do not have throw rugs and other things on the floor that can make you trip. What can I do in the bedroom? Use night lights. Make sure that you have a light by your bed that is easy to reach. Do not use any sheets or blankets that are too big for your bed. They should not hang down onto the floor. Have a firm chair that has side  arms. You can use this for support while you get dressed. Do not have throw rugs and other things on the floor that can make you trip. What can I do in the kitchen? Clean up any spills right away. Avoid walking on wet floors. Keep items that you use a lot in easy-to-reach places. If you need to reach something above you, use a strong step stool that has a grab bar. Keep electrical cords out of the way. Do not use floor polish or wax that makes floors slippery. If you must use wax, use non-skid floor wax. Do not have throw rugs and other things on the floor that can make you trip. What can I do with my stairs? Do not leave any items on the stairs. Make sure that there are handrails on both sides of the stairs and use them. Fix handrails that are broken or loose. Make sure that handrails are as long as the stairways. Check any carpeting to make sure that it is firmly attached to the stairs. Fix any carpet that is loose or worn. Avoid having throw rugs at the top or bottom of the stairs. If you do have throw rugs, attach them to the floor with carpet tape. Make sure that you have a light switch at the top of the stairs and  the bottom of the stairs. If you do not have them, ask someone to add them for you. What else can I do to help prevent falls? Wear shoes that: Do not have high heels. Have rubber bottoms. Are comfortable and fit you well. Are closed at the toe. Do not wear sandals. If you use a stepladder: Make sure that it is fully opened. Do not climb a closed stepladder. Make sure that both sides of the stepladder are locked into place. Ask someone to hold it for you, if possible. Clearly mark and make sure that you can see: Any grab bars or handrails. First and last steps. Where the edge of each step is. Use tools that help you move around (mobility aids) if they are needed. These include: Canes. Walkers. Scooters. Crutches. Turn on the lights when you go into a dark area.  Replace any light bulbs as soon as they burn out. Set up your furniture so you have a clear path. Avoid moving your furniture around. If any of your floors are uneven, fix them. If there are any pets around you, be aware of where they are. Review your medicines with your doctor. Some medicines can make you feel dizzy. This can increase your chance of falling. Ask your doctor what other things that you can do to help prevent falls. This information is not intended to replace advice given to you by your health care provider. Make sure you discuss any questions you have with your health care provider. Document Released: 11/15/2008 Document Revised: 06/27/2015 Document Reviewed: 02/23/2014 Elsevier Interactive Patient Education  2017 [employer].

## 2023-09-22 NOTE — Progress Notes (Signed)
 Subjective:   Eric [name] is a 68 y.o. male who presents for Medicare Annual/Subsequent preventive examination.  Visit Complete: Virtual I connected with  Eric [name] on 09/22/23 by a audio enabled telemedicine application and verified that I am speaking with the correct person using two identifiers.  Patient Location: Home  Provider Location: Home Office  I discussed the limitations of evaluation and management by telemedicine. The patient expressed understanding and agreed to proceed.  Vital Signs: Because this visit was a virtual/telehealth visit, some criteria may be missing or patient reported. Any vitals not documented were not able to be obtained and vitals that have been documented are patient reported.  Patient Medicare AWV questionnaire was completed by the patient on 09-20-2023; I have confirmed that all information answered by patient is correct and no changes since this date.  Cardiac Risk Factors include: advanced age (>18men, >88 women);obesity (BMI >30kg/m2)     Objective:    Today's Vitals   09/22/23 1543  Weight: 151 lb (68.5 kg)  Height: 5' 5 (1.651 m)   Body mass index is 25.13 kg/m.     09/22/2023    3:43 PM 09/09/2022    3:42 PM 01/13/2022   11:21 AM 09/09/2021    8:15 AM 11/27/2015    8:39 AM  Advanced Directives  Does Patient Have a Medical Advance Directive? Yes Yes No Yes No   Type of Estate agent of [employer] Power of [place];Living will  Healthcare Power of [place];Living will   Does patient want to make changes to medical advance directive?    No - Patient declined   Copy of Healthcare Power of Attorney in Chart? No - copy requested No - copy requested  No - copy requested   Would patient like information on creating a medical advance directive?   No - Patient declined       Data saved with a previous flowsheet row definition    Current Medications (verified) Outpatient Encounter Medications as of  09/22/2023  Medication Sig   albuterol  (VENTOLIN  HFA) 108 (90 Base) MCG/ACT inhaler Inhale 2 puffs into the lungs every 4 (four) hours as needed for wheezing or shortness of breath (coughing fits).   ASPIRIN 81 PO Take 1 tablet by mouth daily as needed.   atorvastatin  (LIPITOR) 80 MG tablet Take 1 tablet (80 mg total) by mouth at bedtime.   b complex vitamins capsule Take 1 capsule by mouth daily.   clotrimazole-betamethasone (LOTRISONE) cream Apply 1 Application topically as needed.   fluticasone  (FLONASE  ALLERGY  RELIEF) 50 MCG/ACT nasal spray Place 1 spray into both nostrils as needed.   fluticasone -salmeterol (ADVAIR DISKUS) 100-50 MCG/ACT AEPB 1 puff twice a day and rinse mouth after each use. May decrease Advair to 1 puff once a day. If you notice worsening breathing issues then go back up to 1 puff twice a day.   Omeprazole 20 MG TBEC Take 40 mg by mouth.   Probiotic Product (PROBIOTIC ADVANCED PO) Take by mouth.   TH MAGNESIUM PO Take by mouth.   No facility-administered encounter medications on file as of 09/22/2023.    Allergies (verified) Other and Gluten meal   History: Past Medical History:  Diagnosis Date   Allergies    Allergy  1976   Asthma    Bronchitis due to COVID-19 virus 12/09/2021   Chicken pox    Chronic rhinitis 07/22/2021   Coronary artery disease, non-occlusive    Elevated Coronary Calcium  Score - Non-obstructive CAD (  30-50%) on Coronary CTA   Dietary counseling and surveillance 07/22/2021   Dislocation of shoulder, left, closed    Eczema    Ganglion cyst 09/17/2010   GERD (gastroesophageal reflux disease)    Gluten intolerance    Leads to diffuse GI issues   Heart disease    Hyperlipidemia with target LDL less than 70 07/23/2016   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   Coronary CT Angiograpm  July-August 2018   1) Calcium  score 147 which is 74th percentile for age and sex 2) Right dominant coronary arteries with Less than 30% calcified plaque in proximal LAD. Less than 50% calcified plaque in proximal RCA, Less than 30% soft plauqe in mid RCA and Less than 30% calcified plaque in distal RCA   ELBOW SURGERY Left    repair tendon   MOUTH SURGERY  1965   TOE SURGERY  2010   TONSILLECTOMY  1962   VASECTOMY     Family History  Problem Relation Age of Onset   Hypertension Mother    Breast cancer Mother    Hearing loss Mother    Hyperlipidemia Father    Hypertension Father    Dementia Father    Heart disease Father    Prostate cancer Father    Heart attack Father        Paternal uncle also had MI   Depression Father    Obesity Father    Alcohol abuse Sister    Allergic rhinitis Brother    Hypertension Brother    Alcohol abuse Brother    Heart disease Paternal Grandfather    Coronary artery disease Paternal Uncle    Heart attack Paternal Uncle    Heart attack Paternal Uncle    Coronary artery disease Paternal Uncle    Angioedema Other    Eczema Other    Colon cancer Neg Hx    Colon polyps Neg Hx    Pancreatic cancer Neg Hx    Rectal cancer Neg Hx    Stomach cancer Neg Hx    Social History   Socioeconomic History   Marital status: Married    Spouse name: Not on file   Number of children: 4   Years of education: Not on file   Highest education level: Master's degree (e.g., MA, MS, MEng, MEd, MSW, MBA)  Occupational History   Occupation: Production designer, theatre/television/film    Comment: Health and safety inspector  Tobacco Use   Smoking status: Former    Current packs/day: 0.00    Average packs/day: 1 pack/day for 2.0 years (2.0 ttl pk-yrs)    Types: Cigarettes    Start date: 07/03/1969    Quit date: 07/04/1971    Years since quitting: 52.2    Passive exposure: Never   Smokeless tobacco: Never  Vaping Use   Vaping status: Never Used  Substance and Sexual Activity    Alcohol use: Yes    Alcohol/week: 7.0 standard drinks of alcohol    Types: 7 Glasses of wine per week   Drug use: No   Sexual activity: Not Currently    Partners: Female    Birth control/protection: Surgical    Comment: vasectomy  Other Topics Concern   Not on file  Social History Narrative   He is been married for 56  years.    Masters degree executive VP for [employer]   He is an avid exercise enthusiast - runs, bicycling and swimming at least 5 days a week for 40 minutes in time.   Has smoke alarm in the home   Wears a seatbelt.   Feels safe in his relationship.   Social Drivers of Corporate investment banker Strain: Low Risk  (09/22/2023)   Overall Financial Resource Strain (CARDIA)    Difficulty of Paying Living Expenses: Not hard at all  Food Insecurity: No Food Insecurity (09/22/2023)   Hunger Vital Sign    Worried About Running Out of Food in the Last Year: Never true    Ran Out of Food in the Last Year: Never true  Transportation Needs: No Transportation Needs (09/22/2023)   PRAPARE - Administrator, Civil Service (Medical): No    Lack of Transportation (Non-Medical): No  Physical Activity: Sufficiently Active (09/22/2023)   Exercise Vital Sign    Days of Exercise per Week: 6 days    Minutes of Exercise per Session: 90 min  Stress: No Stress Concern Present (09/22/2023)   [employer] of Occupational Health - Occupational Stress Questionnaire    Feeling of Stress: Not at all  Social Connections: Moderately Integrated (09/22/2023)   Social Connection and Isolation Panel    Frequency of Communication with Friends and Family: More than three times a week    Frequency of Social Gatherings with Friends and Family: Three times a week    Attends Religious Services: Never    Active Member of Clubs or Organizations: Yes    Attends Engineer, structural: More than 4 times per year    Marital Status: Married    Tobacco Counseling Counseling given:  Not Answered   Clinical Intake:  Pre-visit preparation completed: Yes  Pain : No/denies pain     Diabetes: No  How often do you need to have someone help you when you read instructions, pamphlets, or other written materials from your doctor or pharmacy?: 1 - Never  Interpreter Needed?: No  Information entered by :: Mliss Graff LPN   Activities of Daily Living    09/22/2023    3:45 PM 09/20/2023   10:40 AM  In your present state of health, do you have any difficulty performing the following activities:  Hearing? 0 0  Vision? 0 0  Difficulty concentrating or making decisions? 0 0  Walking or climbing stairs? 0 0  Dressing or bathing? 0 0  Doing errands, shopping? 0 0  Preparing Food and eating ? N N  Using the Toilet? N N  In the past six months, have you accidently leaked urine? N N  Do you have problems with loss of bowel control? N N  Managing your Medications? N N  Managing your Finances? N N  Housekeeping or managing your Housekeeping? N N    Patient Care Team: Catherine Charlies LABOR, DO as PCP - General (Family Medicine) [facility] as Consulting Physician (Optometry) Luke Orlan HERO, DO as Consulting Physician (Allergy ) Kassie Acquanetta Bradley, MD as Consulting Physician (Pulmonary Disease) Anner Alm ORN, MD as Consulting Physician (Cardiology)  Indicate any recent Medical Services you may have received from other than [facility] providers in the past year (date may be approximate).     Assessment:   This is a routine wellness examination for Marris.  Hearing/Vision screen Hearing Screening - Comments:: No trouble hearing Vision Screening - Comments:: [facility] Up  to date   Goals Addressed             This Visit's Progress    Patient Stated   On track    Stay active      Patient Stated       Continue exercise regimate       Depression Screen    09/22/2023    3:46 PM 04/26/2023    8:25 AM 11/09/2022    8:08 AM 09/09/2022    3:42 PM 02/05/2022   11:59 AM  09/09/2021    8:13 AM 06/06/2021    1:46 PM  PHQ 2/9 Scores  PHQ - 2 Score 0 0 0 0 0 0 0  PHQ- 9 Score 0 2         Fall Risk    09/22/2023    4:10 PM 09/20/2023   10:40 AM 04/26/2023    8:25 AM 11/09/2022    8:08 AM 11/05/2022    2:01 PM  Fall Risk   Falls in the past year? 0 0 0 0 0  Number falls in past yr: 0 0  0   Injury with Fall? 0 0  0   Risk for fall due to :    No Fall Risks   Follow up Falls evaluation completed;Education provided;Falls prevention discussed  Falls evaluation completed Falls evaluation completed     MEDICARE RISK AT HOME: Medicare Risk at Home Any stairs in or around the home?: Yes If so, are there any without handrails?: Yes Home free of loose throw rugs in walkways, pet beds, electrical cords, etc?: No Adequate lighting in your home to reduce risk of falls?: Yes Life alert?: No Use of a cane, walker or w/c?: No Grab bars in the bathroom?: No Shower chair or bench in shower?: Yes Elevated toilet seat or a handicapped toilet?: No  TIMED UP AND GO:  Was the test performed?  No    Cognitive Function:        09/22/2023    3:45 PM 09/09/2022    3:44 PM 09/09/2021    8:13 AM  6CIT Screen  What Year? 0 points 0 points 0 points  What month? 0 points 0 points 0 points  What time? 0 points 0 points 0 points  Count back from 20 0 points 0 points 0 points  Months in reverse 0 points 0 points 0 points  Repeat phrase 0 points 0 points 0 points  Total Score 0 points 0 points 0 points    Immunizations Immunization History  Administered Date(s) Administered   Fluzone Influenza virus vaccine,trivalent (IIV3), split virus 11/28/2018, 11/21/2019   Influenza Split 12/23/2011   Influenza,inj,Quad PF,6+ Mos 01/17/2014, 12/11/2014, 12/11/2015, 12/11/2016, 11/14/2020   Moderna Covid-19 Vaccine Bivalent Booster 26yrs & up 10/21/2020   Moderna Sars-Covid-2 Vaccination 05/23/2020, 11/04/2022   PFIZER(Purple Top)SARS-COV-2 Vaccination 04/09/2019, 05/02/2019,  11/21/2019, 05/23/2020   PNEUMOCOCCAL CONJUGATE-20 09/09/2021   Pfizer Covid-19 Vaccine Bivalent Booster 77yrs & up 10/21/2020   Pfizer(Comirnaty)Fall Seasonal Vaccine 12 years and older 03/02/2022   Td 05/15/2008   Zoster, Live 03/02/2022    TDAP status: Due, Education has been provided regarding the importance of this vaccine. Advised may receive this vaccine at local pharmacy or Health Dept. Aware to provide a copy of the vaccination record if obtained from local pharmacy or Health Dept. Verbalized acceptance and understanding.  Flu Vaccine status: Due, Education has been provided regarding the importance of this vaccine. Advised may receive this vaccine at local pharmacy or  Health Dept. Aware to provide a copy of the vaccination record if obtained from local pharmacy or Health Dept. Verbalized acceptance and understanding.  Pneumococcal vaccine status: Up to date  Covid-19 vaccine status: Information provided on how to obtain vaccines.   Qualifies for Shingles Vaccine? No   Zostavax completed Yes   Shingrix Completed?: Yes  Screening Tests Health Maintenance  Topic Date Due   INFLUENZA VACCINE  09/03/2023   Medicare Annual Wellness (AWV)  09/21/2024   Colonoscopy  12/29/2025   Pneumococcal Vaccine: 50+ Years  Completed   Hepatitis C Screening  Completed   HPV VACCINES  Aged Out   Meningococcal B Vaccine  Aged Out   DTaP/Tdap/Td  Discontinued   COVID-19 Vaccine  Discontinued   Zoster Vaccines- Shingrix  Discontinued    Health Maintenance  Health Maintenance Due  Topic Date Due   INFLUENZA VACCINE  09/03/2023    Colorectal cancer screening: Type of screening: Colonoscopy. Completed 2017. Repeat every 10 years  Lung Cancer Screening: (Low Dose CT Chest recommended if Age 52-80 years, 20 pack-year currently smoking OR have quit w/in 15years.) does not qualify.   Lung Cancer Screening Referral:   Additional Screening:  Hepatitis C Screening: does not qualify;  Completed 2023  Vision Screening: Recommended annual ophthalmology exams for early detection of glaucoma and other disorders of the eye. Is the patient up to date with their annual eye exam?  Yes  Who is the provider or what is the name of the office in which the patient attends annual eye exams? [facility] If pt is not established with a provider, would they like to be referred to a provider to establish care? No .   Dental Screening: Recommended annual dental exams for proper oral hygiene    Community Resource Referral / Chronic Care Management: CRR required this visit?  No   CCM required this visit?  No     Plan:     I have personally reviewed and noted the following in the patient's chart:   Medical and social history Use of alcohol, tobacco or illicit drugs  Current medications and supplements including opioid prescriptions. Patient is not currently taking opioid prescriptions. Functional ability and status Nutritional status Physical activity Advanced directives List of other physicians Hospitalizations, surgeries, and ER visits in previous 12 months Vitals Screenings to include cognitive, depression, and falls Referrals and appointments  In addition, I have reviewed and discussed with patient certain preventive protocols, quality metrics, and best practice recommendations. A written personalized care plan for preventive services as well as general preventive health recommendations were provided to patient.     Mliss Graff, LPN   [id number]   After Visit Summary: (MyChart) Due to this being a telephonic visit, the after visit summary with patients personalized plan was offered to patient via MyChart   Nurse Notes:

## 2023-09-27 ENCOUNTER — Encounter: Payer: Self-pay | Admitting: Gastroenterology

## 2023-09-27 ENCOUNTER — Ambulatory Visit (AMBULATORY_SURGERY_CENTER): Admitting: Gastroenterology

## 2023-09-27 VITALS — BP 122/73 | HR 51 | Temp 97.9°F | Resp 10 | Ht 65.0 in | Wt 155.0 lb

## 2023-09-27 DIAGNOSIS — K259 Gastric ulcer, unspecified as acute or chronic, without hemorrhage or perforation: Secondary | ICD-10-CM

## 2023-09-27 DIAGNOSIS — K3189 Other diseases of stomach and duodenum: Secondary | ICD-10-CM

## 2023-09-27 DIAGNOSIS — K2289 Other specified disease of esophagus: Secondary | ICD-10-CM

## 2023-09-27 DIAGNOSIS — K219 Gastro-esophageal reflux disease without esophagitis: Secondary | ICD-10-CM

## 2023-09-27 DIAGNOSIS — K229 Disease of esophagus, unspecified: Secondary | ICD-10-CM | POA: Diagnosis not present

## 2023-09-27 DIAGNOSIS — K319 Disease of stomach and duodenum, unspecified: Secondary | ICD-10-CM | POA: Diagnosis not present

## 2023-09-27 MED ORDER — SODIUM CHLORIDE 0.9 % IV SOLN: 500.0000 mL | Freq: Once | INTRAVENOUS | Status: DC

## 2023-09-27 NOTE — Patient Instructions (Signed)
 - Resume previous diet. - Await pathology results.  - In preprocedure discussion, this patient expressed desire to try getting off acid suppression therapy if he is able. If he misses medicine for a day or 2 he has return of heartburn. Whether or not this will be feasible depends on what symptoms may recur during the wean of acid suppression medicine as well as presence/absence of Barrett's GI biopsies question of patient's long-term need for aspirin with gastric erosion found today.  - Recommend initially decreasing the current omeprazole 40 mg daily to 20 mg once daily (he is currently taking the 20 mg tablets decrease to 1 of those tablets daily). If heartburn worsens increase back to 40 mg once daily. If there is not an increase in the heartburn after the decrease to 20 mg once daily, then discontinue omeprazole and start taking famotidine 40 mg once daily (two the OTC 20 mg tablets).Next available clinic visit to see me regarding reflux management. We can discuss medication management as well as possibility of antireflux procedure.   YOU HAD AN ENDOSCOPIC PROCEDURE TODAY AT THE [facility]:   Refer to the procedure report that was given to you for any specific questions about what was found during the examination.  If the procedure report does not answer your questions, please call your gastroenterologist to clarify.  If you requested that your care partner not be given the details of your procedure findings, then the procedure report has been included in a sealed envelope for you to review at your convenience later.  YOU SHOULD EXPECT: Some feelings of bloating in the abdomen. Passage of more gas than usual.  Walking can help get rid of the air that was put into your GI tract during the procedure and reduce the bloating. If you had a lower endoscopy (such as a colonoscopy or flexible sigmoidoscopy) you may notice spotting of blood in your stool or on the toilet paper. If you underwent a  bowel prep for your procedure, you may not have a normal bowel movement for a few days.  Please Note:  You might notice some irritation and congestion in your nose or some drainage.  This is from the oxygen used during your procedure.  There is no need for concern and it should clear up in a day or so.  SYMPTOMS TO REPORT IMMEDIATELY: Following upper endoscopy (EGD)  Vomiting of blood or coffee ground material  New chest pain or pain under the shoulder blades  Painful or persistently difficult swallowing  New shortness of breath  Fever of 100F or higher  Black, tarry-looking stools  For urgent or emergent issues, a gastroenterologist can be reached at any hour by calling [phone]. Do not use MyChart messaging for urgent concerns.    DIET:  We do recommend a small meal at first, but then you may proceed to your regular diet.  Drink plenty of fluids but you should avoid alcoholic beverages for 24 hours.  ACTIVITY:  You should plan to take it easy for the rest of today and you should NOT DRIVE or use heavy machinery until tomorrow (because of the sedation medicines used during the test).    FOLLOW UP: Our staff will call the number listed on your records the next business day following your procedure.  We will call around 7:15- 8:00 am to check on you and address any questions or concerns that you may have regarding the information given to you following your procedure. If we do  not reach you, we will leave a message.     If any biopsies were taken you will be contacted by phone or by letter within the next 1-3 weeks.  Please call us  at [phone] if you have not heard about the biopsies in 3 weeks.    SIGNATURES/CONFIDENTIALITY: You and/or your care partner have signed paperwork which will be entered into your electronic medical record.  These signatures attest to the fact that that the information above on your After Visit Summary has been reviewed and is understood.  Full  responsibility of the confidentiality of this discharge information lies with you and/or your care-partner.

## 2023-09-27 NOTE — Progress Notes (Signed)
 Called to room to assist during endoscopic procedure.  Patient ID and intended procedure confirmed with present staff. Received instructions for my participation in the procedure from the performing physician.

## 2023-09-27 NOTE — Op Note (Signed)
 [facility] Patient Name: Eric [name] Procedure Date: 09/27/2023 10:18 AM MRN: [id number] Endoscopist: Victory L. Legrand , MD, [id number] Age: 68 Referring MD:  Date of Birth: Feb 10, 1955 Gender: Male Account #: [id number] Procedure:                Upper GI endoscopy Indications:              Esophageal reflux symptoms that persist despite                            appropriate therapy Medicines:                Monitored Anesthesia Care Procedure:                Pre-Anesthesia Assessment:                           - Prior to the procedure, a History and Physical                            was performed, and patient medications and                            allergies were reviewed. The patient's tolerance of                            previous anesthesia was also reviewed. The risks                            and benefits of the procedure and the sedation                            options and risks were discussed with the patient.                            All questions were answered, and informed consent                            was obtained. Prior Anticoagulants: The patient has                            taken no anticoagulant or antiplatelet agents. ASA                            Grade Assessment: II - A patient with mild systemic                            disease. After reviewing the risks and benefits,                            the patient was deemed in satisfactory condition to                            undergo the procedure.  After obtaining informed consent, the endoscope was                            passed under direct vision. Throughout the                            procedure, the patient's blood pressure, pulse, and                            oxygen saturations were monitored continuously. The                            Olympus Scope [id number] was introduced through the                            mouth, and advanced to the second  part of duodenum.                            The upper GI endoscopy was accomplished without                            difficulty. The patient tolerated the procedure                            well. Scope In: Scope Out: Findings:                 The larynx was normal.                           There is no endoscopic evidence of esophagitis or                            hiatal hernia in the entire esophagus.                           One tongue of salmon-colored mucosa was present and                            a single diminutive adjacent island of                            salmon-colored mucosa were present. No other                            visible abnormalities were present under WL and                            NBI. The maximum longitudinal extent of these                            esophageal mucosal changes was 1-2 cm in length.                            Targeted biopsies were taken with  a cold forceps                            for histology. (1 jar)                           A single erosion ( approx 15mm length) with no                            bleeding and no stigmata of recent bleeding was                            found on the greater curvature of the gastric body.                            Biopsies were taken with a cold forceps for                            histology. (Antrum and body in the same pathology                            jar to rule out H. pylori)                           The exam of the stomach was otherwise normal,                            including on retroflexion. Stomach distended well                            with insufflation. (Hill grade 1)                           The examined duodenum was normal. Complications:            No immediate complications. Estimated Blood Loss:     Estimated blood loss was minimal. Impression:               - Normal larynx.                           - Salmon-colored mucosa suspicious for                             short-segment Barrett's esophagus. Biopsied.                           - Gastric erosion with no bleeding and no stigmata                            of recent bleeding. Biopsied.                           - Normal examined duodenum. Recommendation:           - Patient has a contact number available for  emergencies. The signs and symptoms of potential                            delayed complications were discussed with the                            patient. Return to normal activities tomorrow.                            Written discharge instructions were provided to the                            patient.                           - Resume previous diet.                           - Await pathology results.                           - In preprocedure discussion, this patient                            expressed desire to try getting off acid                            suppression therapy if he is able. If he misses                            medicine for a day or 2 he has return of heartburn.                           Whether or not this will be feasible depends on                            what symptoms may recur during the wean of acid                            suppression medicine as well as presence/absence of                            Barrett's GI biopsies question of patient's                            long-term need for aspirin with gastric erosion                            found today.                           Recommend initially decreasing the current                            omeprazole 40 mg daily to 20  mg once daily (he is                            currently taking the 20 mg tablets decrease to 1 of                            those tablets daily). If heartburn worsens increase                            back to 40 mg once daily.                           If there is not an increase in the heartburn after                             the decrease to 20 mg once daily, then discontinue                            omeprazole and start taking famotidine 40 mg once                            daily (two the OTC 20 mg tablets).                           Next available clinic visit to see me regarding                            reflux management. We can discuss medication                            management as well as possibility of antireflux                            procedure. Brandee Markin L. Legrand, MD 09/27/2023 10:52:04 AM This report has been signed electronically.

## 2023-09-27 NOTE — Progress Notes (Signed)
 No significant changes to clinical history since GI office visit on 09/06/23.  The patient is appropriate for an endoscopic procedure in the ambulatory setting.  - Victory Brand, MD

## 2023-09-27 NOTE — Progress Notes (Signed)
 Pt's states no medical or surgical changes since previsit or office visit.

## 2023-09-28 ENCOUNTER — Telehealth: Payer: Self-pay

## 2023-09-28 NOTE — Telephone Encounter (Signed)
  Follow up Call-     09/27/2023    9:15 AM  Call back number  Post procedure Call Back phone  # [phone]  Permission to leave phone message Yes     Patient questions:  Do you have a fever, pain , or abdominal swelling? No. Pain Score  0 *  Have you tolerated food without any problems? Yes.    Have you been able to return to your normal activities? Yes.    Do you have any questions about your discharge instructions: Diet   No. Medications  No. Follow up visit  No.  Do you have questions or concerns about your Care? No.  Actions: * If pain score is 4 or above: No action needed, pain <4.

## 2023-09-29 LAB — SURGICAL PATHOLOGY

## 2023-09-30 ENCOUNTER — Ambulatory Visit: Payer: Self-pay | Admitting: Gastroenterology

## 2023-10-01 ENCOUNTER — Other Ambulatory Visit: Payer: Self-pay

## 2023-10-20 ENCOUNTER — Encounter: Admitting: Internal Medicine

## 2023-10-28 ENCOUNTER — Ambulatory Visit: Admitting: Gastroenterology

## 2023-10-28 ENCOUNTER — Encounter: Payer: Self-pay | Admitting: Gastroenterology

## 2023-10-28 VITALS — BP 130/78 | HR 58 | Ht 65.0 in | Wt 148.0 lb

## 2023-10-28 DIAGNOSIS — R195 Other fecal abnormalities: Secondary | ICD-10-CM | POA: Diagnosis not present

## 2023-10-28 DIAGNOSIS — K219 Gastro-esophageal reflux disease without esophagitis: Secondary | ICD-10-CM | POA: Diagnosis not present

## 2023-10-28 MED ORDER — OMEPRAZOLE 20 MG PO CPDR: 40.0000 mg | DELAYED_RELEASE_CAPSULE | Freq: Every morning | ORAL | 3 refills | Status: AC

## 2023-10-28 NOTE — Progress Notes (Signed)
 Eric [name] [id number] 08/28/1955   Chief Complaint: GERD  Referring Provider: Catherine Charlies LABOR, DO Primary GI MD: Dr. Legrand  HPI: Eric [name] is a 68 y.o. male with past medical history of moderate persistent asthma, chronic rhinitis, nonocclusive CAD, eczema, GERD, gluten intolerance, HLD, OSA, CKD who presents today for follow up.    07/29/2023 patient seen by allergy  and asthma, noted to have GERD symptoms if he misses his medication.  On omeprazole  40 mg once daily.  Advised to follow-up with GI.   No prior EGD. Has been on reflux medications for 30+ years.   Labs 11/09/2022 with normal CBC, CMP, TSH, hemoglobin A1c  At last visit 09/06/2023 patient reported longstanding history of GERD, having taken omeprazole  for many years.  Said he had an EGD a long time ago which was negative for Barrett's and records were unavailable.  He tried coming off PPI in the past unsuccessfully due to recurrence of symptoms.  Interested in discussing alternative treatment options following EGD, to include possible antireflux surgery.  Has been having occasional abdominal bloating and loose stools once or twice a month, started on probiotics at the recommendation of his dentist, and wanted to discuss SIBO testing.  Had been on gluten-free diet for 15 years which helped to clear up his eczema.  Labs were normal, negative for celiac disease.  He underwent EGD 09/27/2023 with finding of salmon-colored mucosa suspicious for short segment Barrett's, though biopsies were negative.  Had gastric erosion with no bleeding and no stigmata of recent bleeding, biopsies showing mild reactive gastropathy.  Per Dr. Legrand: Recommend initially decreasing the current omeprazole  40 mg daily to 20 mg once daily (he is currently taking the 20 mg tablets decrease to 1 of those tablets daily) . If heartburn worsens increase back to 40 mg once daily. If there is not an increase in the heartburn after the decrease to 20 mg once  daily, then discontinue omeprazole  and start taking famotidine 40 mg once daily (two the OTC 20 mg tablets). Next available clinic visit to see me regarding reflux management. We can discuss medication management as well as possibility of antireflux procedure.   Today patient states he continues to do well on omeprazole  40 mg daily.  Taking 220 mg tablets in the morning.  He would like a printed prescription so he can price compare at different pharmacies.  Has stopped taking probiotic and magnesium supplement.  Has not noticed any change since discontinuing the supplements.  He is interested in trying to wean off of omeprazole , though does not have confidence he will be able to come off the medication without recurrence of symptoms.  Does have concerns about long-term use of PPI, which she has already been taking for about 30 years now.  He is having regular bowel movements, with occasional loose stools which seems to be diet related.  States he has been on a gluten-free diet for a long time and recently tried eating pasta.  Did okay with rice pasta but regular pasta caused diarrhea the next day.  Issues with dairy as well though he seems to be able to tolerate dairy products that have a lower lactose content such as cheese, lactose free milk.  Denies any constipation, blood in his stool, melena.  No abdominal pain.  Discussed testing for SIBO at last visit, but because test was going to be expensive and symptoms seem to be controlled with dietary changes, he would like to hold off at this  time.  Open to considering empiric treatment with course of antibiotics for SIBO if symptoms worsen.  He recently ran a half marathon.  Previous GI Procedures/Imaging   EGD 09/27/2023 - Normal larynx.  GLENWOOD Biles- colored mucosa suspicious for short-segment Barrett' s esophagus. Biopsied. - Gastric erosion with no bleeding and no stigmata of recent bleeding. Biopsied.  - Normal examined duodenum. Path: 1.  Surgical [P], gastric biopsies :       - MILD REACTIVE GASTROPATHY.       - NEGATIVE FOR H. PYLORI ON H&E STAIN       - NO INTESTINAL METAPLASIA, DYSPLASIA, OR MALIGNANCY.        2. Surgical [P], distal esophagus :       - BENIGN SQUAMOUS MUCOSA WITH MILD REACTIVE CHANGES       - NEGATIVE FOR INTESTINAL METAPLASIA, DYSPLASIA OR MALIGNANCY   Colonoscopy 12/30/2015 - Internal hemorrhoids.  - The examination was otherwise normal on direct and retroflexion views.  - No polyps or cancers. - Recall 10 years   Past Medical History:  Diagnosis Date   Allergies    Allergy  1976   Asthma    Bronchitis due to COVID-19 virus 12/09/2021   Chicken pox    Chronic rhinitis 07/22/2021   Coronary artery disease, non-occlusive    Elevated Coronary Calcium  Score - Non-obstructive CAD (30-50%) on Coronary CTA   Dietary counseling and surveillance 07/22/2021   Dislocation of shoulder, left, closed    Eczema    Ganglion cyst 09/17/2010   GERD (gastroesophageal reflux disease)    Gluten intolerance    Leads to diffuse GI issues   Heart disease    Hyperlipidemia with target LDL less than 70 07/23/2016   Kidney disease    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   Coronary CT Angiograpm  July-August 2018   1) Calcium  score 147 which is 74th percentile for age and sex 2) Right dominant coronary arteries with Less than 30% calcified plaque in proximal LAD. Less than 50% calcified plaque in proximal RCA, Less than 30% soft plauqe in mid RCA and Less than 30% calcified plaque in distal RCA   ELBOW SURGERY Left    repair tendon   MOUTH SURGERY  1965   TOE SURGERY  2010   TONSILLECTOMY  1962   VASECTOMY      Current Outpatient Medications  Medication Sig Dispense Refill   albuterol  (VENTOLIN  HFA) 108 (90 Base) MCG/ACT inhaler  Inhale 2 puffs into the lungs every 4 (four) hours as needed for wheezing or shortness of breath (coughing fits). 18 g 1   ASPIRIN 81 PO Take 1 tablet by mouth daily as needed.     atorvastatin  (LIPITOR) 80 MG tablet Take 1 tablet (80 mg total) by mouth at bedtime. 90 tablet 1   b complex vitamins capsule Take 1 capsule by mouth daily.     clotrimazole-betamethasone (LOTRISONE) cream Apply 1 Application topically as needed.     fluticasone  (FLONASE  ALLERGY  RELIEF) 50 MCG/ACT nasal spray Place 1 spray into both nostrils as needed.     fluticasone -salmeterol (ADVAIR DISKUS) 100-50 MCG/ACT AEPB 1 puff twice a day and rinse mouth after each use. May decrease Advair to 1 puff once a day. If you notice worsening breathing issues then go back  up to 1 puff twice a day. 60 each 1   omeprazole  (PRILOSEC) 20 MG capsule Take 2 capsules (40 mg total) by mouth every morning. 180 capsule 3   No current facility-administered medications for this visit.    Allergies as of 10/28/2023 - Review Complete 10/28/2023  Allergen Reaction Noted   Gluten meal Diarrhea and Other (See Comments) 11/27/2015   Other Other (See Comments) 12/12/2015    Family History  Problem Relation Age of Onset   Hypertension Mother    Breast cancer Mother    Hearing loss Mother    Hyperlipidemia Father    Hypertension Father    Dementia Father    Heart disease Father    Prostate cancer Father    Heart attack Father        Paternal uncle also had MI   Depression Father    Obesity Father    Alcohol abuse Sister    Allergic rhinitis Brother    Hypertension Brother    Alcohol abuse Brother    Coronary artery disease Paternal Uncle    Heart attack Paternal Uncle    Heart attack Paternal Uncle    Coronary artery disease Paternal Uncle    Heart disease Paternal Grandfather    Angioedema Other    Eczema Other    Colon cancer Neg Hx    Colon polyps Neg Hx    Pancreatic cancer Neg Hx    Rectal cancer Neg Hx    Stomach cancer  Neg Hx    Esophageal cancer Neg Hx     Social History   Tobacco Use   Smoking status: Former    Current packs/day: 0.00    Average packs/day: 1 pack/day for 2.0 years (2.0 ttl pk-yrs)    Types: Cigarettes    Start date: 07/03/1969    Quit date: 07/04/1971    Years since quitting: 52.3    Passive exposure: Never   Smokeless tobacco: Never  Vaping Use   Vaping status: Never Used  Substance Use Topics   Alcohol use: Yes    Alcohol/week: 7.0 standard drinks of alcohol    Types: 7 Glasses of wine per week    Comment: occasional   Drug use: No     Review of Systems:    Constitutional: No fever, chills Cardiovascular: No chest pain Respiratory: No SOB  Gastrointestinal: See HPI and otherwise negative Hematologic: No bleeding     Physical Exam:  Vital signs: BP 130/78   Pulse (!) 58   Ht 5' 5 (1.651 m)   Wt 148 lb (67.1 kg)   BMI 24.63 kg/m   Constitutional: Pleasant, well-appearing male in NAD, alert and cooperative Head:  Normocephalic and atraumatic.  Eyes: No scleral icterus.  Mouth: No oral lesions. Respiratory: Respirations even and unlabored. Lungs clear to auscultation bilaterally.  No wheezes, crackles, or rhonchi.  Cardiovascular:  Regular rate and rhythm. No murmurs. No peripheral edema. Gastrointestinal:  Soft, nondistended, nontender. No rebound or guarding. Normal bowel sounds. No appreciable masses or hepatomegaly. Rectal:  Not performed.  Neurologic:  Alert and oriented x4;  grossly normal neurologically.  Skin:   Dry and intact without significant lesions or rashes. Psychiatric: Oriented to person, place and time. Demonstrates good judgement and reason without abnormal affect or behaviors.   RELEVANT LABS AND IMAGING: CBC    Component Value Date/Time   WBC 3.9 (L) 09/06/2023 0929   RBC 4.83 09/06/2023 0929   HGB 14.1 09/06/2023 0929   HGB 14.8 03/23/2022 0955  HCT 42.6 09/06/2023 0929   HCT 45.5 03/23/2022 0955   PLT 224.0 09/06/2023 0929    PLT 397 03/23/2022 0955   MCV 88.2 09/06/2023 0929   MCV 88 03/23/2022 0955   MCH 28.7 03/23/2022 0955   MCH 29.3 01/13/2022 1058   MCHC 33.1 09/06/2023 0929   RDW 14.7 09/06/2023 0929   RDW 13.2 03/23/2022 0955   LYMPHSABS 1.2 09/06/2023 0929   LYMPHSABS 2.0 03/23/2022 0955   MONOABS 0.5 09/06/2023 0929   EOSABS 0.3 09/06/2023 0929   EOSABS 0.7 (H) 03/23/2022 0955   BASOSABS 0.0 09/06/2023 0929   BASOSABS 0.1 03/23/2022 0955    CMP     Component Value Date/Time   NA 140 09/06/2023 0929   NA 139 08/19/2020 0000   K 4.4 09/06/2023 0929   CL 105 09/06/2023 0929   CO2 29 09/06/2023 0929   GLUCOSE 109 (H) 09/06/2023 0929   BUN 25 (H) 09/06/2023 0929   BUN 28 (A) 08/19/2020 0000   CREATININE 1.03 09/06/2023 0929   CREATININE 1.31 06/06/2021 1436   CALCIUM  9.0 09/06/2023 0929   PROT 6.3 09/06/2023 0929   ALBUMIN 4.1 09/06/2023 0929   AST 28 09/06/2023 0929   ALT 37 09/06/2023 0929   ALKPHOS 44 09/06/2023 0929   BILITOT 0.5 09/06/2023 0929   GFRNONAA 70 08/28/2016 1107   GFRAA 81 08/28/2016 1107     Assessment/Plan:   GERD Patient seen today for follow-up of GERD.  Has been on a PPI for 30+ years and has been unable to come off the medication without recurrence of symptoms in the past.  He underwent EGD 09/27/2023 with finding of salmon-colored mucosa suspicious for short segment Barrett's, though biopsies were negative.  Had gastric erosion with no bleeding and no stigmata of recent bleeding, biopsies showing mild reactive gastropathy.   He has some concerns about the long-term use of PPIs and would like to be able to wean off omeprazole  if possible.  Discussed weaning schedule as recommended by Dr. Legrand.  Will have him try to go down to omeprazole  20 mg once daily and if he does well with this, can discontinue omeprazole  and start famotidine 40 mg daily.  Otherwise, if symptoms return we will have him go back to omeprazole  40 mg.  Gave him some information on antireflux  surgery.  He is not necessarily interested in surgery at this time but would like more information. Overall doing well, requests to follow-up as needed, but does plan to give us  an update on how he does on decreased dose of omeprazole .  - Will send written prescription for omeprazole  20 mg, 2 tablets daily.  Advised patient to take 1 tablet daily and to go back to 2 tablets if recurrence of symptoms. - Advised him to contact us  in a couple weeks with an update - Follow-up as needed per patient request  Intermittent loose stools At last visit patient endorsed having some occasional loose stools and bloating about 1 or 2 times a month.  Had been on probiotics which she has since discontinued with no real change.  We did discuss SIBO breath test, but he did not want to proceed due to cost of the test.  Does seem that his symptoms are largely diet related, and he does well as long as he avoids foods high in lactose, and gluten.  Had negative celiac serologies following last visit (though has been on gluten-free diet for a long time).  Discussed empiric treatment for SIBO if  symptoms worsen which he is in agreement with and would like to continue to monitor for now.  - If worsening symptoms consider empiric treatment for IBS-D/SIBO with Xifaxan versus metronidazole   Camie Furbish, PA-C [facility] 10/28/2023, 11:09 AM  Patient Care Team: Catherine Charlies LABOR, DO as PCP - General (Family Medicine) [facility] as Consulting Physician (Optometry) Luke Orlan HERO, DO as Consulting Physician (Allergy ) Kassie Acquanetta Bradley, MD as Consulting Physician (Pulmonary Disease) Anner Alm ORN, MD as Consulting Physician (Cardiology)

## 2023-10-28 NOTE — Progress Notes (Signed)
 ____________________________________________________________  Attending physician addendum:  Thank you for sending this case to me. I have reviewed the entire note and agree with the plan.   Victory Brand, MD  ____________________________________________________________

## 2023-10-28 NOTE — Patient Instructions (Addendum)
 _______________________________________________________  If your blood pressure at your visit was 140/90 or greater, please contact your primary care physician to follow up on this.  _______________________________________________________  If you are age 67 or older, your body mass index should be between 23-30. Your Body mass index is 24.63 kg/m. If this is out of the aforementioned range listed, please consider follow up with your Primary Care Provider.  If you are age 39 or younger, your body mass index should be between 19-25. Your Body mass index is 24.63 kg/m. If this is out of the aformentioned range listed, please consider follow up with your Primary Care Provider.   ________________________________________________________  The [facility] providers would like to encourage you to use MYCHART to communicate with providers for non-urgent requests or questions.  Due to long hold times on the telephone, sending your provider a message by [facility] may be a faster and more efficient way to get a response.  Please allow 48 business hours for a response.  Please remember that this is for non-urgent requests.  _______________________________________________________  [facility] is using a team-based approach to care.  Your team is made up of your doctor and two to three APPS. Our APPS (Nurse Practitioners and Physician Assistants) work with your physician to ensure care continuity for you. They are fully qualified to address your health concerns and develop a treatment plan. They communicate directly with your gastroenterologist to care for you. Seeing the Advanced Practice Practitioners on your physician's team can help you by facilitating care more promptly, often allowing for earlier appointments, access to diagnostic testing, procedures, and other specialty referrals.    I appreciate the  opportunity to care for you  Thank You   Camie Heinz,PA-C

## 2023-11-02 ENCOUNTER — Encounter: Payer: Self-pay | Admitting: Allergy

## 2023-11-22 ENCOUNTER — Ambulatory Visit: Admitting: Family Medicine

## 2023-12-01 ENCOUNTER — Other Ambulatory Visit: Payer: Self-pay | Admitting: Sports Medicine

## 2023-12-01 DIAGNOSIS — M545 Low back pain, unspecified: Secondary | ICD-10-CM

## 2023-12-02 ENCOUNTER — Ambulatory Visit
Admission: RE | Admit: 2023-12-02 | Discharge: 2023-12-02 | Disposition: A | Discharge status: Home / Self Care | Source: Ambulatory Visit | Attending: Sports Medicine | Admitting: Sports Medicine

## 2023-12-02 ENCOUNTER — Other Ambulatory Visit: Payer: Self-pay | Admitting: Medical Genetics

## 2023-12-02 ENCOUNTER — Encounter: Payer: Self-pay | Admitting: Internal Medicine

## 2023-12-02 ENCOUNTER — Ambulatory Visit (INDEPENDENT_AMBULATORY_CARE_PROVIDER_SITE_OTHER): Admitting: Internal Medicine

## 2023-12-02 VITALS — BP 137/89 | HR 61 | Temp 97.3°F | Ht 66.0 in | Wt 157.0 lb

## 2023-12-02 DIAGNOSIS — Z006 Encounter for examination for normal comparison and control in clinical research program: Secondary | ICD-10-CM

## 2023-12-02 DIAGNOSIS — E538 Deficiency of other specified B group vitamins: Secondary | ICD-10-CM

## 2023-12-02 DIAGNOSIS — M545 Low back pain, unspecified: Secondary | ICD-10-CM

## 2023-12-02 DIAGNOSIS — I2510 Atherosclerotic heart disease of native coronary artery without angina pectoris: Secondary | ICD-10-CM | POA: Diagnosis not present

## 2023-12-02 DIAGNOSIS — R030 Elevated blood-pressure reading, without diagnosis of hypertension: Secondary | ICD-10-CM

## 2023-12-02 DIAGNOSIS — Z23 Encounter for immunization: Secondary | ICD-10-CM | POA: Diagnosis not present

## 2023-12-02 DIAGNOSIS — J4540 Moderate persistent asthma, uncomplicated: Secondary | ICD-10-CM

## 2023-12-02 DIAGNOSIS — E785 Hyperlipidemia, unspecified: Secondary | ICD-10-CM

## 2023-12-02 NOTE — Progress Notes (Signed)
 New Patient Office Visit     CC/Reason for Visit: Establish care, discuss chronic concerns Previous PCP: Charlies Bellini, DO   HPI: Eric [name] is a 68 y.o. male who is coming in today for the above mentioned reasons. Past Medical History is significant for: Exercise-induced asthma, coronary artery disease, hyperlipidemia, GERD.  He is here today to establish care.  He does not smoke, drinks about 1 to 2 glasses of wine a night, no allergies, no significant past surgical history.  Very strong family history of coronary artery disease especially on his father's side.  He is retired, married has 4 children.  Requesting a flu vaccine.  He has been dealing with what sounds like some lower back pain with left-sided sciatica.  Has had several rounds of prednisone  and just had an MRI today and is awaiting results.  Has not yet done physical therapy.   Past Medical/Surgical History: Past Medical History:  Diagnosis Date   Allergies    Allergy  1976   Asthma    Bronchitis due to COVID-19 virus 12/09/2021   Chicken pox    Chronic rhinitis 07/22/2021   Coronary artery disease, non-occlusive    Elevated Coronary Calcium  Score - Non-obstructive CAD (30-50%) on Coronary CTA   Dietary counseling and surveillance 07/22/2021   Dislocation of shoulder, left, closed    Eczema    Ganglion cyst 09/17/2010   GERD (gastroesophageal reflux disease)    Gluten intolerance    Leads to diffuse GI issues   Heart disease    Hyperlipidemia with target LDL less than 70 07/23/2016   Kidney disease    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   Coronary CT Angiograpm  July-August 2018   1) Calcium  score 147 which is 74th percentile for age and sex 2) Right dominant coronary arteries with Less than 30% calcified plaque in proximal LAD. Less  than 50% calcified plaque in proximal RCA, Less than 30% soft plauqe in mid RCA and Less than 30% calcified plaque in distal RCA   ELBOW SURGERY Left    repair tendon   MOUTH SURGERY  1965   TOE SURGERY  2010   TONSILLECTOMY  1962   VASECTOMY      Social History:  reports that he quit smoking about 52 years ago. His smoking use included cigarettes. He started smoking about 54 years ago. He has a 2 pack-year smoking history. He has never been exposed to tobacco smoke. He has never used smokeless tobacco. He reports current alcohol use of about 7.0 standard drinks of alcohol per week. He reports that he does not use drugs.  Allergies: Allergies  Allergen Reactions   Gluten Meal Diarrhea and Other (See Comments)    GI   Other Other (See Comments)    Other Reaction(s): Eye Irritation    sinus problems  Legumes    Family History:  Family History  Problem Relation Age of Onset   Hypertension Mother    Breast cancer Mother    Hearing loss Mother    Hyperlipidemia Father    Hypertension Father    Dementia Father    Heart disease Father    Prostate cancer Father    Heart attack Father        Paternal uncle also had MI  Depression Father    Obesity Father    Alcohol abuse Sister    Allergic rhinitis Brother    Hypertension Brother    Alcohol abuse Brother    Coronary artery disease Paternal Uncle    Heart attack Paternal Uncle    Heart attack Paternal Uncle    Coronary artery disease Paternal Uncle    Heart disease Paternal Grandfather    Angioedema Other    Eczema Other    Colon cancer Neg Hx    Colon polyps Neg Hx    Pancreatic cancer Neg Hx    Rectal cancer Neg Hx    Stomach cancer Neg Hx    Esophageal cancer Neg Hx      Current Outpatient Medications:    albuterol  (VENTOLIN  HFA) 108 (90 Base) MCG/ACT inhaler, Inhale 2 puffs into the lungs every 4 (four) hours as needed for wheezing or shortness of breath (coughing fits)., Disp: 18 g, Rfl: 1   ASPIRIN 81 PO,  Take 1 tablet by mouth daily as needed., Disp: , Rfl:    atorvastatin  (LIPITOR) 80 MG tablet, Take 1 tablet (80 mg total) by mouth at bedtime., Disp: 90 tablet, Rfl: 1   clotrimazole-betamethasone (LOTRISONE) cream, Apply 1 Application topically as needed., Disp: , Rfl:    fluticasone  (FLONASE  ALLERGY  RELIEF) 50 MCG/ACT nasal spray, Place 1 spray into both nostrils as needed., Disp: , Rfl:    gabapentin (NEURONTIN) 300 MG capsule, Take 300 mg by mouth 3 (three) times daily., Disp: , Rfl:    meloxicam (MOBIC) 7.5 MG tablet, Take 7.5 mg by mouth 2 (two) times daily., Disp: , Rfl:    methylPREDNISolone (MEDROL DOSEPAK) 4 MG TBPK tablet, Take by mouth., Disp: , Rfl:    omeprazole  (PRILOSEC) 20 MG capsule, Take 2 capsules (40 mg total) by mouth every morning., Disp: 180 capsule, Rfl: 3   terbinafine (LAMISIL) 250 MG tablet, Take 250 mg by mouth daily., Disp: , Rfl:    b complex vitamins capsule, Take 1 capsule by mouth daily., Disp: , Rfl:    fluticasone -salmeterol (ADVAIR DISKUS) 100-50 MCG/ACT AEPB, 1 puff twice a day and rinse mouth after each use. May decrease Advair to 1 puff once a day. If you notice worsening breathing issues then go back up to 1 puff twice a day. (Patient not taking: Reported on 12/02/2023), Disp: 60 each, Rfl: 1  Review of Systems:  Negative except as indicated in HPI.   Physical Exam: Vitals:   12/02/23 1432 12/02/23 1452  BP: (!) 160/110 137/89  Pulse: 61   Temp: (!) 97.3 F (36.3 C)   TempSrc: Oral   SpO2: 99%   Weight: 157 lb (71.2 kg)   Height: 5' 6 (1.676 m)    Body mass index is 25.34 kg/m.  Physical Exam Vitals reviewed.  Constitutional:      Appearance: Normal appearance.  HENT:     Head: Normocephalic and atraumatic.  Eyes:     Conjunctiva/sclera: Conjunctivae normal.  Cardiovascular:     Rate and Rhythm: Normal rate and regular rhythm.  Pulmonary:     Effort: Pulmonary effort is normal.     Breath sounds: Normal breath sounds.  Skin:     General: Skin is warm and dry.  Neurological:     General: No focal deficit present.     Mental Status: He is alert and oriented to person, place, and time.  Psychiatric:        Mood and Affect: Mood normal.  Behavior: Behavior normal.        Thought Content: Thought content normal.        Judgment: Judgment normal.       Impression and Plan:  Flu vaccine need -     Flu vaccine HIGH DOSE PF(Fluzone Trivalent)  Hyperlipidemia with target LDL less than 70  Coronary artery disease, non-occlusive  B12 deficiency  Moderate persistent asthma without complication  Elevated blood pressure reading  Immunization due  -Flu vaccine given in office today. - Continue statin, check lipids when he returns for annual physical. - Blood pressure is noted to be elevated today.  He will do ambulatory blood pressure monitoring and return for follow-up. - Asthma is currently well-controlled not on any therapy.  Time spent: 46 minutes reviewing chart, interviewing patient and formulating plan of care.      Eric Theophilus Andrews, MD [facility]

## 2023-12-06 ENCOUNTER — Other Ambulatory Visit: Payer: Self-pay

## 2023-12-07 ENCOUNTER — Encounter: Payer: Self-pay | Admitting: Gastroenterology

## 2023-12-07 ENCOUNTER — Ambulatory Visit (INDEPENDENT_AMBULATORY_CARE_PROVIDER_SITE_OTHER): Admitting: Gastroenterology

## 2023-12-07 VITALS — BP 110/72 | HR 58 | Ht 65.0 in | Wt 158.5 lb

## 2023-12-07 DIAGNOSIS — K219 Gastro-esophageal reflux disease without esophagitis: Secondary | ICD-10-CM | POA: Diagnosis not present

## 2023-12-07 NOTE — Patient Instructions (Signed)
 Thank you for trusting me with your gastrointestinal care!    Dr. Victory Legrand DOUGLAS [facility]

## 2023-12-07 NOTE — Progress Notes (Signed)
 [facility] Progress Note  Chief Complaint: GERD  Subjective  Prior history  Decades of GERD symptoms with regurgitation and pyrosis requiring long-term acid suppression therapy with PPI.  Clinical details outlined in office notes of last several months. August 2025 EGD with variable Z-line and 2 cm tongue of salmon-colored mucosa but negative for Barrett's.  No esophagitis seen (while on high-dose PPI) Initial postprocedure medicine recommendations: Recommend initially decreasing the current omeprazole  40 mg daily to 20 mg once daily (he is currently taking the 20 mg tablets decrease to 1 of those tablets daily) . If heartburn worsens increase back to 40 mg once daily. If there is not an increase in the heartburn after the decrease to 20 mg once daily, then discontinue omeprazole  and start taking famotidine 40 mg once daily (two the OTC 20 mg tablets). Next available clinic visit to see me regarding reflux management. We can discuss medication management as well as possibility of antireflux procedure.  Follow-up clinic appointment with APP September 2025, patient wished to continue PPI but had some concerns about long-term use of this.  Possibility of antireflux procedure discussed.  Also intermittent loose stool he relates largely to dietary triggers.   Discussed the use of AI scribe software for clinical note transcription with the patient, who gave verbal consent to proceed.  History of Present Illness Eric [name] is a 68 year old male with gastroesophageal reflux disease (GERD) who presents for follow-up regarding his medication management.  He has been managing GERD with omeprazole , initially at a higher dose, but recently reduced to 20 mg once daily. No issues have arisen since reducing the dose, although he is concerned that anti-inflammatory medications taken for a ruptured L4-L5 disc might be masking symptoms. He has a history of severe reflux requiring high-dose  medication, but recent endoscopy findings were reassuring with no evidence of Barrett's esophagus, strictures, or esophagitis.  He has been on various antacid medications for a long time and is considering the long-term implications of continued use. He has discussed potential issues with calcium  absorption and bone health related to long-term antacid use, and his vitamin D  and B12 levels have been checked. His B12 levels were low, and he has been taking B12 supplements without issues. He previously took a B complex but stopped due to concerns about its impact on his exercise-induced asthma, which has improved since discontinuing the B complex.  He has a history of gluten intolerance diagnosed around age 35, which resolved with dietary changes. He has experienced periodic diarrhea and stomach issues, which he attributes to his diet and medication use. He previously used probiotics but stopped after consulting with a PA and reports feeling better since discontinuing them.  He also has a potential concerns about connection between PPIs and dementia, especially because his father had dementia.   ROS: Cardiovascular:  no chest pain Respiratory: no dyspnea  The patient's Past Medical, Family and Social History were reviewed and are on file in the EMR. Past Medical History:  Diagnosis Date   Allergies    Allergy  1976   Asthma    Bronchitis due to COVID-19 virus 12/09/2021   Chicken pox    Chronic rhinitis 07/22/2021   Coronary artery disease, non-occlusive    Elevated Coronary Calcium  Score - Non-obstructive CAD (30-50%) on Coronary CTA   Dietary counseling and surveillance 07/22/2021   Dislocation of shoulder, left, closed    Eczema    Ganglion cyst 09/17/2010   GERD (gastroesophageal  reflux disease)    Gluten intolerance    Leads to diffuse GI issues   Heart disease    Hyperlipidemia with target LDL less than 70 07/23/2016   Kidney disease    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   Coronary CT Angiograpm  July-August 2018   1) Calcium  score 147 which is 74th percentile for age and sex 2) Right dominant coronary arteries with Less than 30% calcified plaque in proximal LAD. Less than 50% calcified plaque in proximal RCA, Less than 30% soft plauqe in mid RCA and Less than 30% calcified plaque in distal RCA   ELBOW SURGERY Left    repair tendon   MOUTH SURGERY  1965   TOE SURGERY  2010   TONSILLECTOMY  1962   VASECTOMY       Objective:  Med list reviewed  Current Outpatient Medications:    albuterol  (VENTOLIN  HFA) 108 (90 Base) MCG/ACT inhaler, Inhale 2 puffs into the lungs every 4 (four) hours as needed for wheezing or shortness of breath (coughing fits)., Disp: 18 g, Rfl: 1   ASPIRIN 81 PO, Take 1 tablet by mouth daily as needed., Disp: , Rfl:    atorvastatin  (LIPITOR) 80 MG tablet, Take 1 tablet (80 mg total) by mouth at bedtime., Disp: 90 tablet, Rfl: 1   clindamycin (CLEOCIN T) 1 % lotion, Apply topically daily., Disp: , Rfl:    cyclobenzaprine (FLEXERIL) 10 MG tablet, SMARTSIG:0.5-1 Tablet(s) By Mouth 2-3 Times Daily PRN, Disp: , Rfl:    fluticasone  (FLONASE  ALLERGY  RELIEF) 50 MCG/ACT nasal spray, Place 1 spray into both nostrils as needed., Disp: , Rfl:    gabapentin (NEURONTIN) 300 MG capsule, Take 300 mg by mouth 3 (three) times daily., Disp: , Rfl:    meloxicam (MOBIC) 7.5 MG tablet, Take 7.5 mg by mouth 2 (two) times daily., Disp: , Rfl:    omeprazole  (PRILOSEC) 20 MG capsule, Take 2 capsules (40 mg total) by mouth every morning., Disp: 180 capsule, Rfl: 3   terbinafine (LAMISIL) 250 MG tablet, Take 250 mg by mouth daily., Disp: , Rfl:    Vital signs in last 24 hrs: Vitals:   12/07/23 0913  BP: 110/72  Pulse: (!) 58   Wt Readings from Last 3 Encounters:  12/07/23 158 lb 8 oz  (71.9 kg)  12/02/23 157 lb (71.2 kg)  10/28/23 148 lb (67.1 kg)    Physical Exam  No exam-entire visit spent in review of symptoms, results and plan   Labs:   ___________________________________________ Radiologic studies:   ____________________________________________ Other: Biopsies from August 2025 EGD        1. Surgical [P], gastric biopsies :       - MILD REACTIVE GASTROPATHY.       - NEGATIVE FOR H. PYLORI ON H&E STAIN       - NO INTESTINAL METAPLASIA, DYSPLASIA, OR MALIGNANCY.        2. Surgical [P], distal esophagus :       - BENIGN SQUAMOUS MUCOSA WITH MILD REACTIVE CHANGES       - NEGATIVE FOR INTESTINAL METAPLASIA, DYSPLASIA OR MALIGNANCY   _____________________________________________   Encounter Diagnosis  Name Primary?   Gastroesophageal reflux disease without esophagitis Yes    Assessment and Plan Assessment & Plan Gastroesophageal reflux disease (GERD)  without esophagitis GERD controlled on omeprazole  20 mg daily. Previous endoscopy normal. Discussed PPI risks: calcium , bone health, B12, iron absorption. No clear evidence linking PPIs to dementia. Discussed H2 blockers and TIF as alternatives. - Continue omeprazole  20 mg once daily. - Monitor vitamin B12, iron, and vitamin D  levels regularly. - Consider bone density study due to long-term PPI use and age. - Discuss potential TIF procedure with Dr. Sandor Flatter if interested in surgical options. - No routine endoscopy needed unless significant change in condition occurs. He will consider it further and let us  know if he wishes a consultation with Dr. Flatter Vitamin B12 deficiency B12 deficiency likely due to PPI use. Exercise-induced asthma linked to B complex supplementation resolved with straight B12. - Continue vitamin B12 supplementation. - Monitor B12 levels regularly.   I have reviewed the indications, risks, and benefits of PPI therapy with the patient today. I have discussed studies  that raise ? of increased osteoporosis, and kidney failure and explained that these studies show very weak associations of unclear significance and not clear cause and effect. We did discuss the potential for vitamin malabsorption, to include magnesium (very rare), calcium  (easily modifiable with Calcium  Citrate supplement), vitamin B12 (again, correctable with oral B12 supplement), and iron (although rarely clinically significant outside patients who require iron supplementation previously), and can monitor each of these periodically with routine labs. We have agreed to continue PPI treatment in this case.   20 minutes were spent on this encounter (including chart review, history/exam, counseling/coordination of care, and documentation) > 50% of that time was spent on counseling and coordination of care.   Victory LITTIE Brand III

## 2023-12-08 ENCOUNTER — Encounter: Payer: Self-pay | Admitting: Internal Medicine

## 2023-12-08 DIAGNOSIS — M545 Low back pain, unspecified: Secondary | ICD-10-CM

## 2023-12-26 ENCOUNTER — Encounter: Payer: Self-pay | Admitting: Internal Medicine

## 2023-12-27 ENCOUNTER — Other Ambulatory Visit: Payer: Self-pay

## 2023-12-27 MED ORDER — ATORVASTATIN CALCIUM 80 MG PO TABS: 80.0000 mg | ORAL_TABLET | Freq: Every day | ORAL | 1 refills | Status: AC

## 2024-01-10 NOTE — Progress Notes (Signed)
 Referring Physician:  Theophilus Andrews, Tully GRADE, MD [address] [place],  [place] [address]  Primary Physician:  Theophilus Andrews, Tully GRADE, MD  History of Present Illness: 01/17/2024 [name] is here today with a chief complaint of low back pain radiating to his left lower extremity.  This started approximately 2-1/2 months ago after rowing and going to the chiropractor.  Thankfully, his pain has improved very much.  Initially was going down the back of his leg, wrapping around the outside of his calf into the top of his foot.  At this point the pain has stopped, but he does have some pressure around his ankle.  He is walking again and is looking forward to rowing again as well.  He has noticed that his left foot is weak however especially when trying to go up and down the stairs and Trail running.  No new saddle anesthesia or incontinence.  Duration: 2 1/2 months Severity: 2/10  Precipitating: aggravated by bending, lifting Modifying factors: made better by sitting, heat Weakness: none Timing: constant Bowel/Bladder Dysfunction: none  Conservative measures:  Physical therapy: has not participated in Multimodal medical therapy including regular antiinflammatories: Medrol pak, cyclobenzaprine, meloxicam, gabapentin, tylenol Injections:  12/13/2023 Lumbar Transforaminal (helped about 1 week)  Past Surgery: no spinal surgeries  LESTON SCHUELLER has no symptoms of cervical myelopathy.  The symptoms are causing a significant impact on the patient's life.   Review of Systems:  A 10 point review of systems is negative, except for the pertinent positives and negatives detailed in the HPI.  Past Medical History: Past Medical History:  Diagnosis Date   Allergies    Allergy  1976   Asthma    Bronchitis due to COVID-19 virus 12/09/2021   Chicken pox    Chronic rhinitis 07/22/2021   Coronary artery disease, non-occlusive    Elevated Coronary Calcium  Score -  Non-obstructive CAD (30-50%) on Coronary CTA   Dietary counseling and surveillance 07/22/2021   Dislocation of shoulder, left, closed    Eczema    Ganglion cyst 09/17/2010   GERD (gastroesophageal reflux disease)    Gluten intolerance    Leads to diffuse GI issues   Heart disease    Hyperlipidemia with target LDL less than 70 07/23/2016   Kidney disease    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   Coronary CT Angiograpm  July-August 2018   1) Calcium  score 147 which is 74th percentile for age and sex 2) Right dominant coronary arteries with Less than 30% calcified plaque in proximal LAD. Less than 50% calcified plaque in proximal RCA, Less than 30% soft plauqe in mid RCA and Less than 30% calcified plaque in distal RCA   ELBOW SURGERY Left    repair tendon   MOUTH SURGERY  1965   TOE SURGERY  2010   TONSILLECTOMY  1962   VASECTOMY      Allergies: Allergies as of 01/17/2024 - Review Complete 01/13/2024  Allergen Reaction Noted   Gluten meal Diarrhea and Other (See Comments) 11/27/2015   Other Other (See Comments) 12/12/2015    Medications: Outpatient Encounter Medications as of 01/17/2024  Medication Sig   albuterol  (VENTOLIN  HFA) 108 (90 Base) MCG/ACT inhaler Inhale 2 puffs into the lungs every 4 (four) hours as  needed for wheezing or shortness of breath (coughing fits).   ASPIRIN 81 PO Take 1 tablet by mouth daily as needed.   atorvastatin  (LIPITOR) 80 MG tablet Take 1 tablet (80 mg total) by mouth at bedtime.   fluticasone  (FLONASE  ALLERGY  RELIEF) 50 MCG/ACT nasal spray Place 1 spray into both nostrils as needed.   omeprazole  (PRILOSEC) 20 MG capsule Take 2 capsules (40 mg total) by mouth every morning.   [DISCONTINUED] clindamycin (CLEOCIN T) 1 % lotion Apply topically daily.    [DISCONTINUED] cyclobenzaprine (FLEXERIL) 10 MG tablet SMARTSIG:0.5-1 Tablet(s) By Mouth 2-3 Times Daily PRN   [DISCONTINUED] gabapentin (NEURONTIN) 300 MG capsule Take 300 mg by mouth 3 (three) times daily.   [DISCONTINUED] meloxicam (MOBIC) 7.5 MG tablet Take 7.5 mg by mouth 2 (two) times daily.   [DISCONTINUED] terbinafine (LAMISIL) 250 MG tablet Take 250 mg by mouth daily.   No facility-administered encounter medications on file as of 01/17/2024.    Social History: Social History   Tobacco Use   Smoking status: Former    Current packs/day: 0.00    Average packs/day: 1 pack/day for 2.0 years (2.0 ttl pk-yrs)    Types: Cigarettes    Start date: 07/03/1969    Quit date: 07/04/1971    Years since quitting: 52.5    Passive exposure: Never   Smokeless tobacco: Never  Vaping Use   Vaping status: Never Used  Substance Use Topics   Alcohol use: Yes    Alcohol/week: 7.0 standard drinks of alcohol    Types: 7 Glasses of wine per week    Comment: occasional   Drug use: No    Family Medical History: Family History  Problem Relation Age of Onset   Hypertension Mother    Breast cancer Mother    Hearing loss Mother    Hyperlipidemia Father    Hypertension Father    Dementia Father    Heart disease Father    Prostate cancer Father    Heart attack Father        Paternal uncle also had MI   Depression Father    Obesity Father    Alcohol abuse Sister    Allergic rhinitis Brother    Hypertension Brother    Alcohol abuse Brother    Coronary artery disease Paternal Uncle    Heart attack Paternal Uncle    Heart attack Paternal Uncle    Coronary artery disease Paternal Uncle    Heart disease Paternal Grandfather    Angioedema Other    Eczema Other    Colon cancer Neg Hx    Colon polyps Neg Hx    Pancreatic cancer Neg Hx    Rectal cancer Neg Hx    Stomach cancer Neg Hx    Esophageal cancer Neg Hx     Physical Examination: @VITALWITHPAIN @  General: Patient is well developed,  well nourished, calm, collected, and in no apparent distress. Attention to examination is appropriate.  Psychiatric: Patient is non-anxious.  Head:  Pupils equal, round, and reactive to light.  ENT:  Oral mucosa appears well hydrated.  Neck:   Supple.  Full range of motion.  Respiratory: Patient is breathing without any difficulty.  Extremities: No edema.  Vascular: Palpable dorsal pedal pulses.  Skin:   On exposed skin, there are no abnormal skin lesions.  NEUROLOGICAL:     Awake, alert, oriented to person, place, and time.  Speech is clear and fluent. Fund of knowledge is appropriate.   Cranial Nerves: Pupils equal round and  reactive to light.  Facial tone is symmetric.   ROM of spine: Nontender to palpation of paraspinals.  Strength:  Side Iliopsoas Quads Hamstring PF DF EHL  R 5 5 5 5 5 5   L 4 5 5 5  4- 4   Reflexes are 2+ at bilateral patella, trace Achilles bilaterally.  No ankle clonus. Some pain with internal rotation of left hip. Gait is normal.   No difficulty with tandem gait.   No evidence of dysmetria noted.  Medical Decision Making  Imaging: EXAM: MRI LUMBAR SPINE 12/02/2023 01:56:38 PM   TECHNIQUE: Multiplanar multisequence MRI of the lumbar spine was performed without the administration of intravenous contrast.   COMPARISON: Limited comparison made with chest radiographs 12/08/2021.   CLINICAL HISTORY: Sciatica since 10/2019, pain in left buttock down to foot.   FINDINGS:   BONES AND ALIGNMENT: 5 lumbar type vertebral bodies are assumed. Normal alignment. Normal vertebral body heights. Bone marrow signal is unremarkable.   SPINAL CORD: The conus terminates normally.   SOFT TISSUES: No paraspinal mass. Distention of the urinary bladder noted.   T11-T12: Mild disc bulging without resulting spinal stenosis or significant foraminal narrowing.   T12-L1: Mild disc bulging without resulting spinal stenosis or significant  foraminal narrowing.   L1-L2: Preserved disc height and hydration. No spinal stenosis or foraminal narrowing.   L2-L3: Mild disc desiccation with a central annular fissure and mild facet hypertrophy. There is minimal stenosis or significant foraminal narrowing.   L3-L4: Preserved disc height with mild disc bulging and mild facet hypertrophy. No spinal stenosis or significant foraminal narrowing.   L4-L5: Mild loss of disc height with annular disc bulging and a moderate-to-large disc extrusion with cranial migration into the left L4 lateral recess. There is mass effect on the thecal sac with impingement on the left L4 nerve root. Mild bilateral facet hypertrophy.   L5-S1: Preserved disc height and hydration. No spinal stenosis orsignificant foraminal narrowing.   IMPRESSION: 1. 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 2. Mild spondylosis at the additional levels as described. No other significant spinal stenosis or nerve root impingement.  I have personally reviewed the images and agree with the above interpretation.  Assessment and Plan: Mr. Yim is a pleasant 68 y.o. male with an improving left L4-5 radiculopathy suffered approximately 2-1/2 months ago with residual left foot weakness.  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  He has been left with some left dorsiflexion weakness.  He starts physical therapy this week.  Discussed with patient my concerns over his left foot weakness.  He would like to try physical therapy before going forward with any surgical intervention.  Plan includes the following:  -X-rays today to include flexion-extension to evaluate for listhesis - Patient plans to complete 6 weeks of physical therapy.  Will have him set up to see the surgeon once this is complete. - Patient also had some pain with  internal rotation of his left hip.  Will obtain x-rays today while he is in clinic.    Thank you for involving me in the care of this patient.    Lyle Decamp, PA-C Dept. of Neurosurgery

## 2024-01-13 ENCOUNTER — Ambulatory Visit: Admitting: Internal Medicine

## 2024-01-13 ENCOUNTER — Encounter: Payer: Self-pay | Admitting: Internal Medicine

## 2024-01-13 VITALS — BP 128/78 | HR 67 | Temp 97.7°F | Ht 67.0 in | Wt 164.3 lb

## 2024-01-13 DIAGNOSIS — Z8042 Family history of malignant neoplasm of prostate: Secondary | ICD-10-CM

## 2024-01-13 DIAGNOSIS — R030 Elevated blood-pressure reading, without diagnosis of hypertension: Secondary | ICD-10-CM | POA: Diagnosis not present

## 2024-01-13 DIAGNOSIS — R7303 Prediabetes: Secondary | ICD-10-CM | POA: Diagnosis not present

## 2024-01-13 DIAGNOSIS — E785 Hyperlipidemia, unspecified: Secondary | ICD-10-CM | POA: Diagnosis not present

## 2024-01-13 DIAGNOSIS — E538 Deficiency of other specified B group vitamins: Secondary | ICD-10-CM | POA: Diagnosis not present

## 2024-01-13 DIAGNOSIS — Z125 Encounter for screening for malignant neoplasm of prostate: Secondary | ICD-10-CM | POA: Diagnosis not present

## 2024-01-13 DIAGNOSIS — N400 Enlarged prostate without lower urinary tract symptoms: Secondary | ICD-10-CM | POA: Diagnosis not present

## 2024-01-13 LAB — LIPID PANEL
Cholesterol: 172 mg/dL (ref 0–200)
HDL: 68.6 mg/dL (ref >39.00)
LDL Cholesterol: 88 mg/dL (ref 0–99)
NonHDL: 103.18
Total CHOL/HDL Ratio: 3
Triglycerides: 74 mg/dL (ref 0.0–149.0)
VLDL: 14.8 mg/dL (ref 0.0–40.0)

## 2024-01-13 LAB — COMPREHENSIVE METABOLIC PANEL WITH GFR
ALT: 75 U/L — ABNORMAL HIGH (ref 0–53)
AST: 49 U/L — ABNORMAL HIGH (ref 0–37)
Albumin: 4.4 g/dL (ref 3.5–5.2)
Alkaline Phosphatase: 56 U/L (ref 39–117)
BUN: 29 mg/dL — ABNORMAL HIGH (ref 6–23)
CO2: 30 meq/L (ref 19–32)
Calcium: 9.4 mg/dL (ref 8.4–10.5)
Chloride: 103 meq/L (ref 96–112)
Creatinine, Ser: 1.16 mg/dL (ref 0.40–1.50)
GFR: 65.05 mL/min (ref >60.00)
Glucose, Bld: 113 mg/dL — ABNORMAL HIGH (ref 70–99)
Potassium: 4.5 meq/L (ref 3.5–5.1)
Sodium: 139 meq/L (ref 135–145)
Total Bilirubin: 0.6 mg/dL (ref 0.2–1.2)
Total Protein: 6.6 g/dL (ref 6.0–8.3)

## 2024-01-13 LAB — CBC WITH DIFFERENTIAL/PLATELET
Basophils Absolute: 0.1 K/uL (ref 0.0–0.1)
Basophils Relative: 1.5 % (ref 0.0–3.0)
Eosinophils Absolute: 0.6 K/uL (ref 0.0–0.7)
Eosinophils Relative: 10.5 % — ABNORMAL HIGH (ref 0.0–5.0)
HCT: 45.3 % (ref 39.0–52.0)
Hemoglobin: 15.2 g/dL (ref 13.0–17.0)
Lymphocytes Relative: 39.6 % (ref 12.0–46.0)
Lymphs Abs: 2.1 K/uL (ref 0.7–4.0)
MCHC: 33.6 g/dL (ref 30.0–36.0)
MCV: 86.9 fl (ref 78.0–100.0)
Monocytes Absolute: 0.4 K/uL (ref 0.1–1.0)
Monocytes Relative: 8.1 % (ref 3.0–12.0)
Neutro Abs: 2.2 K/uL (ref 1.4–7.7)
Neutrophils Relative %: 40.3 % — ABNORMAL LOW (ref 43.0–77.0)
Platelets: 290 K/uL (ref 150.0–400.0)
RBC: 5.21 Mil/uL (ref 4.22–5.81)
RDW: 14.3 % (ref 11.5–15.5)
WBC: 5.4 K/uL (ref 4.0–10.5)

## 2024-01-13 LAB — VITAMIN B12: Vitamin B-12: 983 pg/mL — ABNORMAL HIGH (ref 211–911)

## 2024-01-13 LAB — HEMOGLOBIN A1C: Hgb A1c MFr Bld: 5.7 % (ref 4.6–6.5)

## 2024-01-13 LAB — PSA: PSA: 1.46 ng/mL (ref 0.10–4.00)

## 2024-01-13 NOTE — Progress Notes (Signed)
 Established Patient Office Visit     CC/Reason for Visit: Follow-up chronic conditions  HPI: Eric [name] is a 68 y.o. male who is coming in today for the above mentioned reasons. Past Medical History is significant for: Hyperlipidemia, impaired glucose tolerance, B12 deficiency, moderate asthma, nonobstructive coronary artery disease.  He has a strong family history of prostate cancer and is interested in screening today.   Past Medical/Surgical History: Past Medical History:  Diagnosis Date   Allergies    Allergy  1976   Asthma    Bronchitis due to COVID-19 virus 12/09/2021   Chicken pox    Chronic rhinitis 07/22/2021   Coronary artery disease, non-occlusive    Elevated Coronary Calcium  Score - Non-obstructive CAD (30-50%) on Coronary CTA   Dietary counseling and surveillance 07/22/2021   Dislocation of shoulder, left, closed    Eczema    Ganglion cyst 09/17/2010   GERD (gastroesophageal reflux disease)    Gluten intolerance    Leads to diffuse GI issues   Heart disease    Hyperlipidemia with target LDL less than 70 07/23/2016   Kidney disease    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   Coronary CT Angiograpm  July-August 2018   1) Calcium  score 147 which is 74th percentile for age and sex 2) Right dominant coronary arteries with Less than 30% calcified plaque in proximal LAD. Less than 50% calcified plaque in proximal RCA, Less than 30% soft plauqe in mid RCA and Less than 30% calcified plaque in distal RCA   ELBOW SURGERY Left    repair tendon   MOUTH SURGERY  1965   TOE SURGERY  2010   TONSILLECTOMY  1962   VASECTOMY      Social History:  reports that he quit smoking about 52 years ago. His smoking use included cigarettes. He started smoking about 54 years ago. He has a 2 pack-year  smoking history. He has never been exposed to tobacco smoke. He has never used smokeless tobacco. He reports current alcohol use of about 7.0 standard drinks of alcohol per week. He reports that he does not use drugs.  Allergies: Allergies[1]  Family History:  Family History  Problem Relation Age of Onset   Hypertension Mother    Breast cancer Mother    Hearing loss Mother    Hyperlipidemia Father    Hypertension Father    Dementia Father    Heart disease Father    Prostate cancer Father    Heart attack Father        Paternal uncle also had MI   Depression Father    Obesity Father    Alcohol abuse Sister    Allergic rhinitis Brother    Hypertension Brother    Alcohol abuse Brother    Coronary artery disease Paternal Uncle    Heart attack Paternal Uncle    Heart attack Paternal Uncle    Coronary artery disease Paternal Uncle    Heart disease Paternal Grandfather    Angioedema Other    Eczema Other    Colon cancer Neg Hx    Colon polyps Neg Hx    Pancreatic cancer Neg Hx    Rectal cancer Neg Hx    Stomach cancer Neg Hx    Esophageal cancer Neg Hx  Current Medications[2]  Review of Systems:  Negative unless indicated in HPI.   Physical Exam: Vitals:   01/13/24 0709 01/13/24 0712  BP: 130/80 128/78  Pulse: 67   Temp: 97.7 F (36.5 C)   TempSrc: Oral   SpO2: 96%   Weight: 164 lb 4.8 oz (74.5 kg)   Height: 5' 7 (1.702 m)     Body mass index is 25.73 kg/m.   Physical Exam Vitals reviewed.  Constitutional:      General: He is not in acute distress.    Appearance: Normal appearance. He is not ill-appearing, toxic-appearing or diaphoretic.  HENT:     Head: Normocephalic.     Right Ear: Tympanic membrane, ear canal and external ear normal. There is no impacted cerumen.     Left Ear: Tympanic membrane, ear canal and external ear normal. There is no impacted cerumen.     Nose: Nose normal.     Mouth/Throat:     Mouth: Mucous membranes are moist.      Pharynx: Oropharynx is clear. No oropharyngeal exudate or posterior oropharyngeal erythema.  Eyes:     General: No scleral icterus.       Right eye: No discharge.        Left eye: No discharge.     Conjunctiva/sclera: Conjunctivae normal.     Pupils: Pupils are equal, round, and reactive to light.  Neck:     Vascular: No carotid bruit.  Cardiovascular:     Rate and Rhythm: Normal rate and regular rhythm.     Pulses: Normal pulses.     Heart sounds: Normal heart sounds.  Pulmonary:     Effort: Pulmonary effort is normal. No respiratory distress.     Breath sounds: Normal breath sounds.  Abdominal:     General: Abdomen is flat. Bowel sounds are normal.     Palpations: Abdomen is soft.  Genitourinary:    Prostate: Normal.  Musculoskeletal:        General: Normal range of motion.     Cervical back: Normal range of motion.  Skin:    General: Skin is warm and dry.  Neurological:     General: No focal deficit present.     Mental Status: He is alert and oriented to person, place, and time. Mental status is at baseline.  Psychiatric:        Mood and Affect: Mood normal.        Behavior: Behavior normal.        Thought Content: Thought content normal.        Judgment: Judgment normal.      Impression and Plan:  Hyperlipidemia with target LDL less than 70 -     CBC with Differential/Platelet; Future -     Comprehensive metabolic panel with GFR; Future -     Lipid panel; Future  B12 deficiency -     Vitamin B12; Future  Elevated blood pressure reading  Prediabetes -     Hemoglobin A1c; Future  Prostate cancer screening  Family history of prostate cancer in father  Benign prostatic hyperplasia without lower urinary tract symptoms -     PSA; Future  -Check lipids today. - Check vitamin B12. - Blood pressure is well-controlled today, he will continue ambulatory blood pressure measurements. - Check A1c given his history of impaired glucose tolerance. - Digital rectal  exam today without abnormalities, PSA today.   Time spent:32 minutes reviewing chart, interviewing and examining patient and formulating plan of care.  Tully Theophilus Andrews, MD [facility]     [1]  Allergies Allergen Reactions   Gluten Meal Diarrhea and Other (See Comments)    GI   Other Other (See Comments)    Other Reaction(s): Eye Irritation    sinus problems  Legumes  [2]  Current Outpatient Medications:    albuterol  (VENTOLIN  HFA) 108 (90 Base) MCG/ACT inhaler, Inhale 2 puffs into the lungs every 4 (four) hours as needed for wheezing or shortness of breath (coughing fits)., Disp: 18 g, Rfl: 1   ASPIRIN 81 PO, Take 1 tablet by mouth daily as needed., Disp: , Rfl:    atorvastatin  (LIPITOR) 80 MG tablet, Take 1 tablet (80 mg total) by mouth at bedtime., Disp: 90 tablet, Rfl: 1   clindamycin (CLEOCIN T) 1 % lotion, Apply topically daily., Disp: , Rfl:    cyclobenzaprine (FLEXERIL) 10 MG tablet, SMARTSIG:0.5-1 Tablet(s) By Mouth 2-3 Times Daily PRN, Disp: , Rfl:    fluticasone  (FLONASE  ALLERGY  RELIEF) 50 MCG/ACT nasal spray, Place 1 spray into both nostrils as needed., Disp: , Rfl:    gabapentin (NEURONTIN) 300 MG capsule, Take 300 mg by mouth 3 (three) times daily., Disp: , Rfl:    meloxicam (MOBIC) 7.5 MG tablet, Take 7.5 mg by mouth 2 (two) times daily., Disp: , Rfl:    omeprazole  (PRILOSEC) 20 MG capsule, Take 2 capsules (40 mg total) by mouth every morning., Disp: 180 capsule, Rfl: 3   terbinafine (LAMISIL) 250 MG tablet, Take 250 mg by mouth daily., Disp: , Rfl:

## 2024-01-17 ENCOUNTER — Ambulatory Visit: Admitting: Physician Assistant

## 2024-01-17 ENCOUNTER — Encounter: Admitting: Internal Medicine

## 2024-01-17 ENCOUNTER — Encounter: Payer: Self-pay | Admitting: Physician Assistant

## 2024-01-17 ENCOUNTER — Ambulatory Visit

## 2024-01-17 VITALS — BP 134/84 | Ht 67.0 in | Wt 161.0 lb

## 2024-01-17 DIAGNOSIS — M545 Low back pain, unspecified: Secondary | ICD-10-CM | POA: Diagnosis not present

## 2024-01-17 DIAGNOSIS — R29898 Other symptoms and signs involving the musculoskeletal system: Secondary | ICD-10-CM | POA: Diagnosis not present

## 2024-01-17 DIAGNOSIS — M25552 Pain in left hip: Secondary | ICD-10-CM

## 2024-01-17 DIAGNOSIS — M5416 Radiculopathy, lumbar region: Secondary | ICD-10-CM

## 2024-01-20 ENCOUNTER — Encounter: Payer: Self-pay | Admitting: Internal Medicine

## 2024-01-24 ENCOUNTER — Ambulatory Visit: Payer: Self-pay | Admitting: Internal Medicine

## 2024-01-24 DIAGNOSIS — R7401 Elevation of levels of liver transaminase levels: Secondary | ICD-10-CM

## 2024-01-31 ENCOUNTER — Ambulatory Visit (HOSPITAL_BASED_OUTPATIENT_CLINIC_OR_DEPARTMENT_OTHER)
Admission: RE | Admit: 2024-01-31 | Discharge: 2024-01-31 | Disposition: A | Discharge status: Home / Self Care | Source: Ambulatory Visit | Attending: Internal Medicine | Admitting: Internal Medicine

## 2024-01-31 DIAGNOSIS — R7401 Elevation of levels of liver transaminase levels: Secondary | ICD-10-CM | POA: Insufficient documentation

## 2024-02-17 ENCOUNTER — Ambulatory Visit: Payer: Self-pay | Admitting: Internal Medicine

## 2024-02-24 ENCOUNTER — Other Ambulatory Visit (INDEPENDENT_AMBULATORY_CARE_PROVIDER_SITE_OTHER)

## 2024-02-24 DIAGNOSIS — R7401 Elevation of levels of liver transaminase levels: Secondary | ICD-10-CM | POA: Diagnosis not present

## 2024-02-24 LAB — COMPREHENSIVE METABOLIC PANEL WITH GFR
ALT: 49 U/L (ref 3–53)
AST: 34 U/L (ref 5–37)
Albumin: 4.4 g/dL (ref 3.5–5.2)
Alkaline Phosphatase: 46 U/L (ref 39–117)
BUN: 31 mg/dL — ABNORMAL HIGH (ref 6–23)
CO2: 29 meq/L (ref 19–32)
Calcium: 9.4 mg/dL (ref 8.4–10.5)
Chloride: 104 meq/L (ref 96–112)
Creatinine, Ser: 1.17 mg/dL (ref 0.40–1.50)
GFR: 64.33 mL/min
Glucose, Bld: 108 mg/dL — ABNORMAL HIGH (ref 70–99)
Potassium: 4.8 meq/L (ref 3.5–5.1)
Sodium: 138 meq/L (ref 135–145)
Total Bilirubin: 0.9 mg/dL (ref 0.2–1.2)
Total Protein: 6.9 g/dL (ref 6.0–8.3)

## 2024-02-25 LAB — HEPATITIS PANEL, ACUTE
Hep A IgM: NONREACTIVE
Hep B C IgM: NONREACTIVE
Hepatitis B Surface Ag: NONREACTIVE
Hepatitis C Ab: NONREACTIVE

## 2024-03-01 ENCOUNTER — Ambulatory Visit: Admitting: Neurosurgery

## 2024-03-07 ENCOUNTER — Ambulatory Visit: Admitting: Cardiology

## 2024-03-07 ENCOUNTER — Encounter: Payer: Self-pay | Admitting: Cardiology

## 2024-03-07 VITALS — BP 130/82 | HR 56 | Ht 66.0 in | Wt 164.0 lb

## 2024-03-07 DIAGNOSIS — I77810 Thoracic aortic ectasia: Secondary | ICD-10-CM | POA: Diagnosis not present

## 2024-03-07 DIAGNOSIS — Z79899 Other long term (current) drug therapy: Secondary | ICD-10-CM | POA: Diagnosis not present

## 2024-03-07 DIAGNOSIS — I2510 Atherosclerotic heart disease of native coronary artery without angina pectoris: Secondary | ICD-10-CM | POA: Diagnosis not present

## 2024-03-07 DIAGNOSIS — E785 Hyperlipidemia, unspecified: Secondary | ICD-10-CM

## 2024-03-07 NOTE — Addendum Note (Signed)
 Addended by: JANIT GENI CROME on: 03/07/2024 11:06 AM   Modules accepted: Orders

## 2024-03-20 ENCOUNTER — Ambulatory Visit (HOSPITAL_COMMUNITY)

## 2024-04-17 ENCOUNTER — Ambulatory Visit: Admitting: Pharmacist

## 2024-04-25 ENCOUNTER — Ambulatory Visit: Admitting: Cardiology

## 2024-05-16 ENCOUNTER — Ambulatory Visit: Admitting: Internal Medicine

## 2024-09-27 ENCOUNTER — Encounter
